# Patient Record
Sex: Female | Born: 1940 | Hispanic: Yes | Smoking: Never smoker
Health system: Southern US, Community
[De-identification: ages and names within clinical notes are randomized; demographics above are authoritative.]

## PROBLEM LIST (undated history)

## (undated) DIAGNOSIS — D649 Anemia, unspecified: Secondary | ICD-10-CM

## (undated) DIAGNOSIS — N189 Chronic kidney disease, unspecified: Secondary | ICD-10-CM

## (undated) DIAGNOSIS — C55 Malignant neoplasm of uterus, part unspecified: Secondary | ICD-10-CM

## (undated) DIAGNOSIS — E119 Type 2 diabetes mellitus without complications: Secondary | ICD-10-CM

## (undated) HISTORY — PX: HIP SURGERY: SHX245

## (undated) HISTORY — DX: Malignant neoplasm of uterus, part unspecified: C55

---

## 2015-07-20 ENCOUNTER — Inpatient Hospital Stay (HOSPITAL_COMMUNITY): Payer: Medicaid Other

## 2015-07-20 ENCOUNTER — Inpatient Hospital Stay (HOSPITAL_COMMUNITY)
Admission: EM | Admit: 2015-07-20 | Discharge: 2015-07-25 | DRG: 690 | Disposition: A | Discharge status: Home / Self Care | Payer: Medicaid Other | Attending: Internal Medicine | Admitting: Internal Medicine

## 2015-07-20 ENCOUNTER — Encounter (HOSPITAL_COMMUNITY): Payer: Self-pay

## 2015-07-20 DIAGNOSIS — N390 Urinary tract infection, site not specified: Principal | ICD-10-CM

## 2015-07-20 DIAGNOSIS — N1330 Unspecified hydronephrosis: Secondary | ICD-10-CM | POA: Diagnosis not present

## 2015-07-20 DIAGNOSIS — R066 Hiccough: Secondary | ICD-10-CM | POA: Diagnosis not present

## 2015-07-20 DIAGNOSIS — A419 Sepsis, unspecified organism: Secondary | ICD-10-CM | POA: Diagnosis present

## 2015-07-20 DIAGNOSIS — R1030 Lower abdominal pain, unspecified: Secondary | ICD-10-CM | POA: Diagnosis present

## 2015-07-20 DIAGNOSIS — Z8542 Personal history of malignant neoplasm of other parts of uterus: Secondary | ICD-10-CM

## 2015-07-20 DIAGNOSIS — B961 Klebsiella pneumoniae [K. pneumoniae] as the cause of diseases classified elsewhere: Secondary | ICD-10-CM | POA: Diagnosis not present

## 2015-07-20 DIAGNOSIS — Z7984 Long term (current) use of oral hypoglycemic drugs: Secondary | ICD-10-CM

## 2015-07-20 DIAGNOSIS — N139 Obstructive and reflux uropathy, unspecified: Secondary | ICD-10-CM

## 2015-07-20 DIAGNOSIS — R319 Hematuria, unspecified: Secondary | ICD-10-CM | POA: Diagnosis present

## 2015-07-20 DIAGNOSIS — N135 Crossing vessel and stricture of ureter without hydronephrosis: Secondary | ICD-10-CM

## 2015-07-20 DIAGNOSIS — D500 Iron deficiency anemia secondary to blood loss (chronic): Secondary | ICD-10-CM | POA: Diagnosis present

## 2015-07-20 DIAGNOSIS — Z880 Allergy status to penicillin: Secondary | ICD-10-CM

## 2015-07-20 DIAGNOSIS — R1900 Intra-abdominal and pelvic swelling, mass and lump, unspecified site: Secondary | ICD-10-CM | POA: Diagnosis present

## 2015-07-20 DIAGNOSIS — B9689 Other specified bacterial agents as the cause of diseases classified elsewhere: Secondary | ICD-10-CM

## 2015-07-20 DIAGNOSIS — E119 Type 2 diabetes mellitus without complications: Secondary | ICD-10-CM | POA: Diagnosis present

## 2015-07-20 DIAGNOSIS — N179 Acute kidney failure, unspecified: Secondary | ICD-10-CM | POA: Diagnosis not present

## 2015-07-20 DIAGNOSIS — Z9221 Personal history of antineoplastic chemotherapy: Secondary | ICD-10-CM | POA: Diagnosis not present

## 2015-07-20 DIAGNOSIS — R1907 Generalized intra-abdominal and pelvic swelling, mass and lump: Secondary | ICD-10-CM | POA: Diagnosis not present

## 2015-07-20 DIAGNOSIS — D649 Anemia, unspecified: Secondary | ICD-10-CM | POA: Diagnosis not present

## 2015-07-20 DIAGNOSIS — N289 Disorder of kidney and ureter, unspecified: Secondary | ICD-10-CM | POA: Diagnosis present

## 2015-07-20 DIAGNOSIS — B957 Other staphylococcus as the cause of diseases classified elsewhere: Secondary | ICD-10-CM | POA: Diagnosis not present

## 2015-07-20 DIAGNOSIS — R7989 Other specified abnormal findings of blood chemistry: Secondary | ICD-10-CM

## 2015-07-20 DIAGNOSIS — R0602 Shortness of breath: Secondary | ICD-10-CM

## 2015-07-20 DIAGNOSIS — R6520 Severe sepsis without septic shock: Secondary | ICD-10-CM

## 2015-07-20 DIAGNOSIS — C801 Malignant (primary) neoplasm, unspecified: Secondary | ICD-10-CM

## 2015-07-20 HISTORY — DX: Type 2 diabetes mellitus without complications: E11.9

## 2015-07-20 LAB — URINE MICROSCOPIC-ADD ON

## 2015-07-20 LAB — FERRITIN: Ferritin: 279 ng/mL (ref 11–307)

## 2015-07-20 LAB — IRON AND TIBC
Iron: 17 ug/dL — ABNORMAL LOW (ref 28–170)
Saturation Ratios: 8 % — ABNORMAL LOW (ref 10.4–31.8)
TIBC: 225 ug/dL — AB (ref 250–450)
UIBC: 208 ug/dL

## 2015-07-20 LAB — CBC WITH DIFFERENTIAL/PLATELET
BASOS PCT: 0 %
Basophils Absolute: 0 10*3/uL (ref 0.0–0.1)
EOS ABS: 0.2 10*3/uL (ref 0.0–0.7)
EOS PCT: 2 %
HCT: 17.2 % — ABNORMAL LOW (ref 36.0–46.0)
HEMOGLOBIN: 5.5 g/dL — AB (ref 12.0–15.0)
LYMPHS ABS: 1.6 10*3/uL (ref 0.7–4.0)
Lymphocytes Relative: 15 %
MCH: 29.7 pg (ref 26.0–34.0)
MCHC: 32 g/dL (ref 30.0–36.0)
MCV: 93 fL (ref 78.0–100.0)
Monocytes Absolute: 0.7 10*3/uL (ref 0.1–1.0)
Monocytes Relative: 7 %
NEUTROS PCT: 76 %
Neutro Abs: 8.1 10*3/uL — ABNORMAL HIGH (ref 1.7–7.7)
PLATELETS: 263 10*3/uL (ref 150–400)
RBC: 1.85 MIL/uL — AB (ref 3.87–5.11)
RDW: 15.6 % — ABNORMAL HIGH (ref 11.5–15.5)
WBC: 10.6 10*3/uL — AB (ref 4.0–10.5)

## 2015-07-20 LAB — URINALYSIS, ROUTINE W REFLEX MICROSCOPIC
BILIRUBIN URINE: NEGATIVE
GLUCOSE, UA: NEGATIVE mg/dL
KETONES UR: NEGATIVE mg/dL
NITRITE: NEGATIVE
PH: 6 (ref 5.0–8.0)
Protein, ur: 100 mg/dL — AB
SPECIFIC GRAVITY, URINE: 1.009 (ref 1.005–1.030)

## 2015-07-20 LAB — DIRECT ANTIGLOBULIN TEST (NOT AT ARMC)
DAT, COMPLEMENT: NEGATIVE
DAT, IgG: NEGATIVE

## 2015-07-20 LAB — COMPREHENSIVE METABOLIC PANEL
ALBUMIN: 2.3 g/dL — AB (ref 3.5–5.0)
ALT: 8 U/L — ABNORMAL LOW (ref 14–54)
AST: 13 U/L — AB (ref 15–41)
Alkaline Phosphatase: 84 U/L (ref 38–126)
Anion gap: 10 (ref 5–15)
BUN: 46 mg/dL — AB (ref 6–20)
CHLORIDE: 105 mmol/L (ref 101–111)
CO2: 17 mmol/L — AB (ref 22–32)
Calcium: 8.3 mg/dL — ABNORMAL LOW (ref 8.9–10.3)
Creatinine, Ser: 3.37 mg/dL — ABNORMAL HIGH (ref 0.44–1.00)
GFR calc Af Amer: 14 mL/min — ABNORMAL LOW (ref >60)
GFR, EST NON AFRICAN AMERICAN: 12 mL/min — AB (ref >60)
GLUCOSE: 228 mg/dL — AB (ref 65–99)
POTASSIUM: 4.7 mmol/L (ref 3.5–5.1)
SODIUM: 132 mmol/L — AB (ref 135–145)
TOTAL PROTEIN: 7.6 g/dL (ref 6.5–8.1)
Total Bilirubin: 0.3 mg/dL (ref 0.3–1.2)

## 2015-07-20 LAB — CBC
HEMATOCRIT: 18.9 % — AB (ref 36.0–46.0)
HEMOGLOBIN: 6.1 g/dL — AB (ref 12.0–15.0)
MCH: 29.9 pg (ref 26.0–34.0)
MCHC: 32.3 g/dL (ref 30.0–36.0)
MCV: 92.6 fL (ref 78.0–100.0)
Platelets: 257 10*3/uL (ref 150–400)
RBC: 2.04 MIL/uL — ABNORMAL LOW (ref 3.87–5.11)
RDW: 15.4 % (ref 11.5–15.5)
WBC: 9 10*3/uL (ref 4.0–10.5)

## 2015-07-20 LAB — PREPARE RBC (CROSSMATCH)

## 2015-07-20 LAB — PROTIME-INR
INR: 1.34 (ref 0.00–1.49)
PROTHROMBIN TIME: 16.7 s — AB (ref 11.6–15.2)

## 2015-07-20 LAB — VITAMIN B12: Vitamin B-12: 421 pg/mL (ref 180–914)

## 2015-07-20 LAB — GLUCOSE, CAPILLARY
GLUCOSE-CAPILLARY: 105 mg/dL — AB (ref 65–99)
Glucose-Capillary: 124 mg/dL — ABNORMAL HIGH (ref 65–99)

## 2015-07-20 LAB — ABO/RH: ABO/RH(D): O POS

## 2015-07-20 LAB — LACTATE DEHYDROGENASE: LDH: 138 U/L (ref 98–192)

## 2015-07-20 LAB — I-STAT CG4 LACTIC ACID, ED: LACTIC ACID, VENOUS: 1.69 mmol/L (ref 0.5–2.0)

## 2015-07-20 LAB — RETICULOCYTES
RBC.: 1.85 MIL/uL — AB (ref 3.87–5.11)
RETIC CT PCT: 2.7 % (ref 0.4–3.1)
Retic Count, Absolute: 50 10*3/uL (ref 19.0–186.0)

## 2015-07-20 LAB — SAVE SMEAR

## 2015-07-20 LAB — LIPASE, BLOOD: LIPASE: 27 U/L (ref 11–51)

## 2015-07-20 LAB — FOLATE: Folate: 23 ng/mL (ref >5.9)

## 2015-07-20 MED ORDER — DEXTROSE 5 % IV SOLN: 2.0000 g | Freq: Once | INTRAVENOUS | Status: DC

## 2015-07-20 MED ORDER — INSULIN ASPART 100 UNIT/ML ~~LOC~~ SOLN
0.0000 [IU] | Freq: Three times a day (TID) | SUBCUTANEOUS | Status: DC
  Administered 2015-07-21: 2 [IU] via SUBCUTANEOUS
  Administered 2015-07-21: 3 [IU] via SUBCUTANEOUS
  Administered 2015-07-22: 1 [IU] via SUBCUTANEOUS
  Administered 2015-07-22 – 2015-07-23 (×2): 2 [IU] via SUBCUTANEOUS
  Administered 2015-07-23: 1 [IU] via SUBCUTANEOUS
  Administered 2015-07-24: 3 [IU] via SUBCUTANEOUS
  Administered 2015-07-24 (×2): 2 [IU] via SUBCUTANEOUS
  Administered 2015-07-25: 1 [IU] via SUBCUTANEOUS
  Administered 2015-07-25: 5 [IU] via SUBCUTANEOUS

## 2015-07-20 MED ORDER — SODIUM CHLORIDE 0.9 % IV BOLUS (SEPSIS)
1000.0000 mL | Freq: Once | INTRAVENOUS | Status: AC
  Administered 2015-07-20: 1000 mL via INTRAVENOUS

## 2015-07-20 MED ORDER — INSULIN ASPART 100 UNIT/ML ~~LOC~~ SOLN: 0.0000 [IU] | Freq: Three times a day (TID) | SUBCUTANEOUS | Status: DC

## 2015-07-20 MED ORDER — POLYETHYLENE GLYCOL 3350 17 G PO PACK
17.0000 g | PACK | Freq: Every day | ORAL | Status: DC
  Administered 2015-07-21 – 2015-07-25 (×5): 17 g via ORAL
  Filled 2015-07-20 (×5): qty 1

## 2015-07-20 MED ORDER — SODIUM CHLORIDE 0.9 % IV SOLN
10.0000 mL/h | Freq: Once | INTRAVENOUS | Status: DC
Start: 2015-07-20 — End: 2015-07-25

## 2015-07-20 MED ORDER — CEFTRIAXONE SODIUM 1 G IJ SOLR
1.0000 g | INTRAMUSCULAR | Status: DC
  Administered 2015-07-20 – 2015-07-22 (×3): 1 g via INTRAVENOUS
  Filled 2015-07-20 (×4): qty 10

## 2015-07-20 MED ORDER — SODIUM CHLORIDE 0.9 % IV BOLUS (SEPSIS): 500.0000 mL | INTRAVENOUS | Status: AC

## 2015-07-20 NOTE — ED Notes (Signed)
HGb 6.1 notified at triage by lab

## 2015-07-20 NOTE — Progress Notes (Signed)
Called ED for report. Spoke with [employer].

## 2015-07-20 NOTE — ED Notes (Signed)
Code sepsis paged out @ 1642. Spoke w/[facility].

## 2015-07-20 NOTE — ED Notes (Signed)
Patient here with lower abdominal pain and dysuria x 1 week. No vomiting, last BM yesaterday

## 2015-07-20 NOTE — Progress Notes (Signed)
Patient arrived on unit via stretcher from ED.  Granddaughter at bedside.  Telemetry placed per MD order and CMT notified.

## 2015-07-20 NOTE — ED Provider Notes (Signed)
CSN: [id number]     Arrival date & time 07/20/15  1500 History   First MD Initiated Contact with Patient 07/20/15 1614     Chief Complaint  Patient presents with  . Abdominal Pain     (Consider location/radiation/quality/duration/timing/severity/associated sxs/prior Treatment) Patient is a 75 y.o. female presenting with abdominal pain. The history is provided by the patient.  Abdominal Pain Pain location:  Suprapubic Pain quality: aching   Pain radiates to:  Does not radiate Pain severity:  Moderate Onset quality:  Gradual Duration:  1 week Timing:  Constant Progression:  Worsening Chronicity:  New Context comment:  Urinary frequency for 3 weeks Relieved by:  Nothing Worsened by:  Nothing tried Ineffective treatments:  None tried Associated symptoms: chills, fatigue and nausea   Risk factors: being elderly     Past Medical History  Diagnosis Date  . Diabetes mellitus without complication ([place])    History reviewed. No pertinent past surgical history. No family history on file. Social History  Substance Use Topics  . Smoking status: Never Smoker   . Smokeless tobacco: None  . Alcohol Use: None   OB History    No data available     Review of Systems  Constitutional: Positive for chills and fatigue.  Gastrointestinal: Positive for nausea and abdominal pain.  All other systems reviewed and are negative.     Allergies  Penicillins  Home Medications   Prior to Admission medications   Medication Sig Start Date End Date Taking? Authorizing Provider  glyBURIDE-metformin (GLUCOVANCE) 5-500 MG tablet Take 1 tablet by mouth 2 (two) times daily as needed (for high blood sugar). Blood sugar > 150 then pt takes, if < 95 pt does not take   Yes Historical Provider, MD   BP 151/59 mmHg  Pulse 95  Temp(Src) 100.8 F (38.2 C) (Rectal)  Resp 17  Ht 5' (1.524 m)  Wt 100 lb (45.36 kg)  BMI 19.53 kg/m2  SpO2 100% Physical Exam  Constitutional: She is oriented to person,  place, and time. She appears well-developed and well-nourished. No distress.  HENT:  Head: Normocephalic.  Eyes: Conjunctivae are normal.  Neck: Neck supple. No tracheal deviation present.  Cardiovascular: Normal rate, regular rhythm and normal heart sounds.   Pulmonary/Chest: Effort normal and breath sounds normal. No respiratory distress.  Abdominal: Soft. She exhibits no distension. There is tenderness. There is no rebound and no guarding.  Neurological: She is alert and oriented to person, place, and time.  Skin: Skin is warm and dry. There is pallor.  Psychiatric: She has a normal mood and affect.  Vitals reviewed.   ED Course  Procedures (including critical care time)  CRITICAL CARE Performed by: Leo Grosser Total critical care time: 30 minutes Critical care time was exclusive of separately billable procedures and treating other patients. Critical care was necessary to treat or prevent imminent or life-threatening deterioration. Critical care was time spent personally by me on the following activities: development of treatment plan with patient and/or surrogate as well as nursing, discussions with consultants, evaluation of patient's response to treatment, examination of patient, obtaining history from patient or surrogate, ordering and performing treatments and interventions, ordering and review of laboratory studies, ordering and review of radiographic studies, pulse oximetry and re-evaluation of patient's condition.   Labs Review Labs Reviewed  COMPREHENSIVE METABOLIC PANEL - Abnormal; Notable for the following:    Sodium 132 (*)    CO2 17 (*)    Glucose, Bld 228 (*)  BUN 46 (*)    Creatinine, Ser 3.37 (*)    Calcium 8.3 (*)    Albumin 2.3 (*)    AST 13 (*)    ALT 8 (*)    GFR calc non Af Amer 12 (*)    GFR calc Af Amer 14 (*)    All other components within normal limits  CBC - Abnormal; Notable for the following:    RBC 2.04 (*)    Hemoglobin 6.1 (*)    HCT  18.9 (*)    All other components within normal limits  URINALYSIS, ROUTINE W REFLEX MICROSCOPIC (NOT AT [facility]) - Abnormal; Notable for the following:    APPearance TURBID (*)    Hgb urine dipstick MODERATE (*)    Protein, ur 100 (*)    Leukocytes, UA LARGE (*)    All other components within normal limits  URINE MICROSCOPIC-ADD ON - Abnormal; Notable for the following:    Squamous Epithelial / LPF 0-5 (*)    Bacteria, UA MANY (*)    All other components within normal limits  CULTURE, BLOOD (ROUTINE X 2)  CULTURE, BLOOD (ROUTINE X 2)  URINE CULTURE  LIPASE, BLOOD  CBC WITH DIFFERENTIAL/PLATELET  VITAMIN B12  FOLATE  IRON AND TIBC  FERRITIN  RETICULOCYTES  I-STAT CG4 LACTIC ACID, ED  I-STAT CG4 LACTIC ACID, ED  TYPE AND SCREEN  PREPARE RBC (CROSSMATCH)    Imaging Review US Renal  07/21/2015  CLINICAL DATA:  Elevated serum creatinine EXAM: RENAL / URINARY TRACT ULTRASOUND COMPLETE COMPARISON:  None. FINDINGS: Right Kidney: Length: 11.5 cm.  Hydronephrosis.  Mild cortical thinning. Left Kidney: Length: 12 cm.  Hydronephrosis.  Mild cortical thinning. Bladder: Distended with internal debris. No detectable wall thickening. Postvoid bladder volume is essentially unchanged. These results were called by telephone at the time of interpretation on 07/21/2015 at 12:37 am to Dr. Merrilyn Puma , who verbally acknowledged these results. IMPRESSION: 1. Bilateral hydronephrosis and distended bladder compatible with obstructive uropathy. 2. Debris in the bladder, usually infectious. Electronically Signed   By: Monte Fantasia M.D.   On: 07/21/2015 00:43   I have personally reviewed and evaluated these images and lab results as part of my medical decision-making.   EKG Interpretation None      MDM   Final diagnoses:  Severe sepsis (HCC)  Urinary tract infection with hematuria, site unspecified  Symptomatic anemia    75 y.o. female presents with Abdominal pain that started around a week ago with  preceding urinary frequency and odor. She is febrile, tachycardic on arrival and is moderately ill-appearing. Patient meets clinical criteria for severe sepsis with at least 2 SIRS, evidence of end organ damage and/or lactic acid elevation and suspected source of urinary tract infection. Responsive to fluid resuscitation. She is also severely anemic which is likely exacerbating her end organ damage in the setting of acute infection. She was fluid resuscitated and provided IV antibiotics as well as blood products in the emergency department for resuscitation. Internal medicine was consulted for admission and will see the patient in the emergency department.     Leo Grosser, MD 07/21/15 [phone]

## 2015-07-20 NOTE — H&P (Signed)
Date: 07/20/2015               Patient Name:  Heidi [name] MRN: [id number]  DOB: [birth year] Age / Sex: 75 y.o., female   PCP: No Pcp Per Patient         Medical Service: Internal Medicine Teaching Service         Attending Physician: Dr. Aldine Contes, MD    First Contact: Dr. Loleta Chance Pager: [phone]  Second Contact: Dr. Charlott Rakes Pager: [phone]       After Hours (After 5p/  First Contact Pager: [phone]  weekends / holidays): Second Contact Pager: [phone]   Chief Complaint: Suprapubic pain, dysuria, fever  History of Present Illness:  Heidi [name] is a 75 year old [place] lady who recently moved to the [place] in November 2016 with type 2 diabetes but no other known medical problems presenting with suprapubic abdominal pain, dysuria, and fever.  Last week, she began feeling suprapubic abdominal pain and dysuria; this was followed by fever and chills. She denies flank pain. She has had numerous urinary tract infections over the last 20 years in [place]. Additionally, she was transfused blood some time last Summer before moving to the [place] but she does not know why. She has been losing weight for the last few years but is not sure why. She denies any epigastric abdominal pain, melena, hematochezia, early satiety, night sweats, lymphadenopathy, bone pain, or cough; review of systems was otherwise non-revealing.  Meds: Current Facility-Administered Medications  Medication Dose Route Frequency Provider Last Rate Last Dose  . 0.9 %  sodium chloride infusion  10 mL/hr Intravenous Once Leo Grosser, MD      . cefTRIAXone (ROCEPHIN) 1 g in dextrose 5 % 50 mL IVPB  1 g Intravenous Q24H Rebecka Apley, RPH 100 mL/hr at 07/20/15 1723 1 g at 07/20/15 1723  . sodium chloride 0.9 % bolus 1,000 mL  1,000 mL Intravenous Once Leo Grosser, MD 1,000 mL/hr at 07/20/15 1733 1,000 mL at 07/20/15 1733   Followed by  . sodium chloride 0.9 % bolus 500 mL  500 mL  Intravenous Q1H Leo Grosser, MD       Current Outpatient Prescriptions  Medication Sig Dispense Refill  . glyBURIDE-metformin (GLUCOVANCE) 5-500 MG tablet Take 1 tablet by mouth 2 (two) times daily as needed (for high blood sugar). Blood sugar > 150 then pt takes, if < 95 pt does not take      Allergies: Allergies as of 07/20/2015 - Review Complete 07/20/2015  Allergen Reaction Noted  . Penicillins Itching, Nausea And Vomiting, and Rash 07/20/2015   Past Medical History  Diagnosis Date  . Diabetes mellitus without complication ([place])    History reviewed. No pertinent past surgical history. No family history on file. Social History   Social History  . Marital Status: Widowed    Spouse Name: N/A  . Number of Children: N/A  . Years of Education: N/A   Occupational History  . Not on file.   Social History Main Topics  . Smoking status: Never Smoker   . Smokeless tobacco: Not on file  . Alcohol Use: Not on file  . Drug Use: Not on file  . Sexual Activity: Not on file   Other Topics Concern  . Not on file   Social History Narrative  . No narrative on file  She raised 11 children and did not work in [place]. She recently moved to [place] in November 2016  and lives with her daughter.  Review of Systems: Per HPI  Physical Exam: Blood pressure 151/59, pulse 95, temperature 100.8 F (38.2 C), temperature source Rectal, resp. rate 17, height 5' (1.524 m), weight 100 lb (45.36 kg), SpO2 100 %. General: well-appearing, friendly [place] lady resting in bed comfortably HEENT: pale conjuctiva, extra-ocular muscles intact, oropharynx without lesions Cardiac: regular rate and rhythm, no rubs, murmurs or gallops Pulm: breathing well, clear to auscultation bilaterally Abd: bowel sounds normal, soft, nondistended, suprapubic abdominal tenderness, no CVA tenderness Ext: warm and well perfused, without pedal edema Lymph: no cervical or supraclavicular lymphadenopathy Skin: half  and half nails; no other skin lesions Neuro: alert and oriented X3, cranial nerves II-XII grossly intact, moving all extremities well  Lab results: Basic Metabolic Panel:  Recent Labs  07/20/15 1519  NA 132*  K 4.7  CL 105  CO2 17*  GLUCOSE 228*  BUN 46*  CREATININE 3.37*  CALCIUM 8.3*   Liver Function Tests:  Recent Labs  07/20/15 1519  AST 13*  ALT 8*  ALKPHOS 84  BILITOT 0.3  PROT 7.6  ALBUMIN 2.3*    Recent Labs  07/20/15 1519  LIPASE 27   CBC:  Recent Labs  07/20/15 1519 07/20/15 1735  WBC 9.0 10.6*  NEUTROABS  --  8.1*  HGB 6.1* 5.5*  HCT 18.9* 17.2*  MCV 92.6 93.0  PLT 257 263   Anemia Panel:  Recent Labs  07/20/15 1735  RETICCTPCT 2.7   Urinalysis:  Recent Labs  07/20/15 1518  COLORURINE YELLOW  LABSPEC 1.009  PHURINE 6.0  GLUCOSEU NEGATIVE  HGBUR MODERATE*  BILIRUBINUR NEGATIVE  KETONESUR NEGATIVE  PROTEINUR 100*  NITRITE NEGATIVE  LEUKOCYTESUR LARGE*   Assessment & Plan by Problem:  Heidi [name] is a friendly 75 year old [place] lady with type 2 diabetes presenting with a complicated urinary tract infection and normocytic anemia.  Complicated urinary tract infection: She does not appear to have pyelonephritis nor is she septic. We'll treat with IV ceftriaxone and follow cultures. -Continue IV ceftriaxone -Follow urine cultures  Normocytic anemia: Her hemoglobin on admission was 6.1 with a normal MCV. We'll check iron and hemolysis labs. This could be primary renal disease given her half and half nails. Another consideration is multiple myeloma given her concomitant renal disease, but she's not hypercalcemic. -Ordered LDH, haptoglobin, Coomb's -Ordered SPEP, UPEP, IFE -Ordered FOBT -CBC tomorrow  Renal insufficiency: I suspect this is likely acute on chronic renal insufficiency given her elevated BUN suggestive of pre-renal insufficiency, but I'm unsure of her baseline creatinine. She was re-hydrated in the  emergency department. We'll follow her BMP tomorrow. -BMP tomorrow -Ordered renal ultrasound  Dispo: Disposition is deferred at this time, awaiting improvement of current medical problems. Anticipated discharge in approximately 2-5 day(s).   The patient does have a current PCP (No Pcp Per Patient) and does need an [facility] hospital follow-up appointment after discharge.  The patient does have transportation limitations that hinder transportation to clinic appointments.  Signed: Loleta Chance, MD 07/20/2015, 6:22 PM

## 2015-07-21 ENCOUNTER — Inpatient Hospital Stay (HOSPITAL_COMMUNITY): Payer: Medicaid Other

## 2015-07-21 DIAGNOSIS — N390 Urinary tract infection, site not specified: Secondary | ICD-10-CM | POA: Diagnosis present

## 2015-07-21 DIAGNOSIS — D649 Anemia, unspecified: Secondary | ICD-10-CM | POA: Insufficient documentation

## 2015-07-21 DIAGNOSIS — N179 Acute kidney failure, unspecified: Secondary | ICD-10-CM

## 2015-07-21 DIAGNOSIS — N1330 Unspecified hydronephrosis: Secondary | ICD-10-CM

## 2015-07-21 DIAGNOSIS — R319 Hematuria, unspecified: Secondary | ICD-10-CM

## 2015-07-21 LAB — CBC
HCT: 29.2 % — ABNORMAL LOW (ref 36.0–46.0)
Hemoglobin: 9.7 g/dL — ABNORMAL LOW (ref 12.0–15.0)
MCH: 29.5 pg (ref 26.0–34.0)
MCHC: 33.2 g/dL (ref 30.0–36.0)
MCV: 88.8 fL (ref 78.0–100.0)
PLATELETS: 219 10*3/uL (ref 150–400)
RBC: 3.29 MIL/uL — AB (ref 3.87–5.11)
RDW: 14.6 % (ref 11.5–15.5)
WBC: 13 10*3/uL — ABNORMAL HIGH (ref 4.0–10.5)

## 2015-07-21 LAB — HAPTOGLOBIN: HAPTOGLOBIN: 394 mg/dL — AB (ref 34–200)

## 2015-07-21 LAB — BASIC METABOLIC PANEL
Anion gap: 11 (ref 5–15)
BUN: 40 mg/dL — AB (ref 6–20)
CALCIUM: 8.4 mg/dL — AB (ref 8.9–10.3)
CHLORIDE: 111 mmol/L (ref 101–111)
CO2: 16 mmol/L — AB (ref 22–32)
CREATININE: 3.22 mg/dL — AB (ref 0.44–1.00)
GFR calc non Af Amer: 13 mL/min — ABNORMAL LOW (ref >60)
GFR, EST AFRICAN AMERICAN: 15 mL/min — AB (ref >60)
Glucose, Bld: 136 mg/dL — ABNORMAL HIGH (ref 65–99)
Potassium: 4.5 mmol/L (ref 3.5–5.1)
Sodium: 138 mmol/L (ref 135–145)

## 2015-07-21 LAB — GLUCOSE, CAPILLARY
GLUCOSE-CAPILLARY: 115 mg/dL — AB (ref 65–99)
Glucose-Capillary: 197 mg/dL — ABNORMAL HIGH (ref 65–99)
Glucose-Capillary: 217 mg/dL — ABNORMAL HIGH (ref 65–99)
Glucose-Capillary: 107 mg/dL — ABNORMAL HIGH (ref 65–99)

## 2015-07-21 MED ORDER — VANCOMYCIN HCL IN DEXTROSE 750-5 MG/150ML-% IV SOLN
750.0000 mg | Freq: Once | INTRAVENOUS | Status: AC
Start: 2015-07-21 — End: 2015-07-22
  Administered 2015-07-21: 750 mg via INTRAVENOUS
  Filled 2015-07-21: qty 150

## 2015-07-21 MED ORDER — PNEUMOCOCCAL VAC POLYVALENT 25 MCG/0.5ML IJ INJ
0.5000 mL | INJECTION | INTRAMUSCULAR | Status: AC
  Administered 2015-07-23: 0.5 mL via INTRAMUSCULAR
  Filled 2015-07-21: qty 0.5

## 2015-07-21 MED ORDER — SODIUM CHLORIDE 0.9 % IV SOLN
510.0000 mg | Freq: Once | INTRAVENOUS | Status: AC
  Administered 2015-07-21: 510 mg via INTRAVENOUS
  Filled 2015-07-21: qty 17

## 2015-07-21 MED ORDER — VANCOMYCIN HCL 500 MG IV SOLR
500.0000 mg | INTRAVENOUS | Status: DC
  Filled 2015-07-21: qty 500

## 2015-07-21 NOTE — Progress Notes (Signed)
PT Cancellation Note  Patient Details Name: Heidi [name] MRN: [id number] DOB: 1941-03-12   Cancelled Treatment:    Reason Eval/Treat Not Completed: Fatigue/lethargy limiting ability to participate.  Patient sleeping.  Family requests we return at later time for PT evaluation.   [name] 07/21/2015, 6:37 PM Carita Pian. Sanjuana Kava, [place] Pager [phone]

## 2015-07-21 NOTE — Progress Notes (Signed)
Patient ID: Heidi [name], female   DOB: 06/12/1940, 75 y.o.   MRN: [id number]   Subjective: Heidi [name] dysuria and suprapubic abdominal pain have improved today. She reportedly had a D&C in [place] a few years ago but denies any further vaginal bleeding. She has no complaints.  Objective: Vital signs in last 24 hours: Filed Vitals:   07/21/15 0434 07/21/15 0500 07/21/15 0515 07/21/15 0806  BP: 146/62 144/70 147/56 157/69  Pulse: 89 90 88 88  Temp: 100 F (37.8 C) 100 F (37.8 C) 99.7 F (37.6 C) 99 F (37.2 C)  TempSrc: Oral Oral Oral Oral  Resp: 16 18 16 16   Height:      Weight:      SpO2: 98% 98% 98% 98%   General: well-appearing, friendly [place] lady resting in bed comfortably HEENT: pale conjuctiva, extra-ocular muscles intact, oropharynx without lesions Cardiac: regular rate and rhythm, no rubs, murmurs or gallops Pulm: breathing well, clear to auscultation bilaterally Abd: bowel sounds normal, soft, nondistended, suprapubic abdominal tenderness, no CVA tenderness Ext: warm and well perfused, without pedal edema Lymph: no cervical or supraclavicular lymphadenopathy Skin: half and half nails; no other skin lesions Neuro: alert and oriented X3, cranial nerves II-XII grossly intact, moving all extremities well  Lab Results: Basic Metabolic Panel:  Recent Labs Lab 07/20/15 1519 07/21/15 0841  NA 132* 138  K 4.7 4.5  CL 105 111  CO2 17* 16*  GLUCOSE 228* 136*  BUN 46* 40*  CREATININE 3.37* 3.22*  CALCIUM 8.3* 8.4*   CBC:  Recent Labs Lab 07/20/15 1735 07/21/15 0841  WBC 10.6* 13.0*  NEUTROABS 8.1*  --   HGB 5.5* 9.7*  HCT 17.2* 29.2*  MCV 93.0 88.8  PLT 263 219   Anemia Panel:  Recent Labs Lab 07/20/15 1735 07/20/15 1736  VITAMINB12 421  --   FOLATE  --  23.0  FERRITIN 279  --   TIBC 225*  --   IRON 17*  --   RETICCTPCT 2.7  --    Studies/Results: US Renal  07/21/2015  CLINICAL DATA:  Elevated serum creatinine EXAM: RENAL /  URINARY TRACT ULTRASOUND COMPLETE COMPARISON:  None. FINDINGS: Right Kidney: Length: 11.5 cm.  Hydronephrosis.  Mild cortical thinning. Left Kidney: Length: 12 cm.  Hydronephrosis.  Mild cortical thinning. Bladder: Distended with internal debris. No detectable wall thickening. Postvoid bladder volume is essentially unchanged. These results were called by telephone at the time of interpretation on 07/21/2015 at 12:37 am to Dr. Merrilyn Puma , who verbally acknowledged these results. IMPRESSION: 1. Bilateral hydronephrosis and distended bladder compatible with obstructive uropathy. 2. Debris in the bladder, usually infectious. Electronically Signed   By: Monte Fantasia M.D.   On: 07/21/2015 00:43   Medications: I have reviewed the patient's current medications. Scheduled Meds: . sodium chloride  10 mL/hr Intravenous Once  . cefTRIAXone (ROCEPHIN)  IV  1 g Intravenous Q24H  . insulin aspart  0-9 Units Subcutaneous TID WC  . [START ON 07/22/2015] pneumococcal 23 valent vaccine  0.5 mL Intramuscular Tomorrow-1000  . polyethylene glycol  17 g Oral Daily   Continuous Infusions:  PRN Meds:.  Assessment/Plan:  Heidi [name] is a friendly 75 year old [place] lady with type 2 diabetes presenting with a complicated urinary tract infection, normocytic anemia, and obstructive uropathy.  Complicated urinary tract infection: She is improving with IV ceftriaxone. -Continue IV ceftriaxone -Follow urine cultures  Normocytic anemia: Her hemoglobin corrected appropriately from 6 to 9 after she received 3U PRBCs  so I doubt hemolysis. She appears to be iron-deficient suggestive of chronic bleeding so we'll check an FOBT. She also seems to have had endometrial cancer in the past but denies any vaginal bleeding. Given her history of cancer and obstructive uropathy, we'll get a CT abd/pelvis without contrast today to look for malignancy. Another consideration is multiple myeloma given her concomitant renal disease, but  she's not hypercalcemic; we'll send MM work-up labs. Her anemia could be from primary renal disease given her half and half nails but the degree of anemia is odd and demands further work-up per above. -Follow SPEP, UPEP, IFE -Follow FOBT -Follow CT abdomen and pelvis -CBC tomorrow  Renal insufficiency with bilateral hydronephrosis: This could be from infectious debris from her urinary tract infection or malignancy. We'll get an abdominal/pelvis CT without contrast to get more information and we've placed a Foley catheter to decompress. -Foley catheter placed -BMP tomorrow  Dispo: Disposition is deferred at this time, awaiting improvement of current medical problems.  Anticipated discharge in approximately 2-3 day(s).   The patient does not have a current PCP (No Pcp Per Patient) and does need an [facility] hospital follow-up appointment after discharge.  The patient does have transportation limitations that hinder transportation to clinic appointments.  .Services Needed at time of discharge: Y = Yes, Blank = No PT:   OT:   RN:   Equipment:   Other:     LOS: 1 day   Heidi Chance, MD 07/21/2015, 11:56 AM

## 2015-07-21 NOTE — Progress Notes (Signed)
Foley catheter inserted per MD order.  Urine return.  Patient tolerated well.

## 2015-07-21 NOTE — Progress Notes (Signed)
Pharmacy Antibiotic Note  Heidi [name] is a 75 y.o. female admitted on 07/20/2015 with UTI.  Pharmacy has been consulted for Vancomycin dosing for bacteremia. 1/2 + blood cultures with gram + cocci in clusters, WBC rising some, either in acute or acute on chronic renal failure, baseline Scr unknown.   Plan: -Vancomycin 750 mg IV x 1, then 500 mg IV q48h -Already on Ceftriaxone for UTI -Trend WBC, temp, renal function -Drug levels as indicated  -F/U urine/blood cultures for directed therapy, likely to be same organism   Height: 5' (152.4 cm) Weight: 100 lb 6.4 oz (45.541 kg) IBW/kg (Calculated) : 45.5  Temp (24hrs), Avg:99.3 F (37.4 C), Min:98.3 F (36.8 C), Max:100 F (37.8 C)   Recent Labs Lab 07/20/15 1519 07/20/15 1658 07/20/15 1735 07/21/15 0841  WBC 9.0  --  10.6* 13.0*  CREATININE 3.37*  --   --  3.22*  LATICACIDVEN  --  1.69  --   --     Estimated Creatinine Clearance: 10.8 mL/min (by C-G formula based on Cr of 3.22).    Allergies  Allergen Reactions  . Penicillins Itching, Nausea And Vomiting and Rash    Has patient had a PCN reaction causing immediate rash, facial/tongue/throat swelling, SOB or lightheadedness with hypotension: Yes Has patient had a PCN reaction causing severe rash involving mucus membranes or skin necrosis: No Has patient had a PCN reaction that required hospitalization No Has patient had a PCN reaction occurring within the last 10 years: No If all of the above answers are "NO", then may proceed with Cephalosporin use.     Narda Bonds 07/21/2015 11:07 PM

## 2015-07-22 DIAGNOSIS — R1907 Generalized intra-abdominal and pelvic swelling, mass and lump: Secondary | ICD-10-CM

## 2015-07-22 DIAGNOSIS — N139 Obstructive and reflux uropathy, unspecified: Secondary | ICD-10-CM | POA: Insufficient documentation

## 2015-07-22 LAB — PROTEIN ELECTROPHORESIS, SERUM
A/G RATIO SPE: 0.5 — AB (ref 0.7–1.7)
ALBUMIN ELP: 2.7 g/dL — AB (ref 2.9–4.4)
ALPHA-1-GLOBULIN: 0.4 g/dL (ref 0.0–0.4)
ALPHA-2-GLOBULIN: 1 g/dL (ref 0.4–1.0)
Beta Globulin: 1.1 g/dL (ref 0.7–1.3)
GLOBULIN, TOTAL: 5.1 g/dL — AB (ref 2.2–3.9)
Gamma Globulin: 2.5 g/dL — ABNORMAL HIGH (ref 0.4–1.8)
Total Protein ELP: 7.8 g/dL (ref 6.0–8.5)

## 2015-07-22 LAB — TYPE AND SCREEN
ABO/RH(D): O POS
Antibody Screen: NEGATIVE
UNIT DIVISION: 0
UNIT DIVISION: 0

## 2015-07-22 LAB — BASIC METABOLIC PANEL
ANION GAP: 9 (ref 5–15)
BUN: 35 mg/dL — ABNORMAL HIGH (ref 6–20)
CALCIUM: 8.7 mg/dL — AB (ref 8.9–10.3)
CO2: 18 mmol/L — AB (ref 22–32)
Chloride: 110 mmol/L (ref 101–111)
Creatinine, Ser: 2.97 mg/dL — ABNORMAL HIGH (ref 0.44–1.00)
GFR calc Af Amer: 17 mL/min — ABNORMAL LOW (ref >60)
GFR calc non Af Amer: 14 mL/min — ABNORMAL LOW (ref >60)
GLUCOSE: 86 mg/dL (ref 65–99)
Potassium: 4.3 mmol/L (ref 3.5–5.1)
Sodium: 137 mmol/L (ref 135–145)

## 2015-07-22 LAB — CBC
HEMATOCRIT: 28.2 % — AB (ref 36.0–46.0)
Hemoglobin: 9.5 g/dL — ABNORMAL LOW (ref 12.0–15.0)
MCH: 29.5 pg (ref 26.0–34.0)
MCHC: 33.7 g/dL (ref 30.0–36.0)
MCV: 87.6 fL (ref 78.0–100.0)
Platelets: 225 10*3/uL (ref 150–400)
RBC: 3.22 MIL/uL — AB (ref 3.87–5.11)
RDW: 15.1 % (ref 11.5–15.5)
WBC: 9.3 10*3/uL (ref 4.0–10.5)

## 2015-07-22 LAB — GLUCOSE, CAPILLARY
GLUCOSE-CAPILLARY: 154 mg/dL — AB (ref 65–99)
Glucose-Capillary: 96 mg/dL (ref 65–99)
Glucose-Capillary: 123 mg/dL — ABNORMAL HIGH (ref 65–99)

## 2015-07-22 NOTE — Progress Notes (Signed)
Patient ID: Heidi [name], female   DOB: 05/20/1940, 74 y.o.   MRN: [id number]   Subjective: Heidi [name]'s dysuria and suprapubic abdominal pain have improved today. Able to sit up in chair today for the first time without discomfort. Denies any vaginal bleeding or hematuria. Heidi [name] has no complaints.  Objective: Vital signs in last 24 hours: Filed Vitals:   07/21/15 1817 07/21/15 2159 07/22/15 0657 07/22/15 0937  BP: 147/55 143/60 135/60 138/56  Pulse: 88 86 78 77  Temp: 98.6 F (37 C) 100 F (37.8 C) 97.8 F (36.6 C) 98.4 F (36.9 C)  TempSrc: Oral Oral Oral Oral  Resp: 16 16 16 18  Height:      Weight:      SpO2: 97% 97% 97% 98%   General: elderly, friendly [place] lady resting in bed comfortably HEENT: pale conjuctiva, extra-ocular muscles intact, oropharynx without lesions Cardiac: regular rate and rhythm, no rubs, murmurs or gallops Pulm: breathing well, clear to auscultation bilaterally Abd: bowel sounds normal, soft, nondistended, suprapubic abdominal tenderness, no CVA tenderness Ext: warm and well perfused, without pedal edema Lymph: no cervical or supraclavicular lymphadenopathy Skin: half and half nails; no other skin lesions Neuro: alert and oriented X3, cranial nerves II-XII grossly intact, moving all extremities well  Lab Results: Basic Metabolic Panel:  Recent Labs Lab 07/21/15 0841 07/22/15 0539  NA 138 137  K 4.5 4.3  CL 111 110  CO2 16* 18*  GLUCOSE 136* 86  BUN 40* 35*  CREATININE 3.22* 2.97*  CALCIUM 8.4* 8.7*   CBC:  Recent Labs Lab 07/20/15 1735 07/21/15 0841 07/22/15 0539  WBC 10.6* 13.0* 9.3  NEUTROABS 8.1*  --   --   HGB 5.5* 9.7* 9.5*  HCT 17.2* 29.2* 28.2*  MCV 93.0 88.8 87.6  PLT 263 219 225   Anemia Panel:  Recent Labs Lab 07/20/15 1735 07/20/15 1736  VITAMINB12 421  --   FOLATE  --  23.0  FERRITIN 279  --   TIBC 225*  --   IRON 17*  --   RETICCTPCT 2.7  --    Studies/Results: Ct Abdomen Pelvis Wo  Contrast  07/21/2015  CLINICAL DATA:  Obstructive uropathy, hematuria. History of endometrial cancer. EXAM: CT ABDOMEN AND PELVIS WITHOUT CONTRAST TECHNIQUE: Multidetector CT imaging of the abdomen and pelvis was performed following the standard protocol without IV contrast. COMPARISON:  None. FINDINGS: Study is limited by the lack of intravascular contrast. Lower chest:  There is mild bibasilar atelectasis. Hepatobiliary: No mass visualized within the liver on this un-enhanced exam. Stones are seen within the contracted gallbladder but there is no pericholecystic fluid or other secondary signs of an acute cholecystitis. No bile duct dilatation appreciated. Pancreas: Diminutive but otherwise unremarkable. No peripancreatic fluid. Spleen: Within normal limits in size. Adrenals/Urinary Tract: There is bilateral hydronephrosis, moderate to severe in degree, and bilateral hydroureter. There is irregular soft tissue thickening/mass within the pelvis (posterior and above the level of the bladder) which is the likely source of the bilateral ureteral obstruction. Bladder is decompressed by Foley catheter. Stomach/Bowel: No dilated large or small bowel loops. Appendix is not seen. Stomach appears normal. Vascular/Lymphatic: Heavy atherosclerotic changes are seen along the walls of the normal- caliber abdominal aorta and throughout the pelvic vasculature. Reproductive: Soft tissue mass/thickening within the pelvis may be related to the uterus given the provided history of endometrial cancer. This soft tissue mass/thickening extends into the bilateral adnexal regions. Normal structures are not clearly delineated. Multiple surgical clips are   seen within the pelvis. Other: No free fluid or abscess collection identified. No free intraperitoneal air seen. Musculoskeletal: Degenerative changes of the thoracolumbar spine and degenerative changes within the pelvis. No acute or suspicious osseous lesion. IMPRESSION: 1. Bilateral  hydronephrosis, moderate-to-severe in degree, favored to be chronic given the lack of significant perinephric inflammation. Poorly defined soft tissue mass/thickening within the pelvis, surrounding the bladder and within the bilateral adnexal regions, difficult to definitively characterize without intravascular contrast. This poorly defined soft tissue mass/thickening is the likely source of the bilateral obstructive uropathy. This may represent local spread of neoplastic mass related to the given history of endometrial cancer. Alternatively, this could represent postsurgical and/or post-treatment changes (scarring/fibrosis). There are no prior studies available for assessment of chronicity. 2. Bladder partially decompressed by Foley catheter. As above, there is soft tissue mass/thickening circumferentially about the bladder. This may represent bladder wall thickening or an adjacent soft tissue encasement. 3. Surgical clips are noted along the bilateral pelvic sidewalls. Previous tumor resection? 4. Cholelithiasis without evidence of acute cholecystitis. These results were called by telephone at the time of interpretation on 07/21/2015 at 3:45 pm to Dr. Nicole Kindred, who verbally acknowledged these results. Electronically Signed   By: Franki Cabot M.D.   On: 07/21/2015 15:47   US Renal  07/21/2015  CLINICAL DATA:  Elevated serum creatinine EXAM: RENAL / URINARY TRACT ULTRASOUND COMPLETE COMPARISON:  None. FINDINGS: Right Kidney: Length: 11.5 cm.  Hydronephrosis.  Mild cortical thinning. Left Kidney: Length: 12 cm.  Hydronephrosis.  Mild cortical thinning. Bladder: Distended with internal debris. No detectable wall thickening. Postvoid bladder volume is essentially unchanged. These results were called by telephone at the time of interpretation on 07/21/2015 at 12:37 am to Dr. Merrilyn Puma , who verbally acknowledged these results. IMPRESSION: 1. Bilateral hydronephrosis and distended bladder compatible with obstructive  uropathy. 2. Debris in the bladder, usually infectious. Electronically Signed   By: Monte Fantasia M.D.   On: 07/21/2015 00:43   Medications: I have reviewed the patient's current medications. Scheduled Meds: . sodium chloride  10 mL/hr Intravenous Once  . cefTRIAXone (ROCEPHIN)  IV  1 g Intravenous Q24H  . insulin aspart  0-9 Units Subcutaneous TID WC  . pneumococcal 23 valent vaccine  0.5 mL Intramuscular Tomorrow-1000  . polyethylene glycol  17 g Oral Daily  . [START ON 07/23/2015] vancomycin  500 mg Intravenous Q48H   Continuous Infusions:  PRN Meds:.  Assessment/Plan:  Heidi [name] is a friendly 75 year old [place] lady with type 2 diabetes presenting with a complicated urinary tract infection, normocytic anemia, and obstructive uropathy.  Pelvic Mass suspicious for malignancy: CT abdomen/pelivs done yesterday shows bilateral hydronephrosis, likely chronic, with poorly defined soft tissue mass/thckening within the pelvis. Surrounding bladder and tihin the bilateral adnexal regions. Spoke with Gyn-Onc, suspicious for cervical cancer.  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 Foley catheter to decompress. Cr improving but unclear baseline. OB-GYN concerned for possible need for percutaneous nephrostomy tubes. Will monitor Cr and monitor need. -BMP tomorrow  Complicated urinary tract infection: Heidi [name] is improving with IV ceftriaxone. BCx 1/2 tubes growing G+ cocci. Possible contaminant. Recultured today. -Continue IV ceftriaxone while inpatient 4/29 >> -Follow urine cultures - re incubated  -Follow BCx  Normocytic anemia: Her hemoglobin corrected appropriately from 6 to 9 after Heidi [name] received 3U PRBCs so I doubt hemolysis. Heidi [name] appears to be iron-deficient suggestive of chronic bleeding. Heidi [name] also seems to have had endometrial cancer in the  past but denies any vaginal bleeding. Given her history of cancer and  obstructive uropathy, we'll get a CT abd/pelvis without contrast today to look for malignancy. Another consideration is multiple myeloma given her concomitant renal disease, but Heidi [name]'s not hypercalcemic; we'll send MM work-up labs. Likely 2/2 underlying malignancy.  -Follow SPEP, UPEP, IFE -CBC tomorrow   Dispo: Disposition is deferred at this time, awaiting improvement of current medical problems.  Anticipated discharge in approximately 2-3 day(s).   The patient does not have a current PCP (No Pcp Per Patient) and does need an OPC hospital follow-up appointment after discharge.  The patient does have transportation limitations that hinder transportation to clinic appointments.  .Services Needed at time of discharge: Y = Yes, Blank = No PT:   OT:   RN:   Equipment:   Other:     LOS: 2 days   Nathan Boswell, MD 07/22/2015, 1:38 PM 

## 2015-07-22 NOTE — Evaluation (Signed)
Physical Therapy Evaluation Patient Details Name: Heidi [name] MRN: [id number] DOB: [birth year] Today's Date: 07/22/2015   History of Present Illness  Pt is a 75 year old [place] lady who recently moved to the [place] in November 2016 with type 2 diabetes but no other known medical problems presenting with suprapubic abdominal pain, dysuria, and fever. She was admitted with dx of severe sepsis.  Clinical Impression  Pt is mod I with all functional mobility. Supervision provided for hallway ambulation and stairs for safety only during eval. No further PT intervention indicated. Recommend pt ambulate in hallway with nursing or family. PT signing off.    Follow Up Recommendations No PT follow up    Equipment Recommendations  None recommended by PT    Recommendations for Other Services       Precautions / Restrictions Precautions Precautions: None      Mobility  Bed Mobility Overal bed mobility: Modified Independent             General bed mobility comments: increased time required  Transfers Overall transfer level: Modified independent Equipment used: None                Ambulation/Gait Ambulation/Gait assistance: Supervision Ambulation Distance (Feet): 500 Feet Assistive device: None Gait Pattern/deviations: WFL(Within Functional Limits) Gait velocity: mildly decreased   General Gait Details: steady gait   Stairs Stairs: Yes Stairs assistance: Supervision Stair Management: One rail Right;Alternating pattern;Step to pattern;Forwards Number of Stairs: 11 General stair comments: supervision for safety only. Pt demo alternating pattern with ascent and step to pattern with descent.  Wheelchair Mobility    Modified Rankin (Stroke Patients Only)       Balance                                             Pertinent Vitals/Pain Pain Assessment: No/denies pain    Home Living Family/patient expects to be discharged  to:: Private residence Living Arrangements: Children Available Help at Discharge: Family;Available PRN/intermittently Type of Home: House Home Access: Stairs to enter Entrance Stairs-Rails: Right Entrance Stairs-Number of Steps: 3 Home Layout: One level Home Equipment: None      Prior Function Level of Independence: Independent               Hand Dominance        Extremity/Trunk Assessment   Upper Extremity Assessment: Overall WFL for tasks assessed           Lower Extremity Assessment: Overall WFL for tasks assessed      Cervical / Trunk Assessment: Normal  Communication   Communication: Prefers language other than English (Granddaughter used to interpret.)  Cognition Arousal/Alertness: Awake/alert Behavior During Therapy: WFL for tasks assessed/performed Overall Cognitive Status: Difficult to assess (appears [name])                      General Comments      Exercises        Assessment/Plan    PT Assessment Patent does not need any further PT services  PT Diagnosis Difficulty walking   PT Problem List    PT Treatment Interventions     PT Goals (Current goals can be found in the Care Plan section) Acute Rehab PT Goals Patient Stated Goal: not stated PT Goal Formulation: All assessment and education complete, DC therapy    Frequency  Barriers to discharge        Co-evaluation               End of Session Equipment Utilized During Treatment: Gait belt Activity Tolerance: Patient tolerated treatment well Patient left: in chair;with family/visitor present;with call bell/phone within reach Nurse Communication: Mobility status         Time: [id number] PT Time Calculation (min) (ACUTE ONLY): 20 min   Charges:   PT Evaluation $PT Eval Moderate Complexity: 1 Procedure     PT G Codes:        Heidi [name] 07/22/2015, 9:30 AM

## 2015-07-23 ENCOUNTER — Inpatient Hospital Stay (HOSPITAL_COMMUNITY): Payer: Medicaid Other

## 2015-07-23 DIAGNOSIS — C801 Malignant (primary) neoplasm, unspecified: Secondary | ICD-10-CM | POA: Insufficient documentation

## 2015-07-23 DIAGNOSIS — B961 Klebsiella pneumoniae [K. pneumoniae] as the cause of diseases classified elsewhere: Secondary | ICD-10-CM

## 2015-07-23 DIAGNOSIS — B957 Other staphylococcus as the cause of diseases classified elsewhere: Secondary | ICD-10-CM

## 2015-07-23 LAB — CBC
HEMATOCRIT: 30.8 % — AB (ref 36.0–46.0)
Hemoglobin: 10.3 g/dL — ABNORMAL LOW (ref 12.0–15.0)
MCH: 28.9 pg (ref 26.0–34.0)
MCHC: 33.4 g/dL (ref 30.0–36.0)
MCV: 86.5 fL (ref 78.0–100.0)
PLATELETS: 266 10*3/uL (ref 150–400)
RBC: 3.56 MIL/uL — AB (ref 3.87–5.11)
RDW: 14.4 % (ref 11.5–15.5)
WBC: 7.8 10*3/uL (ref 4.0–10.5)

## 2015-07-23 LAB — BASIC METABOLIC PANEL
ANION GAP: 12 (ref 5–15)
BUN: 37 mg/dL — ABNORMAL HIGH (ref 6–20)
CHLORIDE: 109 mmol/L (ref 101–111)
CO2: 17 mmol/L — ABNORMAL LOW (ref 22–32)
Calcium: 9 mg/dL (ref 8.9–10.3)
Creatinine, Ser: 2.7 mg/dL — ABNORMAL HIGH (ref 0.44–1.00)
GFR, EST AFRICAN AMERICAN: 19 mL/min — AB (ref >60)
GFR, EST NON AFRICAN AMERICAN: 16 mL/min — AB (ref >60)
Glucose, Bld: 97 mg/dL (ref 65–99)
Potassium: 4.4 mmol/L (ref 3.5–5.1)
SODIUM: 138 mmol/L (ref 135–145)

## 2015-07-23 LAB — GLUCOSE, CAPILLARY
GLUCOSE-CAPILLARY: 126 mg/dL — AB (ref 65–99)
GLUCOSE-CAPILLARY: 187 mg/dL — AB (ref 65–99)
Glucose-Capillary: 187 mg/dL — ABNORMAL HIGH (ref 65–99)
Glucose-Capillary: 113 mg/dL — ABNORMAL HIGH (ref 65–99)
Glucose-Capillary: 150 mg/dL — ABNORMAL HIGH (ref 65–99)

## 2015-07-23 MED ORDER — CEFAZOLIN SODIUM 1-5 GM-% IV SOLN
1.0000 g | Freq: Two times a day (BID) | INTRAVENOUS | Status: DC
  Administered 2015-07-23 – 2015-07-24 (×2): 1 g via INTRAVENOUS
  Filled 2015-07-23 (×2): qty 50

## 2015-07-23 MED ORDER — OXYCODONE-ACETAMINOPHEN 5-325 MG PO TABS
1.0000 | ORAL_TABLET | Freq: Four times a day (QID) | ORAL | Status: DC | PRN
  Administered 2015-07-23 – 2015-07-24 (×2): 1 via ORAL
  Filled 2015-07-23 (×2): qty 1

## 2015-07-23 MED ORDER — CIPROFLOXACIN IN D5W 400 MG/200ML IV SOLN
400.0000 mg | Freq: Once | INTRAVENOUS | Status: AC
  Administered 2015-07-23: 400 mg via INTRAVENOUS
  Filled 2015-07-23: qty 200

## 2015-07-23 MED ORDER — PROMETHAZINE HCL 25 MG PO TABS
12.5000 mg | ORAL_TABLET | ORAL | Status: DC | PRN
  Administered 2015-07-24: 12.5 mg via ORAL
  Filled 2015-07-23 (×2): qty 1

## 2015-07-23 MED ORDER — LIDOCAINE HCL 1 % IJ SOLN
INTRAMUSCULAR | Status: AC
  Administered 2015-07-23: 15 mL
  Filled 2015-07-23: qty 20

## 2015-07-23 MED ORDER — MIDAZOLAM HCL 2 MG/2ML IJ SOLN
INTRAMUSCULAR | Status: AC | PRN
  Administered 2015-07-23: 0.5 mg via INTRAVENOUS
  Administered 2015-07-23: 1 mg via INTRAVENOUS

## 2015-07-23 MED ORDER — IOPAMIDOL (ISOVUE-300) INJECTION 61%
INTRAVENOUS | Status: AC
  Administered 2015-07-23: 15 mL
  Filled 2015-07-23: qty 50

## 2015-07-23 MED ORDER — FENTANYL CITRATE (PF) 100 MCG/2ML IJ SOLN
INTRAMUSCULAR | Status: AC | PRN
  Administered 2015-07-23: 50 ug via INTRAVENOUS
  Administered 2015-07-23: 25 ug via INTRAVENOUS

## 2015-07-23 MED ORDER — MIDAZOLAM HCL 2 MG/2ML IJ SOLN
INTRAMUSCULAR | Status: AC
  Filled 2015-07-23: qty 2

## 2015-07-23 MED ORDER — FENTANYL CITRATE (PF) 100 MCG/2ML IJ SOLN
INTRAMUSCULAR | Status: AC
  Filled 2015-07-23: qty 2

## 2015-07-23 NOTE — Sedation Documentation (Signed)
Patient is resting comfortably. 

## 2015-07-23 NOTE — H&P (Signed)
Chief Complaint: Bilateral hydronephrosis  Referring Physician(s): Maryellen Pile  Supervising Physician: Markus Daft  Patient Status: In-pt   History of Present Illness: Heidi [name] is a 75 y.o. female who recently moved to the [place] in November 2016 with type 2 diabetes but no other known medical problems presenting with suprapubic abdominal pain, dysuria, and fever.  Last week, she began feeling suprapubic abdominal pain and dysuria; this was followed by fever and chills. She denies flank pain.   She has had numerous urinary tract infections over the last 20 years in [place]  She has been losing weight for the last few years but is not sure why.   She has history of "gynecological cancer" s/p surgery and chemo in 2003.  CT scan done 07/21/2015 reavealed = IMPRESSION: 1. Bilateral hydronephrosis, moderate-to-severe in degree, favored to be chronic given the lack of significant perinephric inflammation. Poorly defined soft tissue mass/thickening within the pelvis, surrounding the bladder and within the bilateral adnexal regions, difficult to definitively characterize without intravascular contrast. This poorly defined soft tissue mass/thickening is the likely source of the bilateral obstructive uropathy. This may represent local spread of neoplastic mass related to the given history of endometrial cancer. Alternatively, this could represent postsurgical and/or post-treatment changes (scarring/fibrosis). There are no prior studies available for assessment of chronicity. 2. Bladder partially decompressed by Foley catheter. As above, there is soft tissue mass/thickening circumferentially about the bladder. This may represent bladder wall thickening or an adjacent soft tissue encasement. 3. Surgical clips are noted along the bilateral pelvic sidewalls. Previous tumor resection?  She at breakfast at 8:30. She is not taking blood thinners.  Patients  speaks Spanish = Interpreter used  Past Medical History  Diagnosis Date  . Diabetes mellitus without complication ([place])     History reviewed. No pertinent past surgical history.  Allergies: Penicillins  Medications: Prior to Admission medications   Medication Sig Start Date End Date Taking? Authorizing Provider  glyBURIDE-metformin (GLUCOVANCE) 5-500 MG tablet Take 1 tablet by mouth 2 (two) times daily as needed (for high blood sugar). Blood sugar > 150 then pt takes, if < 95 pt does not take   Yes Historical Provider, MD     No family history on file.  Social History   Social History  . Marital Status: Widowed    Spouse Name: N/A  . Number of Children: N/A  . Years of Education: N/A   Social History Main Topics  . Smoking status: Never Smoker   . Smokeless tobacco: None  . Alcohol Use: None  . Drug Use: None  . Sexual Activity: Not Asked   Other Topics Concern  . None   Social History Narrative  . None     Review of Systems: A 12 point ROS discussed Review of Systems  Constitutional: Positive for activity change, appetite change and fatigue. Negative for fever and chills.  HENT: Negative.   Respiratory: Negative for cough, shortness of breath and wheezing.   Cardiovascular: Negative for chest pain.  Gastrointestinal: Positive for abdominal pain.  Genitourinary: Positive for flank pain.  Musculoskeletal: Negative.   Skin: Negative.   Neurological: Negative.   Hematological: Negative.   Psychiatric/Behavioral: Negative.     Vital Signs: BP 141/59 mmHg  Pulse 80  Temp(Src) 98.6 F (37 C) (Oral)  Resp 17  Ht 5' (1.524 m)  Wt 98 lb 15.8 oz (44.9 kg)  BMI 19.33 kg/m2  SpO2 96%  Physical Exam  Constitutional: She is oriented to  person, place, and time. She appears well-developed and well-nourished.  HENT:  Head: Normocephalic and atraumatic.  Eyes: EOM are normal.  Neck: Neck supple.  Cardiovascular: Normal rate, regular rhythm and normal heart  sounds.   Pulmonary/Chest: Effort normal and breath sounds normal.  Abdominal: Soft. Bowel sounds are normal.  Musculoskeletal: Normal range of motion.  Neurological: She is alert and oriented to person, place, and time.  Skin: Skin is warm and dry.  Psychiatric: She has a normal mood and affect. Her behavior is normal. Judgment and thought content normal.  Vitals reviewed.   Mallampati Score:  MD Evaluation Airway: WNL Heart: WNL Abdomen: WNL Chest/ Lungs: WNL ASA  Classification: 3 Mallampati/Airway Score: Two  Imaging: Ct Abdomen Pelvis Wo Contrast  07/21/2015  CLINICAL DATA:  Obstructive uropathy, hematuria. History of endometrial cancer. EXAM: CT ABDOMEN AND PELVIS WITHOUT CONTRAST TECHNIQUE: Multidetector CT imaging of the abdomen and pelvis was performed following the standard protocol without IV contrast. COMPARISON:  None. FINDINGS: Study is limited by the lack of intravascular contrast. Lower chest:  There is mild bibasilar atelectasis. Hepatobiliary: No mass visualized within the liver on this un-enhanced exam. Stones are seen within the contracted gallbladder but there is no pericholecystic fluid or other secondary signs of an acute cholecystitis. No bile duct dilatation appreciated. Pancreas: Diminutive but otherwise unremarkable. No peripancreatic fluid. Spleen: Within normal limits in size. Adrenals/Urinary Tract: There is bilateral hydronephrosis, moderate to severe in degree, and bilateral hydroureter. There is irregular soft tissue thickening/mass within the pelvis (posterior and above the level of the bladder) which is the likely source of the bilateral ureteral obstruction. Bladder is decompressed by Foley catheter. Stomach/Bowel: No dilated large or small bowel loops. Appendix is not seen. Stomach appears normal. Vascular/Lymphatic: Heavy atherosclerotic changes are seen along the walls of the normal- caliber abdominal aorta and throughout the pelvic vasculature.  Reproductive: Soft tissue mass/thickening within the pelvis may be related to the uterus given the provided history of endometrial cancer. This soft tissue mass/thickening extends into the bilateral adnexal regions. Normal structures are not clearly delineated. Multiple surgical clips are seen within the pelvis. Other: No free fluid or abscess collection identified. No free intraperitoneal air seen. Musculoskeletal: Degenerative changes of the thoracolumbar spine and degenerative changes within the pelvis. No acute or suspicious osseous lesion. IMPRESSION: 1. Bilateral hydronephrosis, moderate-to-severe in degree, favored to be chronic given the lack of significant perinephric inflammation. Poorly defined soft tissue mass/thickening within the pelvis, surrounding the bladder and within the bilateral adnexal regions, difficult to definitively characterize without intravascular contrast. This poorly defined soft tissue mass/thickening is the likely source of the bilateral obstructive uropathy. This may represent local spread of neoplastic mass related to the given history of endometrial cancer. Alternatively, this could represent postsurgical and/or post-treatment changes (scarring/fibrosis). There are no prior studies available for assessment of chronicity. 2. Bladder partially decompressed by Foley catheter. As above, there is soft tissue mass/thickening circumferentially about the bladder. This may represent bladder wall thickening or an adjacent soft tissue encasement. 3. Surgical clips are noted along the bilateral pelvic sidewalls. Previous tumor resection? 4. Cholelithiasis without evidence of acute cholecystitis. These results were called by telephone at the time of interpretation on 07/21/2015 at 3:45 pm to Dr. Nicole Kindred, who verbally acknowledged these results. Electronically Signed   By: Franki Cabot M.D.   On: 07/21/2015 15:47   US Renal  07/21/2015  CLINICAL DATA:  Elevated serum creatinine EXAM: RENAL /  URINARY TRACT ULTRASOUND COMPLETE COMPARISON:  None. FINDINGS: Right Kidney: Length: 11.5 cm.  Hydronephrosis.  Mild cortical thinning. Left Kidney: Length: 12 cm.  Hydronephrosis.  Mild cortical thinning. Bladder: Distended with internal debris. No detectable wall thickening. Postvoid bladder volume is essentially unchanged. These results were called by telephone at the time of interpretation on 07/21/2015 at 12:37 am to Dr. Merrilyn Puma , who verbally acknowledged these results. IMPRESSION: 1. Bilateral hydronephrosis and distended bladder compatible with obstructive uropathy. 2. Debris in the bladder, usually infectious. Electronically Signed   By: Monte Fantasia M.D.   On: 07/21/2015 00:43    Labs:  CBC:  Recent Labs  07/20/15 1735 07/21/15 0841 07/22/15 0539 07/23/15 0559  WBC 10.6* 13.0* 9.3 7.8  HGB 5.5* 9.7* 9.5* 10.3*  HCT 17.2* 29.2* 28.2* 30.8*  PLT 263 219 225 266    COAGS:  Recent Labs  07/20/15 1841  INR 1.34    BMP:  Recent Labs  07/20/15 1519 07/21/15 0841 07/22/15 0539 07/23/15 0559  NA 132* 138 137 138  K 4.7 4.5 4.3 4.4  CL 105 111 110 109  CO2 17* 16* 18* 17*  GLUCOSE 228* 136* 86 97  BUN 46* 40* 35* 37*  CALCIUM 8.3* 8.4* 8.7* 9.0  CREATININE 3.37* 3.22* 2.97* 2.70*  GFRNONAA 12* 13* 14* 16*  GFRAA 14* 15* 17* 19*    LIVER FUNCTION TESTS:  Recent Labs  07/20/15 1519  BILITOT 0.3  AST 13*  ALT 8*  ALKPHOS 84  PROT 7.6  ALBUMIN 2.3*    TUMOR MARKERS: No results for input(s): AFPTM, CEA, CA199, CHROMGRNA in the last 8760 hours.  Assessment and Plan:  History of Gynecologic cancer 2003  Pelvic mass  Bilateral hydronephrosis.  Will proceed with image guided placement of nephrostomy tubes today by Dr. Anselm Pancoast.  Risks and Benefits discussed with the patient including, but not limited to infection, bleeding, significant bleeding causing loss or decrease in renal function or damage to adjacent structures.   All of the patient's questions  were answered, patient is agreeable to proceed. Consent signed and in chart.  Thank you for this interesting consult.  I greatly enjoyed meeting Jayelyn Diasia Sivertsen and look forward to participating in their care.  A copy of this report was sent to the requesting provider on this date.  Electronically Signed: Murrell Redden PA-C 07/23/2015, 1:50 PM   I spent a total of 40 Minutes in face to face in clinical consultation, greater than 50% of which was counseling/coordinating care for nephrostomy tubes.

## 2015-07-23 NOTE — Progress Notes (Signed)
Patient ID: Heidi [name], female   DOB: [birth year], 75 y.o.   MRN: [id number]   Subjective: [name] dysuria and suprapubic abdominal pain have improved today.  Denies any vaginal bleeding or hematuria. She has no complaints.  Objective: Vital signs in last 24 hours: Filed Vitals:   07/22/15 1740 07/22/15 2115 07/23/15 0325 07/23/15 0830  BP: 135/57 161/65 149/55 141/59  Pulse: 79 80 79 80  Temp: 99 F (37.2 C) 99.1 F (37.3 C) 99 F (37.2 C) 98.6 F (37 C)  TempSrc: Oral Oral Oral Oral  Resp: 18 18 18 17   Height:      Weight:  98 lb 15.8 oz (44.9 kg)    SpO2: 98% 94% 95% 96%   General: elderly, friendly [place] lady resting in bed comfortably HEENT: pale conjuctiva, extra-ocular muscles intact, oropharynx without lesions Cardiac: regular rate and rhythm, no rubs, murmurs or gallops Pulm: breathing well, clear to auscultation bilaterally Abd: bowel sounds normal, soft, nondistended, suprapubic abdominal tenderness, no CVA tenderness Ext: warm and well perfused, without pedal edema Lymph: no cervical or supraclavicular lymphadenopathy Skin: half and half nails; no other skin lesions Neuro: alert and oriented X3, cranial nerves II-XII grossly intact, moving all extremities well  Lab Results: Basic Metabolic Panel:  Recent Labs Lab 07/22/15 0539 07/23/15 0559  NA 137 138  K 4.3 4.4  CL 110 109  CO2 18* 17*  GLUCOSE 86 97  BUN 35* 37*  CREATININE 2.97* 2.70*  CALCIUM 8.7* 9.0   CBC:  Recent Labs Lab 07/20/15 1735  07/22/15 0539 07/23/15 0559  WBC 10.6*  < > 9.3 7.8  NEUTROABS 8.1*  --   --   --   HGB 5.5*  < > 9.5* 10.3*  HCT 17.2*  < > 28.2* 30.8*  MCV 93.0  < > 87.6 86.5  PLT 263  < > 225 266  < > = values in this interval not displayed. Anemia Panel:  Recent Labs Lab 07/20/15 1735 07/20/15 1736  VITAMINB12 421  --   FOLATE  --  23.0  FERRITIN 279  --   TIBC 225*  --   IRON 17*  --   RETICCTPCT 2.7  --    Studies/Results: Ct  Abdomen Pelvis Wo Contrast  07/21/2015  CLINICAL DATA:  Obstructive uropathy, hematuria. History of endometrial cancer. EXAM: CT ABDOMEN AND PELVIS WITHOUT CONTRAST TECHNIQUE: Multidetector CT imaging of the abdomen and pelvis was performed following the standard protocol without IV contrast. COMPARISON:  None. FINDINGS: Study is limited by the lack of intravascular contrast. Lower chest:  There is mild bibasilar atelectasis. Hepatobiliary: No mass visualized within the liver on this un-enhanced exam. Stones are seen within the contracted gallbladder but there is no pericholecystic fluid or other secondary signs of an acute cholecystitis. No bile duct dilatation appreciated. Pancreas: Diminutive but otherwise unremarkable. No peripancreatic fluid. Spleen: Within normal limits in size. Adrenals/Urinary Tract: There is bilateral hydronephrosis, moderate to severe in degree, and bilateral hydroureter. There is irregular soft tissue thickening/mass within the pelvis (posterior and above the level of the bladder) which is the likely source of the bilateral ureteral obstruction. Bladder is decompressed by Foley catheter. Stomach/Bowel: No dilated large or small bowel loops. Appendix is not seen. Stomach appears normal. Vascular/Lymphatic: Heavy atherosclerotic changes are seen along the walls of the normal- caliber abdominal aorta and throughout the pelvic vasculature. Reproductive: Soft tissue mass/thickening within the pelvis may be related to the uterus given the provided history of endometrial cancer.  This soft tissue mass/thickening extends into the bilateral adnexal regions. Normal structures are not clearly delineated. Multiple surgical clips are seen within the pelvis. Other: No free fluid or abscess collection identified. No free intraperitoneal air seen. Musculoskeletal: Degenerative changes of the thoracolumbar spine and degenerative changes within the pelvis. No acute or suspicious osseous lesion. IMPRESSION:  1. Bilateral hydronephrosis, moderate-to-severe in degree, favored to be chronic given the lack of significant perinephric inflammation. Poorly defined soft tissue mass/thickening within the pelvis, surrounding the bladder and within the bilateral adnexal regions, difficult to definitively characterize without intravascular contrast. This poorly defined soft tissue mass/thickening is the likely source of the bilateral obstructive uropathy. This may represent local spread of neoplastic mass related to the given history of endometrial cancer. Alternatively, this could represent postsurgical and/or post-treatment changes (scarring/fibrosis). There are no prior studies available for assessment of chronicity. 2. Bladder partially decompressed by Foley catheter. As above, there is soft tissue mass/thickening circumferentially about the bladder. This may represent bladder wall thickening or an adjacent soft tissue encasement. 3. Surgical clips are noted along the bilateral pelvic sidewalls. Previous tumor resection? 4. Cholelithiasis without evidence of acute cholecystitis. These results were called by telephone at the time of interpretation on 07/21/2015 at 3:45 pm to Dr. Nicole Kindred, who verbally acknowledged these results. Electronically Signed   By: Franki Cabot M.D.   On: 07/21/2015 15:47   Medications: I have reviewed the patient's current medications. Scheduled Meds: . sodium chloride  10 mL/hr Intravenous Once  . cefTRIAXone (ROCEPHIN)  IV  1 g Intravenous Q24H  . insulin aspart  0-9 Units Subcutaneous TID WC  . polyethylene glycol  17 g Oral Daily   Continuous Infusions:  PRN Meds:.  Assessment/Plan:  [name] is a friendly 75 year old [place] lady with type 2 diabetes presenting with a complicated urinary tract infection, normocytic anemia, and obstructive uropathy.  Pelvic Mass suspicious for cervical malignancy: CT abdomen/pelivs done shows bilateral hydronephrosis, likely chronic,  with poorly defined soft tissue mass/thckening within the pelvis. Surrounding bladder and tihin the bilateral adnexal regions. Spoke with Gyn-Onc, suspicious for cervical cancer.  -BMET/CBC in am -Appreciate gyn-onc consult  Renal insufficiency with bilateral hydronephrosis: Likely long standing 2/2 malignancy. Cr improving but unclear baseline. OB-GYN concerned for possible need for percutaneous nephrostomy tubes. Spoke with urology, will go ahead and consult IR for nephrostomy tubes. Not NPO today, possible tomorrow. -Appreciate urology consult -Appreciate IR consult  Complicated urinary tract infection: She is improving with IV ceftriaxone. BCx 1/2 tubes growing coag negative staph. Likely contaminant. Recultured 5/1. UCx growing pansensitive Klebsiella.  -Continue IV ceftriaxone while inpatient 4/29 >> -Follow blood cultures  Normocytic anemia: Her hemoglobin corrected appropriately from 6 to 9 after she received 3U PRBCs so I doubt hemolysis. She appears to be iron-deficient suggestive of chronic bleeding. She also seems to have had endometrial cancer in the past but denies any vaginal bleeding. Given her history of cancer and obstructive uropathy, we'll get a CT abd/pelvis without contrast today to look for malignancy. Another consideration is multiple myeloma given her concomitant renal disease, but she's not hypercalcemic; we'll send MM work-up labs. Likely 2/2 underlying malignancy.  -No M-spike, SPEP elevated likely 2/2 renal disease -IFE pending -CBC tomorrow   Dispo: Disposition is deferred at this time, awaiting improvement of current medical problems.  Anticipated discharge in approximately 2-3 day(s).   The patient does not have a current PCP (No Pcp Per Patient) and does need an [facility] hospital follow-up appointment  after discharge.  The patient does have transportation limitations that hinder transportation to clinic appointments.  .Services Needed at time of discharge: Y = Yes,  Blank = No PT:   OT:   RN:   Equipment:   Other:     LOS: 3 days   Maryellen Pile, MD 07/23/2015, 11:53 AM

## 2015-07-23 NOTE — Procedures (Signed)
Successful placement of bilateral nephrostomy tubes.  10 French drains placed in both kidneys.  Urine sample sent from both kidneys.  Minimal blood loss.  No immediate complication.

## 2015-07-23 NOTE — Consult Note (Signed)
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 female.   HPI:   1 - Bilateral Hydronephrosis / Stage 4 Renal Insufficiency - bilateral mod-severe hydro to deep pelvis / mass area by CT 06/2015 on eval UTI and renal failure. Cr upper 2'-3's without hyperkalemia. Pt's family admits to several weeks of increasing malaise and lack of energy. Bilat neph tubes place 5/2.   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 No cancer dicrected therapy since 2003. Likely radiation tatoo noted on lower abdomen, but family not sure if she had radiation. CT this admission with very concerning pelvic mass with loss of fat planes around bladder by non-contrast CT 06/2015. GYN-Oncology eval pending. Bilateral likely malignany hydro as per above.  3 - Febrile UTI - fevers, malaise, bacteruria on presentation, UCX pan-sensitive klebsiella. Known hydro as per above.   PMH sig for GYN cancer. She lives in [place] and is in [place] visitin family for past few mos and became progressively weak. Her graddaugher Heidi [name] is very involved with her care and lives in [place] and is reacahble at [phone]. [name]  Today "Heidi [name]" is seen in consultation for above.   Past Medical History  Diagnosis Date  . Diabetes mellitus without complication ([place])     History reviewed. No pertinent past surgical history.  No family history on file.  Social History:  reports that she has never smoked. She does not have any smokeless tobacco history on file. Her alcohol and drug histories are not on file.  Allergies:  Ct Abdomen Pelvis Wo Contrast  07/21/2015  CLINICAL  DATA:  Obstructive uropathy, hematuria. History of endometrial cancer. EXAM: CT ABDOMEN AND PELVIS WITHOUT CONTRAST TECHNIQUE: Multidetector CT imaging of the abdomen and pelvis was performed following the standard protocol without IV contrast. COMPARISON:  None. FINDINGS: Study is limited by the lack of intravascular contrast. Lower chest:  There is mild bibasilar atelectasis. Hepatobiliary: No mass visualized within the liver on this un-enhanced exam. Stones are seen within the contracted gallbladder but there is no pericholecystic fluid or other secondary signs of an acute cholecystitis. No bile duct dilatation appreciated. Pancreas: Diminutive but otherwise unremarkable. No peripancreatic fluid. Spleen: Within normal limits in size. Adrenals/Urinary Tract: There is bilateral hydronephrosis, moderate to severe in degree, and bilateral hydroureter. There is irregular soft tissue thickening/mass within the pelvis (posterior and above the level of the bladder) which is the likely source of the bilateral ureteral obstruction. Bladder is decompressed by Foley catheter. Stomach/Bowel: No dilated large or small bowel loops. Appendix is not seen. Stomach appears normal. Vascular/Lymphatic: Heavy atherosclerotic changes are seen along the walls of the normal- caliber abdominal aorta and throughout the pelvic vasculature. Reproductive: Soft tissue mass/thickening within the pelvis may be related to the uterus given the provided history of endometrial cancer. This soft tissue mass/thickening extends into the bilateral adnexal regions. Normal structures are not clearly delineated. Multiple surgical clips are seen within the pelvis. Other: No free fluid or abscess collection identified. No free intraperitoneal air seen. Musculoskeletal: Degenerative changes of the thoracolumbar spine and degenerative changes within the pelvis. No acute or suspicious osseous lesion. IMPRESSION: 1. Bilateral hydronephrosis, moderate-to-severe  in degree, favored to  be chronic given the lack of significant perinephric inflammation. Poorly defined soft tissue mass/thickening within the pelvis, surrounding the bladder and within the bilateral adnexal regions, difficult to definitively characterize without intravascular contrast. This poorly defined soft tissue mass/thickening is the likely source of the bilateral obstructive uropathy. This may represent local spread of neoplastic mass related to the given history of endometrial cancer. Alternatively, this could represent postsurgical and/or post-treatment changes (scarring/fibrosis). There are no prior studies available for assessment of chronicity. 2. Bladder partially decompressed by Foley catheter. As above, there is soft tissue mass/thickening circumferentially about the bladder. This may represent bladder wall thickening or an adjacent soft tissue encasement. 3. Surgical clips are noted along the bilateral pelvic sidewalls. Previous tumor resection? 4. Cholelithiasis without evidence of acute cholecystitis. These results were called by telephone at the time of interpretation on 07/21/2015 at 3:45 pm to Dr. Nicole Kindred, who verbally acknowledged these results. Electronically Signed   By: Franki Cabot M.D.   On: 07/21/2015 15:47    Review of Systems  Constitutional: Positive for weight loss and malaise/fatigue.  HENT: Negative.   Eyes: Negative.   Respiratory: Negative.   Cardiovascular: Negative.   Gastrointestinal: Negative.  Negative for nausea and vomiting.  Genitourinary: Positive for urgency and hematuria.  Musculoskeletal: Negative.   Skin: Negative.   Neurological: Positive for weakness.  Endo/Heme/Allergies: Negative.   Psychiatric/Behavioral: Negative.    Blood pressure 141/59, pulse 80, temperature 98.6 F (37 C), temperature source Oral, resp. rate 17, height 5' (1.524 m), weight 44.9 kg (98 lb 15.8 oz), SpO2 96 %. Physical Exam  Constitutional:  Thin, elderly, grand daughter  and great graddaughter at bedside. Pt quite tired, nearly lethargic.   HENT:  Head: Normocephalic.  Eyes: Pupils are equal, round, and reactive to light.  Neck: Normal range of motion.  Cardiovascular: Normal rate.   Respiratory: Effort normal.  GI: Soft.  Genitourinary:  bilat neph tubes in place with some proteinacious appearing urine. No CVAT.   Musculoskeletal: Normal range of motion.  Neurological: She is alert.  Skin: Skin is warm.  Psychiatric: She has a normal mood and affect. Her behavior is normal. Judgment and thought content normal.    Assessment/Plan:  1 - Bilateral Hydronephrosis / Stage 4 Renal Insufficiency - overall picture very concerning for locally advanced pelvic malignancy and malignant ureteral obstruction. Most dependable means of upper tract drainage in this setting would be bilat neph tubes. Hopefully this will allow for some renal recovery and dreacreased malaise though will likely be only partial. Discussed frankly with daughter that she may me permanantly reliant on neph tubes.   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 This may not be resecable. If GFR improves would benefit from contrast abd imaging such as CT chest / abd / pelvis with / without. Will also need cysto as outpatient to rule out frank bladder involement, which if present would be grave prognosis. I discussed these possibilities with her grand daughter who states she may want to go palliative only if prognosis guarded.   3 - Febrile UTI - agree with current ABX as well as neph tubes as per above for renal decompression, otherwise very unlikely to clear infectious parameters.   4 - I will request Urol follow up as outpatient and for office cystoscopy to help stage likely pelvic malignancy. Please call me directly with questions anytime.   Brenn Gatton 07/23/2015, 1:11 PM

## 2015-07-24 LAB — URINE CULTURE

## 2015-07-24 LAB — GLUCOSE, CAPILLARY
GLUCOSE-CAPILLARY: 176 mg/dL — AB (ref 65–99)
GLUCOSE-CAPILLARY: 234 mg/dL — AB (ref 65–99)
Glucose-Capillary: 154 mg/dL — ABNORMAL HIGH (ref 65–99)
Glucose-Capillary: 220 mg/dL — ABNORMAL HIGH (ref 65–99)

## 2015-07-24 LAB — BASIC METABOLIC PANEL
Anion gap: 9 (ref 5–15)
BUN: 37 mg/dL — AB (ref 6–20)
CHLORIDE: 106 mmol/L (ref 101–111)
CO2: 19 mmol/L — AB (ref 22–32)
Calcium: 8.8 mg/dL — ABNORMAL LOW (ref 8.9–10.3)
Creatinine, Ser: 2.58 mg/dL — ABNORMAL HIGH (ref 0.44–1.00)
GFR calc Af Amer: 20 mL/min — ABNORMAL LOW (ref >60)
GFR, EST NON AFRICAN AMERICAN: 17 mL/min — AB (ref >60)
GLUCOSE: 111 mg/dL — AB (ref 65–99)
POTASSIUM: 4.2 mmol/L (ref 3.5–5.1)
Sodium: 134 mmol/L — ABNORMAL LOW (ref 135–145)

## 2015-07-24 LAB — CULTURE, BLOOD (ROUTINE X 2)

## 2015-07-24 MED ORDER — CEPHALEXIN 500 MG PO CAPS
500.0000 mg | ORAL_CAPSULE | Freq: Two times a day (BID) | ORAL | Status: DC
  Administered 2015-07-24 – 2015-07-25 (×3): 500 mg via ORAL
  Filled 2015-07-24 (×3): qty 1

## 2015-07-24 NOTE — Discharge Summary (Signed)
Name: Heidi [name] MRN: [id number] DOB: 1940/09/27 75 y.o. PCP: No Pcp Per Patient  Date of Admission: 07/20/2015  3:53 PM Date of Discharge: 07/24/2015 Attending Physician: Sid Falcon, MD  Discharge Diagnosis: Active Problems:   Severe sepsis ([place])   Hematuria   UTI (urinary tract infection)   Hydronephrosis determined by ultrasound   Symptomatic anemia   Urinary tract infectious disease   Obstructive uropathy   Malignancy ([place])  Discharge Medications:   Medication List    ASK your doctor about these medications        glyBURIDE-metformin 5-500 MG tablet  Commonly known as:  GLUCOVANCE  Take 1 tablet by mouth 2 (two) times daily as needed (for high blood sugar). Blood sugar > 150 then pt takes, if < 95 pt does not take        Disposition and follow-up:   Heidi [name] was discharged from [facility] in Stable condition.  At the hospital follow up visit please address:  1.  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       B/L hydronephrosis with ARF: 2/2 to above. S/P bilateral nephrostomy tubes 5/2. Follow up with urology and IR.   2.  Labs / imaging needed at time of follow-up: none  3.  Pending labs/ test needing follow-up: none  Follow-up Appointments:     Follow-up Information    Follow up with Donaciano Eva, MD On 07/29/2015.   Specialty:  Obstetrics and Gynecology   Why:  with GYN Oncology at 3:15pm at the [facility] attached to [place] at 2:45pm to register.  You will have a pelvic examination on this day as well.   Contact information:   [address] [place] [phone]       Discharge Instructions:  Consultations: Treatment Team:  Alexis Frock, MD  Procedures Performed:  Ct Abdomen Pelvis Wo Contrast  07/21/2015  CLINICAL DATA:  Obstructive uropathy, hematuria. History of endometrial cancer. EXAM: CT ABDOMEN AND PELVIS WITHOUT  CONTRAST TECHNIQUE: Multidetector CT imaging of the abdomen and pelvis was performed following the standard protocol without IV contrast. COMPARISON:  None. FINDINGS: Study is limited by the lack of intravascular contrast. Lower chest:  There is mild bibasilar atelectasis. Hepatobiliary: No mass visualized within the liver on this un-enhanced exam. Stones are seen within the contracted gallbladder but there is no pericholecystic fluid or other secondary signs of an acute cholecystitis. No bile duct dilatation appreciated. Pancreas: Diminutive but otherwise unremarkable. No peripancreatic fluid. Spleen: Within normal limits in size. Adrenals/Urinary Tract: There is bilateral hydronephrosis, moderate to severe in degree, and bilateral hydroureter. There is irregular soft tissue thickening/mass within the pelvis (posterior and above the level of the bladder) which is the likely source of the bilateral ureteral obstruction. Bladder is decompressed by Foley catheter. Stomach/Bowel: No dilated large or small bowel loops. Appendix is not seen. Stomach appears normal. Vascular/Lymphatic: Heavy atherosclerotic changes are seen along the walls of the normal- caliber abdominal aorta and throughout the pelvic vasculature. Reproductive: Soft tissue mass/thickening within the pelvis may be related to the uterus given the provided history of endometrial cancer. This soft tissue mass/thickening extends into the bilateral adnexal regions. Normal structures are not clearly delineated. Multiple surgical clips are seen within the pelvis. Other: No free fluid or abscess collection identified. No free intraperitoneal air seen. Musculoskeletal: Degenerative changes of the thoracolumbar spine and degenerative changes within the pelvis. No  acute or suspicious osseous lesion. IMPRESSION: 1. Bilateral hydronephrosis, moderate-to-severe in degree, favored to be chronic given the lack of significant perinephric inflammation. Poorly defined soft  tissue mass/thickening within the pelvis, surrounding the bladder and within the bilateral adnexal regions, difficult to definitively characterize without intravascular contrast. This poorly defined soft tissue mass/thickening is the likely source of the bilateral obstructive uropathy. This may represent local spread of neoplastic mass related to the given history of endometrial cancer. Alternatively, this could represent postsurgical and/or post-treatment changes (scarring/fibrosis). There are no prior studies available for assessment of chronicity. 2. Bladder partially decompressed by Foley catheter. As above, there is soft tissue mass/thickening circumferentially about the bladder. This may represent bladder wall thickening or an adjacent soft tissue encasement. 3. Surgical clips are noted along the bilateral pelvic sidewalls. Previous tumor resection? 4. Cholelithiasis without evidence of acute cholecystitis. These results were called by telephone at the time of interpretation on 07/21/2015 at 3:45 pm to Dr. Nicole Kindred, who verbally acknowledged these results. Electronically Signed   By: Franki Cabot M.D.   On: 07/21/2015 15:47   US Renal  07/21/2015  CLINICAL DATA:  Elevated serum creatinine EXAM: RENAL / URINARY TRACT ULTRASOUND COMPLETE COMPARISON:  None. FINDINGS: Right Kidney: Length: 11.5 cm.  Hydronephrosis.  Mild cortical thinning. Left Kidney: Length: 12 cm.  Hydronephrosis.  Mild cortical thinning. Bladder: Distended with internal debris. No detectable wall thickening. Postvoid bladder volume is essentially unchanged. These results were called by telephone at the time of interpretation on 07/21/2015 at 12:37 am to Dr. Merrilyn Puma , who verbally acknowledged these results. IMPRESSION: 1. Bilateral hydronephrosis and distended bladder compatible with obstructive uropathy. 2. Debris in the bladder, usually infectious. Electronically Signed   By: Monte Fantasia M.D.   On: 07/21/2015 00:43   Ir Nephrostomy  Placement Left  07/23/2015  INDICATION: 75 year old with a pelvic mass that is suspicious for malignancy. Patient has bilateral hydronephrosis and renal insufficiency. Request for bilateral nephrostomy tube placement. EXAM: PLACEMENT OF BILATERAL NEPHROSTOMY TUBES WITH ULTRASOUND AND FLUOROSCOPIC GUIDANCE COMPARISON:  None. MEDICATIONS: Ciprofloxacin 400 mg IV; The antibiotic was administered in an appropriate time frame prior to skin puncture. ANESTHESIA/SEDATION: Fentanyl 75 mcg IV; Versed 0.5 mg IV Moderate Sedation Time:  30 minutes The patient was continuously monitored during the procedure by the interventional radiology nurse under my direct supervision. CONTRAST:  20 mL - administered into the collecting system(s) FLUOROSCOPY TIME:  Fluoroscopy Time: 1 minutes 36 seconds COMPLICATIONS: None immediate. PROCEDURE: Informed written consent was obtained from the patient after a thorough discussion of the procedural risks, benefits and alternatives. All questions were addressed. Consent was obtained using a Patent attorney. Maximal Sterile Barrier Technique was utilized including caps, mask, sterile gowns, sterile gloves, sterile drape, hand hygiene and skin antiseptic. A timeout was performed prior to the initiation of the procedure. Patient was placed prone. Both flanks were prepped and draped in a sterile fashion. Ultrasound confirmed hydronephrosis in left kidney. Skin was anesthetized with 1% lidocaine. [place] needle was directed into a dilated mid pole calyx with ultrasound guidance. Contrast injection confirmed placement in the renal collecting system. A 0.018 wire was advanced into the renal pelvis. Accustick dilator set was placed. The tract was dilated to accommodate a 10.2 [place] multipurpose drain. Drain was reconstituted in the renal pelvis. Small amount of contrast was injected to confirm placement in the renal pelvis. Catheter was attached to gravity bag and sutured to the skin.  Attention was directed to the right kidney.  Right flank was anesthetized with 1% lidocaine. Targeting of the right kidney was more difficult than the left due to close proximity of the right hepatic lobe. A lower pole calyx was targeted for a posterior approach. Needle was directed into the calyx with ultrasound guidance. Wire was easily advanced into the renal pelvis. The tract was dilated and Accustick dilator placed. A 10.2 [place] multipurpose drain was reconstituted in the renal pelvis. Contrast injection confirmed placement in the renal pelvis. Right nephrostomy tube was sutured to the skin. A sample of urine was obtained from both nephrostomy tubes and sent for culture. Fluoroscopic and ultrasound images were taken and saved for documentation. FINDINGS: Moderate to severe hydronephrosis in both kidneys. Nephrostomy tubes were placed and reconstituted in the renal pelvis bilaterally. Mildly cloudy urine was identified from both nephrostomy tubes. IMPRESSION: Successful placement of bilateral nephrostomy tubes with ultrasound and fluoroscopic guidance. Electronically Signed   By: Markus Daft M.D.   On: 07/23/2015 17:41   Ir Nephrostomy Placement Right  07/23/2015  INDICATION: 75 year old with a pelvic mass that is suspicious for malignancy. Patient has bilateral hydronephrosis and renal insufficiency. Request for bilateral nephrostomy tube placement. EXAM: PLACEMENT OF BILATERAL NEPHROSTOMY TUBES WITH ULTRASOUND AND FLUOROSCOPIC GUIDANCE COMPARISON:  None. MEDICATIONS: Ciprofloxacin 400 mg IV; The antibiotic was administered in an appropriate time frame prior to skin puncture. ANESTHESIA/SEDATION: Fentanyl 75 mcg IV; Versed 0.5 mg IV Moderate Sedation Time:  30 minutes The patient was continuously monitored during the procedure by the interventional radiology nurse under my direct supervision. CONTRAST:  20 mL - administered into the collecting system(s) FLUOROSCOPY TIME:  Fluoroscopy Time: 1 minutes 36 seconds  COMPLICATIONS: None immediate. PROCEDURE: Informed written consent was obtained from the patient after a thorough discussion of the procedural risks, benefits and alternatives. All questions were addressed. Consent was obtained using a Patent attorney. Maximal Sterile Barrier Technique was utilized including caps, mask, sterile gowns, sterile gloves, sterile drape, hand hygiene and skin antiseptic. A timeout was performed prior to the initiation of the procedure. Patient was placed prone. Both flanks were prepped and draped in a sterile fashion. Ultrasound confirmed hydronephrosis in left kidney. Skin was anesthetized with 1% lidocaine. [place] needle was directed into a dilated mid pole calyx with ultrasound guidance. Contrast injection confirmed placement in the renal collecting system. A 0.018 wire was advanced into the renal pelvis. Accustick dilator set was placed. The tract was dilated to accommodate a 10.2 [place] multipurpose drain. Drain was reconstituted in the renal pelvis. Small amount of contrast was injected to confirm placement in the renal pelvis. Catheter was attached to gravity bag and sutured to the skin. Attention was directed to the right kidney. Right flank was anesthetized with 1% lidocaine. Targeting of the right kidney was more difficult than the left due to close proximity of the right hepatic lobe. A lower pole calyx was targeted for a posterior approach. Needle was directed into the calyx with ultrasound guidance. Wire was easily advanced into the renal pelvis. The tract was dilated and Accustick dilator placed. A 10.2 [place] multipurpose drain was reconstituted in the renal pelvis. Contrast injection confirmed placement in the renal pelvis. Right nephrostomy tube was sutured to the skin. A sample of urine was obtained from both nephrostomy tubes and sent for culture. Fluoroscopic and ultrasound images were taken and saved for documentation. FINDINGS: Moderate to severe  hydronephrosis in both kidneys. Nephrostomy tubes were placed and reconstituted in the renal pelvis bilaterally. Mildly cloudy urine was identified  from both nephrostomy tubes. IMPRESSION: Successful placement of bilateral nephrostomy tubes with ultrasound and fluoroscopic guidance. Electronically Signed   By: Markus Daft M.D.   On: 07/23/2015 17:41   Admission HPI: Heidi [name] is a 75 year old [place] lady who recently moved to the [place] in November 2016 with type 2 diabetes but no other known medical problems presenting with suprapubic abdominal pain, dysuria, and fever.  Last week, she began feeling suprapubic abdominal pain and dysuria; this was followed by fever and chills. She denies flank pain. She has had numerous urinary tract infections over the last 20 years in [place]. Additionally, she was transfused blood some time last Summer before moving to the [place] but she does not know why. She has been losing weight for the last few years but is not sure why. She denies any epigastric abdominal pain, melena, hematochezia, early satiety, night sweats, lymphadenopathy, bone pain, or cough; review of systems was otherwise non-revealing.  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 CT abdomen/pelvis done showed bilateral hydronephrosis, moderate-to-severe in degree, favored to be chronic given the lack of significant perinephric inflammation. Poorly defined soft tissue mass/thickening within the pelvis, surrounding the bladder and within the bilateral adnexal regions, difficult to definitively characterize without intravascular contrast. This poorly defined soft tissue mass/thickening is the likely source of the bilateral obstructive uropathy. This may represent local spread of neoplastic mass related to the given history of endometrial cancer. Evaluated by Gyn-Onc during  admission. Recommended follow up outpatient for pelvic examination for biopsies. Has appointment scheduled for 5//8.   Renal insufficiency with bilateral hydronephrosis: Likely long standing likely 2/2 malignancy. Cr trended down during admission from 3.3 ->2.45 at discharge. Unclear baseline. Seen by urology. Had bilateral nephrostomy tubes placed 5/2. Clear, non-bloody urine present at discharge. Pain well controlled with oxy 5 q6hr prn. Follow up with urology and IR.   Complicated urinary tract infection: BCx 1/2 tubes grew coag negative staph. Likely contaminant. Recultured 5/1 with no growth. UCx grew pansensitive Klebsiella. Urine cultures from b/l nephrostomy placement with no growth. Received ceftriaxone 4/29 >>5/1, ancef 5/2 >> 5/2. Transitioned to keflex PO to finish 10 day abx course, stop date 5/9.  Normocytic anemia: Her hemoglobin corrected appropriately from 6 to 9 after she received 3U PRBCs. She appears to be iron-deficient suggestive of chronic bleeding. She also seems to have had endometrial cancer in the past but denied any vaginal bleeding. Likely anemia 2/2 underlying malignancy. Hgb was stable during admission after initial transfusion.   Discharge Vitals:   BP 118/62 mmHg  Pulse 82  Temp(Src) 97.4 F (36.3 C) (Oral)  Resp 16  Ht 5' (1.524 m)  Wt 98 lb 12.3 oz (44.8 kg)  BMI 19.29 kg/m2  SpO2 98%  Discharge Labs:  Results for orders placed or performed during the hospital encounter of 07/20/15 (from the past 24 hour(s))  Glucose, capillary     Status: Abnormal   Collection Time: 07/23/15 11:56 AM  Result Value Ref Range   Glucose-Capillary 187 (H) 65 - 99 mg/dL  Urine culture     Status: None (Preliminary result)   Collection Time: 07/23/15  5:20 PM  Result Value Ref Range   Specimen Description URINE, RANDOM    Special Requests LEFT NEPHROSTOMY    Culture PENDING    Report Status PENDING   Glucose, capillary     Status: Abnormal   Collection  Time: 07/23/15   5:21 PM  Result Value Ref Range   Glucose-Capillary 113 (H) 65 - 99 mg/dL  Glucose, capillary     Status: Abnormal   Collection Time: 07/23/15 10:49 PM  Result Value Ref Range   Glucose-Capillary 150 (H) 65 - 99 mg/dL  Basic metabolic panel     Status: Abnormal   Collection Time: 07/24/15  4:34 AM  Result Value Ref Range   Sodium 134 (L) 135 - 145 mmol/L   Potassium 4.2 3.5 - 5.1 mmol/L   Chloride 106 101 - 111 mmol/L   CO2 19 (L) 22 - 32 mmol/L   Glucose, Bld 111 (H) 65 - 99 mg/dL   BUN 37 (H) 6 - 20 mg/dL   Creatinine, Ser 2.58 (H) 0.44 - 1.00 mg/dL   Calcium 8.8 (L) 8.9 - 10.3 mg/dL   GFR calc non Af Amer 17 (L) >60 mL/min   GFR calc Af Amer 20 (L) >60 mL/min   Anion gap 9 5 - 15    Signed: Maryellen Pile, MD 07/24/2015, 8:40 AM

## 2015-07-24 NOTE — Progress Notes (Signed)
Patient ID: Heidi [name], female   DOB: 09-Feb-1941, 75 y.o.   MRN: [id number]    Referring Physician(s): [place]  Supervising Physician: Daryll Brod  Patient Status: In-pt  Chief Complaint:  Bilateral obstructive hydronephrosis, pelvic mass, renal insufficiency  Subjective: Pt with some soreness at both flank regions as expected post PCN placement; currently denies fever/chills, N/V; granddaughter at bedside(speaks English)   Allergies: Penicillins  Medications: Prior to Admission medications   Medication Sig Start Date End Date Taking? Authorizing Provider  glyBURIDE-metformin (GLUCOVANCE) 5-500 MG tablet Take 1 tablet by mouth 2 (two) times daily as needed (for high blood sugar). Blood sugar > 150 then pt takes, if < 95 pt does not take   Yes Historical Provider, MD     Vital Signs: BP 137/58 mmHg  Pulse 78  Temp(Src) 98.4 F (36.9 C) (Oral)  Resp 16  Ht 5' (1.524 m)  Wt 98 lb 12.3 oz (44.8 kg)  BMI 19.29 kg/m2  SpO2 98%  Physical Exam bilat PCN's intact, dressings dry, sites mildly tender to palpation; outputs  R/L- 400/350 cc; blood tinged urine on right, light yellow on left  Imaging: Ct Abdomen Pelvis Wo Contrast  07/21/2015  CLINICAL DATA:  Obstructive uropathy, hematuria. History of endometrial cancer. EXAM: CT ABDOMEN AND PELVIS WITHOUT CONTRAST TECHNIQUE: Multidetector CT imaging of the abdomen and pelvis was performed following the standard protocol without IV contrast. COMPARISON:  None. FINDINGS: Study is limited by the lack of intravascular contrast. Lower chest:  There is mild bibasilar atelectasis. Hepatobiliary: No mass visualized within the liver on this un-enhanced exam. Stones are seen within the contracted gallbladder but there is no pericholecystic fluid or other secondary signs of an acute cholecystitis. No bile duct dilatation appreciated. Pancreas: Diminutive but otherwise unremarkable. No peripancreatic fluid. Spleen: Within  normal limits in size. Adrenals/Urinary Tract: There is bilateral hydronephrosis, moderate to severe in degree, and bilateral hydroureter. There is irregular soft tissue thickening/mass within the pelvis (posterior and above the level of the bladder) which is the likely source of the bilateral ureteral obstruction. Bladder is decompressed by Foley catheter. Stomach/Bowel: No dilated large or small bowel loops. Appendix is not seen. Stomach appears normal. Vascular/Lymphatic: Heavy atherosclerotic changes are seen along the walls of the normal- caliber abdominal aorta and throughout the pelvic vasculature. Reproductive: Soft tissue mass/thickening within the pelvis may be related to the uterus given the provided history of endometrial cancer. This soft tissue mass/thickening extends into the bilateral adnexal regions. Normal structures are not clearly delineated. Multiple surgical clips are seen within the pelvis. Other: No free fluid or abscess collection identified. No free intraperitoneal air seen. Musculoskeletal: Degenerative changes of the thoracolumbar spine and degenerative changes within the pelvis. No acute or suspicious osseous lesion. IMPRESSION: 1. Bilateral hydronephrosis, moderate-to-severe in degree, favored to be chronic given the lack of significant perinephric inflammation. Poorly defined soft tissue mass/thickening within the pelvis, surrounding the bladder and within the bilateral adnexal regions, difficult to definitively characterize without intravascular contrast. This poorly defined soft tissue mass/thickening is the likely source of the bilateral obstructive uropathy. This may represent local spread of neoplastic mass related to the given history of endometrial cancer. Alternatively, this could represent postsurgical and/or post-treatment changes (scarring/fibrosis). There are no prior studies available for assessment of chronicity. 2. Bladder partially decompressed by Foley catheter. As  above, there is soft tissue mass/thickening circumferentially about the bladder. This may represent bladder wall thickening or an adjacent soft tissue encasement. 3. Surgical clips are noted  along the bilateral pelvic sidewalls. Previous tumor resection? 4. Cholelithiasis without evidence of acute cholecystitis. These results were called by telephone at the time of interpretation on 07/21/2015 at 3:45 pm to Dr. Nicole Kindred, who verbally acknowledged these results. Electronically Signed   By: Franki Cabot M.D.   On: 07/21/2015 15:47   US Renal  07/21/2015  CLINICAL DATA:  Elevated serum creatinine EXAM: RENAL / URINARY TRACT ULTRASOUND COMPLETE COMPARISON:  None. FINDINGS: Right Kidney: Length: 11.5 cm.  Hydronephrosis.  Mild cortical thinning. Left Kidney: Length: 12 cm.  Hydronephrosis.  Mild cortical thinning. Bladder: Distended with internal debris. No detectable wall thickening. Postvoid bladder volume is essentially unchanged. These results were called by telephone at the time of interpretation on 07/21/2015 at 12:37 am to Dr. Merrilyn Puma , who verbally acknowledged these results. IMPRESSION: 1. Bilateral hydronephrosis and distended bladder compatible with obstructive uropathy. 2. Debris in the bladder, usually infectious. Electronically Signed   By: Monte Fantasia M.D.   On: 07/21/2015 00:43   Ir Nephrostomy Placement Left  07/23/2015  INDICATION: 75 year old with a pelvic mass that is suspicious for malignancy. Patient has bilateral hydronephrosis and renal insufficiency. Request for bilateral nephrostomy tube placement. EXAM: PLACEMENT OF BILATERAL NEPHROSTOMY TUBES WITH ULTRASOUND AND FLUOROSCOPIC GUIDANCE COMPARISON:  None. MEDICATIONS: Ciprofloxacin 400 mg IV; The antibiotic was administered in an appropriate time frame prior to skin puncture. ANESTHESIA/SEDATION: Fentanyl 75 mcg IV; Versed 0.5 mg IV Moderate Sedation Time:  30 minutes The patient was continuously monitored during the procedure by the  interventional radiology nurse under my direct supervision. CONTRAST:  20 mL - administered into the collecting system(s) FLUOROSCOPY TIME:  Fluoroscopy Time: 1 minutes 36 seconds COMPLICATIONS: None immediate. PROCEDURE: Informed written consent was obtained from the patient after a thorough discussion of the procedural risks, benefits and alternatives. All questions were addressed. Consent was obtained using a Patent attorney. Maximal Sterile Barrier Technique was utilized including caps, mask, sterile gowns, sterile gloves, sterile drape, hand hygiene and skin antiseptic. A timeout was performed prior to the initiation of the procedure. Patient was placed prone. Both flanks were prepped and draped in a sterile fashion. Ultrasound confirmed hydronephrosis in left kidney. Skin was anesthetized with 1% lidocaine. [place] needle was directed into a dilated mid pole calyx with ultrasound guidance. Contrast injection confirmed placement in the renal collecting system. A 0.018 wire was advanced into the renal pelvis. Accustick dilator set was placed. The tract was dilated to accommodate a 10.2 [place] multipurpose drain. Drain was reconstituted in the renal pelvis. Small amount of contrast was injected to confirm placement in the renal pelvis. Catheter was attached to gravity bag and sutured to the skin. Attention was directed to the right kidney. Right flank was anesthetized with 1% lidocaine. Targeting of the right kidney was more difficult than the left due to close proximity of the right hepatic lobe. A lower pole calyx was targeted for a posterior approach. Needle was directed into the calyx with ultrasound guidance. Wire was easily advanced into the renal pelvis. The tract was dilated and Accustick dilator placed. A 10.2 [place] multipurpose drain was reconstituted in the renal pelvis. Contrast injection confirmed placement in the renal pelvis. Right nephrostomy tube was sutured to the skin. A sample of  urine was obtained from both nephrostomy tubes and sent for culture. Fluoroscopic and ultrasound images were taken and saved for documentation. FINDINGS: Moderate to severe hydronephrosis in both kidneys. Nephrostomy tubes were placed and reconstituted in the renal pelvis bilaterally. Mildly  cloudy urine was identified from both nephrostomy tubes. IMPRESSION: Successful placement of bilateral nephrostomy tubes with ultrasound and fluoroscopic guidance. Electronically Signed   By: Markus Daft M.D.   On: 07/23/2015 17:41   Ir Nephrostomy Placement Right  07/23/2015  INDICATION: 75 year old with a pelvic mass that is suspicious for malignancy. Patient has bilateral hydronephrosis and renal insufficiency. Request for bilateral nephrostomy tube placement. EXAM: PLACEMENT OF BILATERAL NEPHROSTOMY TUBES WITH ULTRASOUND AND FLUOROSCOPIC GUIDANCE COMPARISON:  None. MEDICATIONS: Ciprofloxacin 400 mg IV; The antibiotic was administered in an appropriate time frame prior to skin puncture. ANESTHESIA/SEDATION: Fentanyl 75 mcg IV; Versed 0.5 mg IV Moderate Sedation Time:  30 minutes The patient was continuously monitored during the procedure by the interventional radiology nurse under my direct supervision. CONTRAST:  20 mL - administered into the collecting system(s) FLUOROSCOPY TIME:  Fluoroscopy Time: 1 minutes 36 seconds COMPLICATIONS: None immediate. PROCEDURE: Informed written consent was obtained from the patient after a thorough discussion of the procedural risks, benefits and alternatives. All questions were addressed. Consent was obtained using a Patent attorney. Maximal Sterile Barrier Technique was utilized including caps, mask, sterile gowns, sterile gloves, sterile drape, hand hygiene and skin antiseptic. A timeout was performed prior to the initiation of the procedure. Patient was placed prone. Both flanks were prepped and draped in a sterile fashion. Ultrasound confirmed hydronephrosis in left kidney. Skin was  anesthetized with 1% lidocaine. Kress needle was directed into a dilated mid pole calyx with ultrasound guidance. Contrast injection confirmed placement in the renal collecting system. A 0.018 wire was advanced into the renal pelvis. Accustick dilator set was placed. The tract was dilated to accommodate a 10.2 [place] multipurpose drain. Drain was reconstituted in the renal pelvis. Small amount of contrast was injected to confirm placement in the renal pelvis. Catheter was attached to gravity bag and sutured to the skin. Attention was directed to the right kidney. Right flank was anesthetized with 1% lidocaine. Targeting of the right kidney was more difficult than the left due to close proximity of the right hepatic lobe. A lower pole calyx was targeted for a posterior approach. Needle was directed into the calyx with ultrasound guidance. Wire was easily advanced into the renal pelvis. The tract was dilated and Accustick dilator placed. A 10.2 [place] multipurpose drain was reconstituted in the renal pelvis. Contrast injection confirmed placement in the renal pelvis. Right nephrostomy tube was sutured to the skin. A sample of urine was obtained from both nephrostomy tubes and sent for culture. Fluoroscopic and ultrasound images were taken and saved for documentation. FINDINGS: Moderate to severe hydronephrosis in both kidneys. Nephrostomy tubes were placed and reconstituted in the renal pelvis bilaterally. Mildly cloudy urine was identified from both nephrostomy tubes. IMPRESSION: Successful placement of bilateral nephrostomy tubes with ultrasound and fluoroscopic guidance. Electronically Signed   By: Markus Daft M.D.   On: 07/23/2015 17:41    Labs:  CBC:  Recent Labs  07/20/15 1735 07/21/15 0841 07/22/15 0539 07/23/15 0559  WBC 10.6* 13.0* 9.3 7.8  HGB 5.5* 9.7* 9.5* 10.3*  HCT 17.2* 29.2* 28.2* 30.8*  PLT 263 219 225 266    COAGS:  Recent Labs  07/20/15 1841  INR 1.34     BMP:  Recent Labs  07/21/15 0841 07/22/15 0539 07/23/15 0559 07/24/15 0434  NA 138 137 138 134*  K 4.5 4.3 4.4 4.2  CL 111 110 109 106  CO2 16* 18* 17* 19*  GLUCOSE 136* 86 97 111*  BUN  40* 35* 37* 37*  CALCIUM 8.4* 8.7* 9.0 8.8*  CREATININE 3.22* 2.97* 2.70* 2.58*  GFRNONAA 13* 14* 16* 17*  GFRAA 15* 17* 19* 20*    LIVER FUNCTION TESTS:  Recent Labs  07/20/15 1519  BILITOT 0.3  AST 13*  ALT 8*  ALKPHOS 84  PROT 7.6  ALBUMIN 2.3*    Assessment and Plan: S/p bilat PCN's 5/2 secondary to obstructive hydro from pelvic mass/renal insuff; AF; urine cx's pending; creat 2.58(2.70); send urine in PCN's for cytology; additional plans as per urology   Electronically Signed: D. Rowe Robert 07/24/2015, 10:23 AM   I spent a total of 15 minutes at the the patient's bedside AND on the patient's hospital floor or unit, greater than 50% of which was counseling/coordinating care for bilateral nephrostomies

## 2015-07-24 NOTE — Progress Notes (Signed)
Patient ID: Heidi [name], female   DOB: 1940/04/17, 75 y.o.   MRN: [id number]   Subjective: [name] dysuria and suprapubic abdominal pain have improved today.  Denies any vaginal bleeding or hematuria. She has no complaints.  Objective: Vital signs in last 24 hours: Filed Vitals:   07/23/15 1700 07/23/15 1738 07/23/15 2250 07/24/15 0525  BP: 150/87 147/66 130/53 118/62  Pulse: 82 86 80 82  Temp:  98.6 F (37 C) 97.8 F (36.6 C) 97.4 F (36.3 C)  TempSrc:  Oral Oral Oral  Resp: 19 18 18 16   Height:      Weight:    98 lb 12.3 oz (44.8 kg)  SpO2: 100% 97% 95% 98%   General: elderly, friendly Heidi [name] lady resting in bed comfortably HEENT: pale conjuctiva, extra-ocular muscles intact, oropharynx without lesions Cardiac: regular rate and rhythm, no rubs, murmurs or gallops Pulm: breathing well, clear to auscultation bilaterally Abd: bowel sounds normal, soft, nondistended, suprapubic abdominal tenderness, no CVA tenderness Ext: warm and well perfused, without pedal edema Lymph: no cervical or supraclavicular lymphadenopathy Skin: half and half nails; no other skin lesions Neuro: alert and oriented X3, cranial nerves II-XII grossly intact, moving all extremities well  Lab Results: Basic Metabolic Panel:  Recent Labs Lab 07/23/15 0559 07/24/15 0434  NA 138 134*  K 4.4 4.2  CL 109 106  CO2 17* 19*  GLUCOSE 97 111*  BUN 37* 37*  CREATININE 2.70* 2.58*  CALCIUM 9.0 8.8*   CBC:  Recent Labs Lab 07/20/15 1735  07/22/15 0539 07/23/15 0559  WBC 10.6*  < > 9.3 7.8  NEUTROABS 8.1*  --   --   --   HGB 5.5*  < > 9.5* 10.3*  HCT 17.2*  < > 28.2* 30.8*  MCV 93.0  < > 87.6 86.5  PLT 263  < > 225 266  < > = values in this interval not displayed. Anemia Panel:  Recent Labs Lab 07/20/15 1735 07/20/15 1736  VITAMINB12 421  --   FOLATE  --  23.0  FERRITIN 279  --   TIBC 225*  --   IRON 17*  --   RETICCTPCT 2.7  --    Studies/Results: Ir Nephrostomy  Placement Left  07/23/2015  INDICATION: 75 year old with a pelvic mass that is suspicious for malignancy. Patient has bilateral hydronephrosis and renal insufficiency. Request for bilateral nephrostomy tube placement. EXAM: PLACEMENT OF BILATERAL NEPHROSTOMY TUBES WITH ULTRASOUND AND FLUOROSCOPIC GUIDANCE COMPARISON:  None. MEDICATIONS: Ciprofloxacin 400 mg IV; The antibiotic was administered in an appropriate time frame prior to skin puncture. ANESTHESIA/SEDATION: Fentanyl 75 mcg IV; Versed 0.5 mg IV Moderate Sedation Time:  30 minutes The patient was continuously monitored during the procedure by the interventional radiology nurse under my direct supervision. CONTRAST:  20 mL - administered into the collecting system(s) FLUOROSCOPY TIME:  Fluoroscopy Time: 1 minutes 36 seconds COMPLICATIONS: None immediate. PROCEDURE: Informed written consent was obtained from the patient after a thorough discussion of the procedural risks, benefits and alternatives. All questions were addressed. Consent was obtained using a Patent attorney. Maximal Sterile Barrier Technique was utilized including caps, mask, sterile gowns, sterile gloves, sterile drape, hand hygiene and skin antiseptic. A timeout was performed prior to the initiation of the procedure. Patient was placed prone. Both flanks were prepped and draped in a sterile fashion. Ultrasound confirmed hydronephrosis in left kidney. Skin was anesthetized with 1% lidocaine. Heidi [name] was directed into a dilated mid pole calyx with ultrasound guidance.  Contrast injection confirmed placement in the renal collecting system. A 0.018 wire was advanced into the renal pelvis. Accustick dilator set was placed. The tract was dilated to accommodate a 10.2 [place] multipurpose drain. Drain was reconstituted in the renal pelvis. Small amount of contrast was injected to confirm placement in the renal pelvis. Catheter was attached to gravity bag and sutured to the skin.  Attention was directed to the right kidney. Right flank was anesthetized with 1% lidocaine. Targeting of the right kidney was more difficult than the left due to close proximity of the right hepatic lobe. A lower pole calyx was targeted for a posterior approach. [name] was directed into the calyx with ultrasound guidance. Wire was easily advanced into the renal pelvis. The tract was dilated and Accustick dilator placed. A 10.2 [place] multipurpose drain was reconstituted in the renal pelvis. Contrast injection confirmed placement in the renal pelvis. Right nephrostomy tube was sutured to the skin. A sample of urine was obtained from both nephrostomy tubes and sent for culture. Fluoroscopic and ultrasound images were taken and saved for documentation. FINDINGS: Moderate to severe hydronephrosis in both kidneys. Nephrostomy tubes were placed and reconstituted in the renal pelvis bilaterally. Mildly cloudy urine was identified from both nephrostomy tubes. IMPRESSION: Successful placement of bilateral nephrostomy tubes with ultrasound and fluoroscopic guidance. Electronically Signed   By: Markus Daft M.D.   On: 07/23/2015 17:41   Ir Nephrostomy Placement Right  07/23/2015  INDICATION: 75 year old with a pelvic mass that is suspicious for malignancy. Patient has bilateral hydronephrosis and renal insufficiency. Request for bilateral nephrostomy tube placement. EXAM: PLACEMENT OF BILATERAL NEPHROSTOMY TUBES WITH ULTRASOUND AND FLUOROSCOPIC GUIDANCE COMPARISON:  None. MEDICATIONS: Ciprofloxacin 400 mg IV; The antibiotic was administered in an appropriate time frame prior to skin puncture. ANESTHESIA/SEDATION: Fentanyl 75 mcg IV; Versed 0.5 mg IV Moderate Sedation Time:  30 minutes The patient was continuously monitored during the procedure by the interventional radiology nurse under my direct supervision. CONTRAST:  20 mL - administered into the collecting system(s) FLUOROSCOPY TIME:  Fluoroscopy Time: 1 minutes 36 seconds  COMPLICATIONS: None immediate. PROCEDURE: Informed written consent was obtained from the patient after a thorough discussion of the procedural risks, benefits and alternatives. All questions were addressed. Consent was obtained using a Patent attorney. Maximal Sterile Barrier Technique was utilized including caps, mask, sterile gowns, sterile gloves, sterile drape, hand hygiene and skin antiseptic. A timeout was performed prior to the initiation of the procedure. Patient was placed prone. Both flanks were prepped and draped in a sterile fashion. Ultrasound confirmed hydronephrosis in left kidney. Skin was anesthetized with 1% lidocaine. Heidi [name] [name] was directed into a dilated mid pole calyx with ultrasound guidance. Contrast injection confirmed placement in the renal collecting system. A 0.018 wire was advanced into the renal pelvis. Accustick dilator set was placed. The tract was dilated to accommodate a 10.2 [place] multipurpose drain. Drain was reconstituted in the renal pelvis. Small amount of contrast was injected to confirm placement in the renal pelvis. Catheter was attached to gravity bag and sutured to the skin. Attention was directed to the right kidney. Right flank was anesthetized with 1% lidocaine. Targeting of the right kidney was more difficult than the left due to close proximity of the right hepatic lobe. A lower pole calyx was targeted for a posterior approach. [name] was directed into the calyx with ultrasound guidance. Wire was easily advanced into the renal pelvis. The tract was dilated and Accustick dilator placed. A 10.2 [place]  multipurpose drain was reconstituted in the renal pelvis. Contrast injection confirmed placement in the renal pelvis. Right nephrostomy tube was sutured to the skin. A sample of urine was obtained from both nephrostomy tubes and sent for culture. Fluoroscopic and ultrasound images were taken and saved for documentation. FINDINGS: Moderate to severe  hydronephrosis in both kidneys. Nephrostomy tubes were placed and reconstituted in the renal pelvis bilaterally. Mildly cloudy urine was identified from both nephrostomy tubes. IMPRESSION: Successful placement of bilateral nephrostomy tubes with ultrasound and fluoroscopic guidance. Electronically Signed   By: Markus Daft M.D.   On: 07/23/2015 17:41   Medications: I have reviewed the patient's current medications. Scheduled Meds: . sodium chloride  10 mL/hr Intravenous Once  . cephALEXin  500 mg Oral Q12H  . insulin aspart  0-9 Units Subcutaneous TID WC  . polyethylene glycol  17 g Oral Daily   Continuous Infusions:  PRN Meds:.  Assessment/Plan:  Heidi [name] is a friendly 75 year old Heidi [name] lady with type 2 diabetes presenting with a complicated urinary tract infection, normocytic anemia, and obstructive uropathy.  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 Surrounding bladder and tihin the bilateral adnexal regions. Spoke with Gyn-Onc, suspicious for cervical cancer.  -Appreciate gyn-onc help, will need follow up at discharge  Renal insufficiency with bilateral hydronephrosis: Likely long standing 2/2 malignancy. Cr improving but unclear baseline. Had bilateral nephrostomy tubes placed 5/2 evening. Patient reports some pain from yesterday but well controlled on oxy prn.  -Appreciate urology consult, will need urology follow up at discharge -Appreciate IR consult -Oxy 5 mg H7SF prn  Complicated urinary tract infection: She is improving with IV ceftriaxone. BCx 1/2 tubes growing coag negative staph. Likely contaminant. Recultured 5/1, NGTD. UCx growing pansensitive Klebsiella.  -ceftriaxone 4/29 >>5/1, ancef 5/2 -start keflex PO to finish 10 day abx course, stop date 5/9 -Follow blood cultures -Follow up nephrostomy  cultures  Normocytic anemia: Her hemoglobin corrected appropriately from 6 to 9 after she received 3U PRBCs so I doubt hemolysis. She appears to be iron-deficient suggestive of chronic bleeding. She also seems to have had endometrial cancer in the past but denies any vaginal bleeding. Given her history of cancer and obstructive uropathy, we'll get a CT abd/pelvis without contrast today to look for malignancy. Another consideration is multiple myeloma given her concomitant renal disease, but she's not hypercalcemic; we'll send MM work-up labs. Likely 2/2 underlying malignancy.  -No M-spike, SPEP elevated likely 2/2 renal disease -IFE pending   Dispo: Anticipate discharge in 1-2 days  The patient does not have a current PCP (No Pcp Per Patient) and does need an [facility] hospital follow-up appointment after discharge.  The patient does have transportation limitations that hinder transportation to clinic appointments.  .Services Needed at time of discharge: Y = Yes, Blank = No PT:   OT:   RN:   Equipment:   Other:     LOS: 4 days   Maryellen Pile, MD 07/24/2015, 8:31 AM

## 2015-07-25 DIAGNOSIS — R7989 Other specified abnormal findings of blood chemistry: Secondary | ICD-10-CM | POA: Insufficient documentation

## 2015-07-25 DIAGNOSIS — Z8542 Personal history of malignant neoplasm of other parts of uterus: Secondary | ICD-10-CM

## 2015-07-25 DIAGNOSIS — Z8541 Personal history of malignant neoplasm of cervix uteri: Secondary | ICD-10-CM

## 2015-07-25 DIAGNOSIS — Z936 Other artificial openings of urinary tract status: Secondary | ICD-10-CM

## 2015-07-25 LAB — BASIC METABOLIC PANEL
Anion gap: 12 (ref 5–15)
BUN: 38 mg/dL — AB (ref 6–20)
CALCIUM: 8.8 mg/dL — AB (ref 8.9–10.3)
CO2: 18 mmol/L — ABNORMAL LOW (ref 22–32)
CREATININE: 2.45 mg/dL — AB (ref 0.44–1.00)
Chloride: 104 mmol/L (ref 101–111)
GFR, EST AFRICAN AMERICAN: 21 mL/min — AB (ref >60)
GFR, EST NON AFRICAN AMERICAN: 18 mL/min — AB (ref >60)
Glucose, Bld: 131 mg/dL — ABNORMAL HIGH (ref 65–99)
Potassium: 4 mmol/L (ref 3.5–5.1)
SODIUM: 134 mmol/L — AB (ref 135–145)

## 2015-07-25 LAB — GLUCOSE, CAPILLARY
GLUCOSE-CAPILLARY: 254 mg/dL — AB (ref 65–99)
Glucose-Capillary: 128 mg/dL — ABNORMAL HIGH (ref 65–99)
Glucose-Capillary: 218 mg/dL — ABNORMAL HIGH (ref 65–99)

## 2015-07-25 LAB — CULTURE, BLOOD (ROUTINE X 2): Culture: NO GROWTH

## 2015-07-25 LAB — URINE CULTURE
CULTURE: NO GROWTH
Culture: NO GROWTH

## 2015-07-25 MED ORDER — CEPHALEXIN 500 MG PO CAPS: 500.0000 mg | ORAL_CAPSULE | Freq: Two times a day (BID) | ORAL | Status: DC

## 2015-07-25 MED ORDER — SIMETHICONE 80 MG PO CHEW: 80.0000 mg | CHEWABLE_TABLET | Freq: Three times a day (TID) | ORAL | Status: DC

## 2015-07-25 MED ORDER — PANTOPRAZOLE SODIUM 40 MG PO TBEC
40.0000 mg | DELAYED_RELEASE_TABLET | Freq: Every day | ORAL | Status: DC
  Administered 2015-07-25: 40 mg via ORAL
  Filled 2015-07-25: qty 1

## 2015-07-25 MED ORDER — PANTOPRAZOLE SODIUM 40 MG PO TBEC: 40.0000 mg | DELAYED_RELEASE_TABLET | Freq: Every day | ORAL | Status: DC

## 2015-07-25 MED ORDER — SIMETHICONE 80 MG PO CHEW
80.0000 mg | CHEWABLE_TABLET | Freq: Three times a day (TID) | ORAL | Status: DC
  Administered 2015-07-25: 80 mg via ORAL
  Filled 2015-07-25: qty 1

## 2015-07-25 MED ORDER — OXYCODONE-ACETAMINOPHEN 5-325 MG PO TABS: 1.0000 | ORAL_TABLET | Freq: Four times a day (QID) | ORAL | Status: DC | PRN

## 2015-07-25 NOTE — Progress Notes (Signed)
Patient ID: Heidi [name], female   DOB: 1940/12/21, 75 y.o.   MRN: [id number]    Referring Physician(s): [place]  Supervising Physician: Dr. Markus Daft  Patient Status: In-pt  Chief Complaint:  Bilateral obstructive hydronephrosis, pelvic mass, renal insufficiency  Subjective: Pt with some soreness at both flank regions as expected post PCN placement; currently denies fever/chills, N/V granddaughter at bedside(speaks English)   Allergies: Penicillins  Medications:  Current facility-administered medications:  .  0.9 %  sodium chloride infusion, 10 mL/hr, Intravenous, Once, Leo Grosser, MD .  cephALEXin [facility]) capsule 500 mg, 500 mg, Oral, Q12H, Maryellen Pile, MD, 500 mg at 07/25/15 0852 .  insulin aspart (novoLOG) injection 0-9 Units, 0-9 Units, Subcutaneous, TID WC, Nischal Narendra, MD, 5 Units at 07/25/15 1235 .  oxyCODONE-acetaminophen (PERCOCET/ROXICET) 5-325 MG per tablet 1 tablet, 1 tablet, Oral, Q6H PRN, Collier Salina, MD, 1 tablet at 07/24/15 1422 .  pantoprazole (PROTONIX) EC tablet 40 mg, 40 mg, Oral, Daily, Maryellen Pile, MD, 40 mg at 07/25/15 1234 .  polyethylene glycol (MIRALAX / GLYCOLAX) packet 17 g, 17 g, Oral, Daily, Rushil Patel V, MD, 17 g at 07/25/15 0852 .  promethazine (PHENERGAN) tablet 12.5 mg, 12.5 mg, Oral, Q4H PRN, Iline Oven, MD, 12.5 mg at 07/24/15 1641 .  simethicone (MYLICON) chewable tablet 80 mg, 80 mg, Oral, TID PC & HS, Maryellen Pile, MD, 80 mg at 07/25/15 1234    Vital Signs: BP 144/62 mmHg  Pulse 85  Temp(Src) 98.3 F (36.8 C) (Oral)  Resp 16  Ht 5' (1.524 m)  Wt 98 lb 12.3 oz (44.8 kg)  BMI 19.29 kg/m2  SpO2 96%  Physical Exam bilat PCN's intact, dressings dry, sites mildly tender to palpation;  Output 963mL from right, 858mL from left  Imaging: Ir Nephrostomy Placement Left  07/23/2015  INDICATION: 75 year old with a pelvic mass that is suspicious for malignancy. Patient has bilateral  hydronephrosis and renal insufficiency. Request for bilateral nephrostomy tube placement. EXAM: PLACEMENT OF BILATERAL NEPHROSTOMY TUBES WITH ULTRASOUND AND FLUOROSCOPIC GUIDANCE COMPARISON:  None. MEDICATIONS: Ciprofloxacin 400 mg IV; The antibiotic was administered in an appropriate time frame prior to skin puncture. ANESTHESIA/SEDATION: Fentanyl 75 mcg IV; Versed 0.5 mg IV Moderate Sedation Time:  30 minutes The patient was continuously monitored during the procedure by the interventional radiology nurse under my direct supervision. CONTRAST:  20 mL - administered into the collecting system(s) FLUOROSCOPY TIME:  Fluoroscopy Time: 1 minutes 36 seconds COMPLICATIONS: None immediate. PROCEDURE: Informed written consent was obtained from the patient after a thorough discussion of the procedural risks, benefits and alternatives. All questions were addressed. Consent was obtained using a Patent attorney. Maximal Sterile Barrier Technique was utilized including caps, mask, sterile gowns, sterile gloves, sterile drape, hand hygiene and skin antiseptic. A timeout was performed prior to the initiation of the procedure. Patient was placed prone. Both flanks were prepped and draped in a sterile fashion. Ultrasound confirmed hydronephrosis in left kidney. Skin was anesthetized with 1% lidocaine. [place] needle was directed into a dilated mid pole calyx with ultrasound guidance. Contrast injection confirmed placement in the renal collecting system. A 0.018 wire was advanced into the renal pelvis. Accustick dilator set was placed. The tract was dilated to accommodate a 10.2 [place] multipurpose drain. Drain was reconstituted in the renal pelvis. Small amount of contrast was injected to confirm placement in the renal pelvis. Catheter was attached to gravity bag and sutured to the skin. Attention was directed to the right kidney.  Right flank was anesthetized with 1% lidocaine. Targeting of the right kidney was more  difficult than the left due to close proximity of the right hepatic lobe. A lower pole calyx was targeted for a posterior approach. Needle was directed into the calyx with ultrasound guidance. Wire was easily advanced into the renal pelvis. The tract was dilated and Accustick dilator placed. A 10.2 [place] multipurpose drain was reconstituted in the renal pelvis. Contrast injection confirmed placement in the renal pelvis. Right nephrostomy tube was sutured to the skin. A sample of urine was obtained from both nephrostomy tubes and sent for culture. Fluoroscopic and ultrasound images were taken and saved for documentation. FINDINGS: Moderate to severe hydronephrosis in both kidneys. Nephrostomy tubes were placed and reconstituted in the renal pelvis bilaterally. Mildly cloudy urine was identified from both nephrostomy tubes. IMPRESSION: Successful placement of bilateral nephrostomy tubes with ultrasound and fluoroscopic guidance. Electronically Signed   By: Markus Daft M.D.   On: 07/23/2015 17:41   Ir Nephrostomy Placement Right  07/23/2015  INDICATION: 75 year old with a pelvic mass that is suspicious for malignancy. Patient has bilateral hydronephrosis and renal insufficiency. Request for bilateral nephrostomy tube placement. EXAM: PLACEMENT OF BILATERAL NEPHROSTOMY TUBES WITH ULTRASOUND AND FLUOROSCOPIC GUIDANCE COMPARISON:  None. MEDICATIONS: Ciprofloxacin 400 mg IV; The antibiotic was administered in an appropriate time frame prior to skin puncture. ANESTHESIA/SEDATION: Fentanyl 75 mcg IV; Versed 0.5 mg IV Moderate Sedation Time:  30 minutes The patient was continuously monitored during the procedure by the interventional radiology nurse under my direct supervision. CONTRAST:  20 mL - administered into the collecting system(s) FLUOROSCOPY TIME:  Fluoroscopy Time: 1 minutes 36 seconds COMPLICATIONS: None immediate. PROCEDURE: Informed written consent was obtained from the patient after a thorough discussion of  the procedural risks, benefits and alternatives. All questions were addressed. Consent was obtained using a Patent attorney. Maximal Sterile Barrier Technique was utilized including caps, mask, sterile gowns, sterile gloves, sterile drape, hand hygiene and skin antiseptic. A timeout was performed prior to the initiation of the procedure. Patient was placed prone. Both flanks were prepped and draped in a sterile fashion. Ultrasound confirmed hydronephrosis in left kidney. Skin was anesthetized with 1% lidocaine. [place] needle was directed into a dilated mid pole calyx with ultrasound guidance. Contrast injection confirmed placement in the renal collecting system. A 0.018 wire was advanced into the renal pelvis. Accustick dilator set was placed. The tract was dilated to accommodate a 10.2 [place] multipurpose drain. Drain was reconstituted in the renal pelvis. Small amount of contrast was injected to confirm placement in the renal pelvis. Catheter was attached to gravity bag and sutured to the skin. Attention was directed to the right kidney. Right flank was anesthetized with 1% lidocaine. Targeting of the right kidney was more difficult than the left due to close proximity of the right hepatic lobe. A lower pole calyx was targeted for a posterior approach. Needle was directed into the calyx with ultrasound guidance. Wire was easily advanced into the renal pelvis. The tract was dilated and Accustick dilator placed. A 10.2 [place] multipurpose drain was reconstituted in the renal pelvis. Contrast injection confirmed placement in the renal pelvis. Right nephrostomy tube was sutured to the skin. A sample of urine was obtained from both nephrostomy tubes and sent for culture. Fluoroscopic and ultrasound images were taken and saved for documentation. FINDINGS: Moderate to severe hydronephrosis in both kidneys. Nephrostomy tubes were placed and reconstituted in the renal pelvis bilaterally. Mildly cloudy urine was  identified from both nephrostomy tubes. IMPRESSION: Successful placement of bilateral nephrostomy tubes with ultrasound and fluoroscopic guidance. Electronically Signed   By: Markus Daft M.D.   On: 07/23/2015 17:41    Labs:  CBC:  Recent Labs  07/20/15 1735 07/21/15 0841 07/22/15 0539 07/23/15 0559  WBC 10.6* 13.0* 9.3 7.8  HGB 5.5* 9.7* 9.5* 10.3*  HCT 17.2* 29.2* 28.2* 30.8*  PLT 263 219 225 266    COAGS:  Recent Labs  07/20/15 1841  INR 1.34    BMP:  Recent Labs  07/22/15 0539 07/23/15 0559 07/24/15 0434 07/25/15 0455  NA 137 138 134* 134*  K 4.3 4.4 4.2 4.0  CL 110 109 106 104  CO2 18* 17* 19* 18*  GLUCOSE 86 97 111* 131*  BUN 35* 37* 37* 38*  CALCIUM 8.7* 9.0 8.8* 8.8*  CREATININE 2.97* 2.70* 2.58* 2.45*  GFRNONAA 14* 16* 17* 18*  GFRAA 17* 19* 20* 21*    LIVER FUNCTION TESTS:  Recent Labs  07/20/15 1519  BILITOT 0.3  AST 13*  ALT 8*  ALKPHOS 84  PROT 7.6  ALBUMIN 2.3*    Assessment and Plan: S/p bilat PCN's 5/2 secondary to obstructive hydro from pelvic mass/renal insuff PCNs functioning well Will need follow up with Urology after discharge. If no immediate Urology plans, will need (B)OCN exchange with IR in about 4-6 weeks.   Electronically Signed: Ascencion Dike 07/25/2015, 2:07 PM   I spent a total of 15 minutes at the the patient's bedside AND on the patient's hospital floor or unit, greater than 50% of which was counseling/coordinating care for bilateral nephrostomies

## 2015-07-25 NOTE — Progress Notes (Signed)
Patient discharge teaching given to patient and patient's grand-daughter, including activity, diet, follow-up appoints, and medications. Patient verbalized understanding of all discharge instructions.  Instructions provided in Spanish & English. IV access was d/c'd. Vitals are stable. Skin is intact except as charted in most recent assessments. Pt to be escorted out by NT, to be driven home by family.  Jillyn Ledger, MBA, BSN, RN

## 2015-07-25 NOTE — Discharge Instructions (Addendum)
Please follow up with Dr. Denman George on 5/8 and Urology on 6/22.  Please fill the antibiotics, Keflex, prescription and continue to take it twice a day until there are none left.

## 2015-07-25 NOTE — Consult Note (Addendum)
Consult Note: Gyn-Onc  Consult was requested by Dr. Daryll Drown for the evaluation of [name] 75 y.o. female  CC:  Chief Complaint  Patient presents with  . Abdominal Pain  bilateral hydronephrosis History of cervical cancer Soft tissue density in central pelvis on CT imaging  Assessment/Plan:  [name]  is a 75 y.o.  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  I reviewed her CT images from admission.  She requires an in office pelvic examination for biopsies to confirm active recurrent malignancy. If present, she would not be a candidate for additional radiation, and therefore only chemotherapy or total pelvic exenteration (if a centralized mass without sidewall involvement and no evidence of distant mets). As part of this work-up she will require a PET scan.   An alternative diagnosis to recurrent cancer might be chronic retroperitoneal radiation-induced fibrosis causing distal ureteral obstruction. THis diagnosis is less likely given the abruptness of her presentation.  We have set up an appointment to see Ms Heidi [name] at the [facility] the next week. Phone number is [phone].  HPI: Heidi [name] is a 75 year old parous woman who is seen in consultation at the request of Dr Gilles Chiquito for pelvic tissue thickening on CT imaging, a history of cervical cancer and bilateral ureteral obstruction.  The patient was interviewed with her grand daughter who translated.  She reports a remote (2003) history of cervical cancer that was treated with primary chemoradiation. She had an ex lap as part of workup of her cancer, but hysterectomy was not performed. She had a complete response to primary therapy and did attend surveillance visits for many years and had no evidence of recurrence.  However, she developed abdominal pain and symptoms of urosepsis in April, 2017 and was admitted to  [facility] on 07/20/15. Upon admission her creatinine was noted to be very elevated (3.37). She had findings of Klebsiella pneumoniae bacteriuria and was treated with antibiotics. She underwent imaging of the abdomen and pelvis (non contrasted CT) which showed: Bilateral hydronephrosis, moderate-to-severe in degree, favored to be chronic given the lack of significant perinephric inflammation. Poorly defined soft tissue mass/thickening within the pelvis, surrounding the bladder and within the bilateral adnexal regions, difficult to definitively characterize without intravascular contrast. This poorly defined soft tissue mass/thickening is the likely source of the bilateral obstructive uropathy. This may represent local spread of neoplastic mass related to the given history of endometrial cancer. Alternatively, this could represent postsurgical and/or post-treatment changes (scarring/fibrosis). As above, there is soft tissue mass/thickening circumferentially about the bladder. This may represent bladder wall thickening or an adjacent soft tissue encasement. Surgical clips are noted along the bilateral pelvic sidewalls. Previous tumor resection?  She underwent bilateral percutaneous nephrostomy tube placement on 07/23/15 and received a consultation with Urologist, Dr Tresa Moore. Her creatinine trended down after placement of the percutaneous nephrostomy tubes.  The patient reports having no vaginal bleeding or abnormal discharge. She denies weight loss.  Current Meds:  No current facility-administered medications on file prior to encounter.   No current outpatient prescriptions on file prior to encounter.     Medication List    ASK your doctor about these medications        glyBURIDE-metformin 5-500 MG tablet  Commonly known as:  GLUCOVANCE  Take 1 tablet by mouth 2 (two) times daily as needed (for high blood sugar). Blood sugar > 150 then pt takes, if < 95  pt does not take       Current  facility-administered medications:  .  0.9 %  sodium chloride infusion, 10 mL/hr, Intravenous, Once, Leo Grosser, MD .  cephALEXin [facility]) capsule 500 mg, 500 mg, Oral, Q12H, Maryellen Pile, MD, 500 mg at 07/25/15 0852 .  insulin aspart (novoLOG) injection 0-9 Units, 0-9 Units, Subcutaneous, TID WC, Nischal Narendra, MD, 5 Units at 07/25/15 1235 .  oxyCODONE-acetaminophen (PERCOCET/ROXICET) 5-325 MG per tablet 1 tablet, 1 tablet, Oral, Q6H PRN, Collier Salina, MD, 1 tablet at 07/24/15 1422 .  pantoprazole (PROTONIX) EC tablet 40 mg, 40 mg, Oral, Daily, Maryellen Pile, MD, 40 mg at 07/25/15 1234 .  polyethylene glycol (MIRALAX / GLYCOLAX) packet 17 g, 17 g, Oral, Daily, Rushil Patel V, MD, 17 g at 07/25/15 0852 .  promethazine (PHENERGAN) tablet 12.5 mg, 12.5 mg, Oral, Q4H PRN, Iline Oven, MD, 12.5 mg at 07/24/15 1641 .  simethicone (MYLICON) chewable tablet 80 mg, 80 mg, Oral, TID PC & HS, Maryellen Pile, MD, 80 mg at 07/25/15 1234  Allergy:  Allergies  Allergen Reactions  . Penicillins Itching, Nausea And Vomiting and Rash    Has patient had a PCN reaction causing immediate rash, facial/tongue/throat swelling, SOB or lightheadedness with hypotension: Yes Has patient had a PCN reaction causing severe rash involving mucus membranes or skin necrosis: No Has patient had a PCN reaction that required hospitalization No Has patient had a PCN reaction occurring within the last 10 years: No If all of the above answers are "NO", then may proceed with Cephalosporin use.     Social Hx:   Social History   Social History  . Marital Status: Widowed    Spouse Name: N/A  . Number of Children: N/A  . Years of Education: N/A   Occupational History  . Not on file.   Social History Main Topics  . Smoking status: Never Smoker   . Smokeless tobacco: Not on file  . Alcohol Use: Not on file  . Drug Use: Not on file  . Sexual Activity: Not on file   Other Topics Concern  . Not on file    Social History Narrative  . No narrative on file    Past Surgical Hx: History reviewed. Exploratory laparotomy (no hysterectomy) for cervical cancer diagnosis in 2003  Past Medical Hx:  Past Medical History  Diagnosis Date  . Diabetes mellitus without complication ([place])    cervical cancer ("early stage") treated with chemoradiation  2003  Past Gynecological History:  Cervical cancer 2003 (chemoradiation)  No LMP recorded.  Family Hx: No family history on file. No gyn malignancies  Review of Systems:  Constitutional  Feels improved generally  ENT Normal appearing ears and nares bilaterally Skin/Breast  No rash, sores, jaundice, itching, dryness Cardiovascular  No chest pain, shortness of breath, or edema  Pulmonary  No cough or wheeze.  Gastro Intestinal  No nausea, vomitting, or diarrhoea. No bright red blood per rectum, no abdominal pain, change in bowel movement, or constipation.  Genito Urinary  No frequency, urgency, dysuria, no bleeding or discharge Musculo Skeletal  No myalgia, arthralgia, joint swelling or pain  Neurologic  No weakness, numbness, change in gait,  Psychology  No depression, anxiety, insomnia.   Vitals:  Blood pressure 144/62, pulse 85, temperature 98.3 F (36.8 C), temperature source Oral, resp. rate 16, height 5' (1.524 m), weight 98 lb 12.3 oz (44.8 kg), SpO2 96 %.  Physical Exam: WD in NAD Neck  Supple NROM,  without any enlargements.  Lymph Node Survey No cervical supraclavicular or inguinal adenopathy Cardiovascular  deferred Lungs  deferred Skin  No rash/lesions/breakdown  Psychiatry  Alert and oriented to person, place, and time  Abdomen  Normoactive bowel sounds, abdomen soft, non-tender and obese without evidence of hernia. tatoo mark from prior radiation. Low transverse incision from prior laparotomy Back No CVA tenderness Genito Urinary  Deferred (unable to perform satisfactory gyn exam in hospital bed) Rectal   deferred Extremities  No bilateral cyanosis, clubbing or edema.   Donaciano Eva, MD  07/25/2015, 2:20 PM  CC: Dr Daryll Drown, Dr Tresa Moore

## 2015-07-25 NOTE — Progress Notes (Signed)
Patient ID: Heidi [name], female   DOB: 1940/12/03, 75 y.o.   MRN: [id number]   Subjective: [name] reports some chest discomfort this morning. Reports hiccups with sharp chest pain in the middle of her chest. No radiation. Also reports some dyspepsia.   Objective: Vital signs in last 24 hours: Filed Vitals:   07/24/15 1801 07/24/15 2034 07/25/15 0520 07/25/15 0910  BP: 140/61 126/62 127/66 144/62  Pulse: 80 80 83 85  Temp: 98 F (36.7 C) 98.4 F (36.9 C) 97.5 F (36.4 C) 98.3 F (36.8 C)  TempSrc: Oral Oral Oral Oral  Resp: 18 18 16 16   Height:      Weight:      SpO2: 98% 98% 96% 96%   General: elderly, friendly [place] lady resting in bed comfortably Cardiac: regular rate and rhythm, no rubs, murmurs or gallops Pulm: breathing well, clear to auscultation bilaterally Abd: bowel sounds normal, soft, nondistended, non-tender, no CVA tenderness, bilateral nephrostomy tubes in place with non-bloody urine Ext: warm and well perfused, without pedal edema Neuro: alert and oriented X3  Lab Results: Basic Metabolic Panel:  Recent Labs Lab 07/24/15 0434 07/25/15 0455  NA 134* 134*  K 4.2 4.0  CL 106 104  CO2 19* 18*  GLUCOSE 111* 131*  BUN 37* 38*  CREATININE 2.58* 2.45*  CALCIUM 8.8* 8.8*   CBC:  Recent Labs Lab 07/20/15 1735  07/22/15 0539 07/23/15 0559  WBC 10.6*  < > 9.3 7.8  NEUTROABS 8.1*  --   --   --   HGB 5.5*  < > 9.5* 10.3*  HCT 17.2*  < > 28.2* 30.8*  MCV 93.0  < > 87.6 86.5  PLT 263  < > 225 266  < > = values in this interval not displayed. Anemia Panel:  Recent Labs Lab 07/20/15 1735 07/20/15 1736  VITAMINB12 421  --   FOLATE  --  23.0  FERRITIN 279  --   TIBC 225*  --   IRON 17*  --   RETICCTPCT 2.7  --    Studies/Results: Ir Nephrostomy Placement Left  07/23/2015  INDICATION: 75 year old with a pelvic mass that is suspicious for malignancy. Patient has bilateral hydronephrosis and renal insufficiency. Request for  bilateral nephrostomy tube placement. EXAM: PLACEMENT OF BILATERAL NEPHROSTOMY TUBES WITH ULTRASOUND AND FLUOROSCOPIC GUIDANCE COMPARISON:  None. MEDICATIONS: Ciprofloxacin 400 mg IV; The antibiotic was administered in an appropriate time frame prior to skin puncture. ANESTHESIA/SEDATION: Fentanyl 75 mcg IV; Versed 0.5 mg IV Moderate Sedation Time:  30 minutes The patient was continuously monitored during the procedure by the interventional radiology nurse under my direct supervision. CONTRAST:  20 mL - administered into the collecting system(s) FLUOROSCOPY TIME:  Fluoroscopy Time: 1 minutes 36 seconds COMPLICATIONS: None immediate. PROCEDURE: Informed written consent was obtained from the patient after a thorough discussion of the procedural risks, benefits and alternatives. All questions were addressed. Consent was obtained using a Patent attorney. Maximal Sterile Barrier Technique was utilized including caps, mask, sterile gowns, sterile gloves, sterile drape, hand hygiene and skin antiseptic. A timeout was performed prior to the initiation of the procedure. Patient was placed prone. Both flanks were prepped and draped in a sterile fashion. Ultrasound confirmed hydronephrosis in left kidney. Skin was anesthetized with 1% lidocaine. [place] needle was directed into a dilated mid pole calyx with ultrasound guidance. Contrast injection confirmed placement in the renal collecting system. A 0.018 wire was advanced into the renal pelvis. Accustick dilator set was placed.  The tract was dilated to accommodate a 10.2 [place] multipurpose drain. Drain was reconstituted in the renal pelvis. Small amount of contrast was injected to confirm placement in the renal pelvis. Catheter was attached to gravity bag and sutured to the skin. Attention was directed to the right kidney. Right flank was anesthetized with 1% lidocaine. Targeting of the right kidney was more difficult than the left due to close proximity of the  right hepatic lobe. A lower pole calyx was targeted for a posterior approach. Needle was directed into the calyx with ultrasound guidance. Wire was easily advanced into the renal pelvis. The tract was dilated and Accustick dilator placed. A 10.2 [place] multipurpose drain was reconstituted in the renal pelvis. Contrast injection confirmed placement in the renal pelvis. Right nephrostomy tube was sutured to the skin. A sample of urine was obtained from both nephrostomy tubes and sent for culture. Fluoroscopic and ultrasound images were taken and saved for documentation. FINDINGS: Moderate to severe hydronephrosis in both kidneys. Nephrostomy tubes were placed and reconstituted in the renal pelvis bilaterally. Mildly cloudy urine was identified from both nephrostomy tubes. IMPRESSION: Successful placement of bilateral nephrostomy tubes with ultrasound and fluoroscopic guidance. Electronically Signed   By: Markus Daft M.D.   On: 07/23/2015 17:41   Ir Nephrostomy Placement Right  07/23/2015  INDICATION: 75 year old with a pelvic mass that is suspicious for malignancy. Patient has bilateral hydronephrosis and renal insufficiency. Request for bilateral nephrostomy tube placement. EXAM: PLACEMENT OF BILATERAL NEPHROSTOMY TUBES WITH ULTRASOUND AND FLUOROSCOPIC GUIDANCE COMPARISON:  None. MEDICATIONS: Ciprofloxacin 400 mg IV; The antibiotic was administered in an appropriate time frame prior to skin puncture. ANESTHESIA/SEDATION: Fentanyl 75 mcg IV; Versed 0.5 mg IV Moderate Sedation Time:  30 minutes The patient was continuously monitored during the procedure by the interventional radiology nurse under my direct supervision. CONTRAST:  20 mL - administered into the collecting system(s) FLUOROSCOPY TIME:  Fluoroscopy Time: 1 minutes 36 seconds COMPLICATIONS: None immediate. PROCEDURE: Informed written consent was obtained from the patient after a thorough discussion of the procedural risks, benefits and alternatives. All  questions were addressed. Consent was obtained using a Patent attorney. Maximal Sterile Barrier Technique was utilized including caps, mask, sterile gowns, sterile gloves, sterile drape, hand hygiene and skin antiseptic. A timeout was performed prior to the initiation of the procedure. Patient was placed prone. Both flanks were prepped and draped in a sterile fashion. Ultrasound confirmed hydronephrosis in left kidney. Skin was anesthetized with 1% lidocaine. [place] needle was directed into a dilated mid pole calyx with ultrasound guidance. Contrast injection confirmed placement in the renal collecting system. A 0.018 wire was advanced into the renal pelvis. Accustick dilator set was placed. The tract was dilated to accommodate a 10.2 [place] multipurpose drain. Drain was reconstituted in the renal pelvis. Small amount of contrast was injected to confirm placement in the renal pelvis. Catheter was attached to gravity bag and sutured to the skin. Attention was directed to the right kidney. Right flank was anesthetized with 1% lidocaine. Targeting of the right kidney was more difficult than the left due to close proximity of the right hepatic lobe. A lower pole calyx was targeted for a posterior approach. Needle was directed into the calyx with ultrasound guidance. Wire was easily advanced into the renal pelvis. The tract was dilated and Accustick dilator placed. A 10.2 [place] multipurpose drain was reconstituted in the renal pelvis. Contrast injection confirmed placement in the renal pelvis. Right nephrostomy tube was sutured to the  skin. A sample of urine was obtained from both nephrostomy tubes and sent for culture. Fluoroscopic and ultrasound images were taken and saved for documentation. FINDINGS: Moderate to severe hydronephrosis in both kidneys. Nephrostomy tubes were placed and reconstituted in the renal pelvis bilaterally. Mildly cloudy urine was identified from both nephrostomy tubes. IMPRESSION:  Successful placement of bilateral nephrostomy tubes with ultrasound and fluoroscopic guidance. Electronically Signed   By: Markus Daft M.D.   On: 07/23/2015 17:41   Medications: I have reviewed the patient's current medications. Scheduled Meds: . sodium chloride  10 mL/hr Intravenous Once  . cephALEXin  500 mg Oral Q12H  . insulin aspart  0-9 Units Subcutaneous TID WC  . pantoprazole  40 mg Oral Daily  . polyethylene glycol  17 g Oral Daily  . simethicone  80 mg Oral TID PC & HS   Continuous Infusions:  PRN Meds:.  Assessment/Plan:  Sra. Heidi [name] is a friendly 75 year old [place] lady with type 2 diabetes presenting with a complicated urinary tract infection, normocytic anemia, and obstructive uropathy.  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 Needs Gyn-Onc follow up.  -Appreciate gyn-onc help, will need follow up at discharge  Renal insufficiency with bilateral hydronephrosis: Likely long standing likely 2/2 malignancy. Cr continues to trend down but unclear baseline. Had bilateral nephrostomy tubes placed 5/2. Non-bloody urine today. Pain well controlled.  -Appreciate urology consult, will need urology follow up at discharge. Urine cultures from b/l nephrostomy placement with no growth.  -Appreciate IR consult -Oxy 5 mg [id number] prn  Complicated urinary tract infection: BCx 1/2 tubes growing coag negative staph. Likely contaminant. Recultured 5/1, NGTD. UCx growing pansensitive Klebsiella.  Urine cultures from b/l nephrostomy placement with no growth.  -ceftriaxone 4/29 >>5/1, ancef 5/2 >> 5/2 -Continue keflex PO to finish 10 day abx course, stop date 5/9  Normocytic anemia: Her hemoglobin corrected appropriately from 6 to 9 after she received 3U PRBCs so I doubt hemolysis. She appears to be iron-deficient suggestive of chronic bleeding. She also seems to have had endometrial cancer in the past but denies any vaginal bleeding. Likely 2/2 underlying malignancy.     Dispo: Anticipate discharge tomorrow.  The patient does not have a current PCP (No Pcp Per Patient) and does need an [facility] hospital follow-up appointment after discharge.  The patient does have transportation limitations that hinder transportation to clinic appointments.  .Services Needed at time of discharge: Y = Yes, Blank = No PT:   OT:   RN:   Equipment:   Other:     LOS: 5 days   Maryellen Pile, MD 07/25/2015, 1:14 PM

## 2015-07-26 LAB — UIFE/LIGHT CHAINS/TP QN, 24-HR UR
% BETA, URINE: UNDETERMINED %
ALBUMIN, U: UNDETERMINED %
ALPHA 1 URINE: UNDETERMINED %
ALPHA 2 UR: UNDETERMINED %
FREE KAPPA/LAMBDA RATIO: 5.18 (ref 2.04–10.37)
FREE LAMBDA LT CHAINS, UR: 104 mg/L — AB (ref 0.24–6.66)
FREE LT CHN EXCR RATE: 539 mg/L — AB (ref 1.35–24.19)
GAMMA GLOBULIN URINE: UNDETERMINED %
IMMUNOFIXATION RESULT, URINE: UNDETERMINED
M-SPIKE %, Urine: UNDETERMINED %
TIME-UPE24: 24 h
Total Protein, Urine: 74.5 mg/dL
Volume, Urine: 2700 mL

## 2015-07-27 LAB — CULTURE, BLOOD (ROUTINE X 2)
Culture: NO GROWTH
Culture: NO GROWTH

## 2015-07-29 ENCOUNTER — Other Ambulatory Visit (HOSPITAL_BASED_OUTPATIENT_CLINIC_OR_DEPARTMENT_OTHER): Payer: Medicaid Other

## 2015-07-29 ENCOUNTER — Ambulatory Visit: Payer: Medicaid Other | Attending: Gynecologic Oncology | Admitting: Gynecologic Oncology

## 2015-07-29 ENCOUNTER — Other Ambulatory Visit (HOSPITAL_COMMUNITY)
Admission: RE | Admit: 2015-07-29 | Discharge: 2015-07-29 | Disposition: A | Discharge status: Home / Self Care | Payer: Medicaid Other | Source: Ambulatory Visit | Attending: Gynecologic Oncology | Admitting: Gynecologic Oncology

## 2015-07-29 ENCOUNTER — Encounter: Payer: Self-pay | Admitting: Gynecologic Oncology

## 2015-07-29 VITALS — BP 168/69 | HR 80 | Temp 98.4°F | Resp 18 | Ht 60.0 in | Wt 97.7 lb

## 2015-07-29 DIAGNOSIS — N1330 Unspecified hydronephrosis: Secondary | ICD-10-CM

## 2015-07-29 DIAGNOSIS — Z8541 Personal history of malignant neoplasm of cervix uteri: Secondary | ICD-10-CM | POA: Insufficient documentation

## 2015-07-29 DIAGNOSIS — C539 Malignant neoplasm of cervix uteri, unspecified: Secondary | ICD-10-CM | POA: Diagnosis not present

## 2015-07-29 DIAGNOSIS — N138 Other obstructive and reflux uropathy: Secondary | ICD-10-CM | POA: Diagnosis not present

## 2015-07-29 DIAGNOSIS — Z794 Long term (current) use of insulin: Secondary | ICD-10-CM | POA: Diagnosis not present

## 2015-07-29 DIAGNOSIS — Z880 Allergy status to penicillin: Secondary | ICD-10-CM | POA: Diagnosis not present

## 2015-07-29 DIAGNOSIS — N179 Acute kidney failure, unspecified: Secondary | ICD-10-CM | POA: Insufficient documentation

## 2015-07-29 DIAGNOSIS — N139 Obstructive and reflux uropathy, unspecified: Secondary | ICD-10-CM

## 2015-07-29 DIAGNOSIS — Z01411 Encounter for gynecological examination (general) (routine) with abnormal findings: Secondary | ICD-10-CM | POA: Diagnosis not present

## 2015-07-29 DIAGNOSIS — E119 Type 2 diabetes mellitus without complications: Secondary | ICD-10-CM | POA: Insufficient documentation

## 2015-07-29 LAB — BASIC METABOLIC PANEL
Anion Gap: 7 mEq/L (ref 3–11)
BUN: 39.8 mg/dL — ABNORMAL HIGH (ref 7.0–26.0)
CALCIUM: 9.4 mg/dL (ref 8.4–10.4)
CO2: 24 meq/L (ref 22–29)
Chloride: 101 mEq/L (ref 98–109)
Creatinine: 1.9 mg/dL — ABNORMAL HIGH (ref 0.6–1.1)
EGFR: 26 mL/min/{1.73_m2} — AB (ref >90)
GLUCOSE: 105 mg/dL (ref 70–140)
Potassium: 4.5 mEq/L (ref 3.5–5.1)
Sodium: 133 mEq/L — ABNORMAL LOW (ref 136–145)

## 2015-07-29 NOTE — Progress Notes (Signed)
CERVICAL CANCER FOLLOW-UP  Consult was initially requested by Dr. Daryll Drown for the evaluation of Heidi [name] 75 y.o. female  CC:  Chief Complaint  Patient presents with  .   bilateral hydronephrosis History of cervical cancer Soft tissue density in central pelvis on CT imaging AKI - obstructive nephropathy  Assessment/Plan:  [name] is a 75 y.o. 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  I reviewed her CT images from admission.  She has no gross recurrent disease on pelvic examination. She will require a PET scan to further work up the central pelvic region and soft tissue thickening on imaging. If positive on imaging, may need directed biopsies (I don't think an EUA and trucut bx would be likely to yield positive results).   An alternative diagnosis to recurrent cancer might be chronic retroperitoneal radiation-induced fibrosis causing distal ureteral obstruction. THis diagnosis is less likely given the abruptness of her presentation. If recurrence is ruled out, she may be considered for a diverting urinary procedure (eg conduit vs continent pouch).  We will see her back after she has had her PET scan for next steps and planning.  HPI: Heidi [name] is a 75 year old parous woman who is seen in consultation at the request of Dr Gilles Chiquito for pelvic tissue thickening on CT imaging, a history of cervical cancer and bilateral ureteral obstruction.  The patient was interviewed with her grand daughter who translated.  She reports a remote (2003) history of cervical cancer that was treated with primary chemoradiation at [facility] [place]. She had an ex lap as part of workup of her cancer, but hysterectomy was not performed. She had a complete response to primary therapy and did attend surveillance visits for many years and had no evidence of recurrence.  However, she developed  abdominal pain and symptoms of urosepsis in April, 2017 and was admitted to [facility] on 07/20/15. Upon admission her creatinine was noted to be very elevated (3.37). She had findings of Klebsiella pneumoniae bacteriuria and was treated with antibiotics. She underwent imaging of the abdomen and pelvis (non contrasted CT) which showed: Bilateral hydronephrosis, moderate-to-severe in degree, favored to be chronic given the lack of significant perinephric inflammation. Poorly defined soft tissue mass/thickening within the pelvis, surrounding the bladder and within the bilateral adnexal regions, difficult to definitively characterize without intravascular contrast. This poorly defined soft tissue mass/thickening is the likely source of the bilateral obstructive uropathy. This may represent local spread of neoplastic mass related to the given history of endometrial cancer. Alternatively, this could represent postsurgical and/or post-treatment changes (scarring/fibrosis). As above, there is soft tissue mass/thickening circumferentially about the bladder. This may represent bladder wall thickening or an adjacent soft tissue encasement. Surgical clips are noted along the bilateral pelvic sidewalls. Previous tumor resection?  She underwent bilateral percutaneous nephrostomy tube placement on 07/23/15 and received a consultation with Urologist, Dr Tresa Moore. Her creatinine trended down after placement of the percutaneous nephrostomy tubes.  The patient reports having no vaginal bleeding or abnormal discharge. She denies weight loss.  Current Meds:  No current facility-administered medications on file prior to encounter.   No current outpatient prescriptions on file prior to encounter.     Medication List    ASK your doctor about these medications       glyBURIDE-metformin 5-500 MG tablet  Commonly known as: GLUCOVANCE  Take 1 tablet by mouth 2 (two) times daily as needed (for high blood  sugar).  Blood sugar > 150 then pt takes, if < 95 pt does not take       Current facility-administered medications:  . 0.9 % sodium chloride infusion, 10 mL/hr, Intravenous, Once, Leo Grosser, MD . cephALEXin [facility]) capsule 500 mg, 500 mg, Oral, Q12H, Maryellen Pile, MD, 500 mg at 07/25/15 0852 . insulin aspart (novoLOG) injection 0-9 Units, 0-9 Units, Subcutaneous, TID WC, Nischal Narendra, MD, 5 Units at 07/25/15 1235 . oxyCODONE-acetaminophen (PERCOCET/ROXICET) 5-325 MG per tablet 1 tablet, 1 tablet, Oral, Q6H PRN, Collier Salina, MD, 1 tablet at 07/24/15 1422 . pantoprazole (PROTONIX) EC tablet 40 mg, 40 mg, Oral, Daily, Maryellen Pile, MD, 40 mg at 07/25/15 1234 . polyethylene glycol (MIRALAX / GLYCOLAX) packet 17 g, 17 g, Oral, Daily, Rushil Patel V, MD, 17 g at 07/25/15 0852 . promethazine (PHENERGAN) tablet 12.5 mg, 12.5 mg, Oral, Q4H PRN, Iline Oven, MD, 12.5 mg at 07/24/15 1641 . simethicone (MYLICON) chewable tablet 80 mg, 80 mg, Oral, TID PC & HS, Maryellen Pile, MD, 80 mg at 07/25/15 1234  Allergy:  Allergies  Allergen Reactions  . Penicillins Itching, Nausea And Vomiting and Rash    Has patient had a PCN reaction causing immediate rash, facial/tongue/throat swelling, SOB or lightheadedness with hypotension: Yes Has patient had a PCN reaction causing severe rash involving mucus membranes or skin necrosis: No Has patient had a PCN reaction that required hospitalization No Has patient had a PCN reaction occurring within the last 10 years: No If all of the above answers are "NO", then may proceed with Cephalosporin use.     Social Hx:  Social History   Social History  . Marital Status: Widowed    Spouse Name: N/A  . Number of Children: N/A  . Years of Education: N/A   Occupational History  . Not on file.   Social History Main Topics  . Smoking status: Never Smoker   . Smokeless tobacco: Not on file  .  Alcohol Use: Not on file  . Drug Use: Not on file  . Sexual Activity: Not on file   Other Topics Concern  . Not on file   Social History Narrative  . No narrative on file    Past Surgical Hx: History reviewed. Exploratory laparotomy (no hysterectomy) for cervical cancer diagnosis in 2003  Past Medical Hx:  Past Medical History  Diagnosis Date  . Diabetes mellitus without complication ([place])    cervical cancer ("early stage") treated with chemoradiation 2003  Past Gynecological History: Cervical cancer 2003 (chemoradiation) No LMP recorded.  Family Hx: No family history on file. No gyn malignancies  Review of Systems:  Constitutional  Feels improved generally  ENT Normal appearing ears and nares bilaterally Skin/Breast  No rash, sores, jaundice, itching, dryness Cardiovascular  No chest pain, shortness of breath, or edema  Pulmonary  No cough or wheeze.  Gastro Intestinal  No nausea, vomitting, or diarrhoea. No bright red blood per rectum, no abdominal pain, change in bowel movement, or constipation.  Genito Urinary  No frequency, urgency, dysuria, no bleeding or discharge Musculo Skeletal  No myalgia, arthralgia, joint swelling or pain  Neurologic  No weakness, numbness, change in gait,  Psychology  No depression, anxiety, insomnia.   Vitals: Blood pressure 144/62, pulse 85, temperature 98.3 F (36.8 C), temperature source Oral, resp. rate 16, height 5' (1.524 m), weight 98 lb 12.3 oz (44.8 kg), SpO2 96 %.  Physical Exam: WD in NAD Neck  Supple NROM, without any enlargements.  Lymph Node Survey No cervical supraclavicular or inguinal adenopathy Cardiovascular  deferred Lungs  deferred Skin  No rash/lesions/breakdown  Psychiatry  Alert and oriented to person, place, and time  Abdomen  Normoactive bowel sounds, abdomen soft, non-tender and obese without evidence of hernia. tatoo mark from  prior radiation. Low transverse incision from prior laparotomy Back No CVA tenderness Genito Urinary  Normal appearing external genitalia. Vagina is very shortened (1-2cm) and with radiation changes (atrophy, thin vaginal tissue, loss of rugation) but with no lesions. Pap taken. Rectal  No distinct mass appreciated in anterior pelvis. Tissues mobile. No nodularity. Extremities  No bilateral cyanosis, clubbing or edema.   Donaciano Eva, MD   CC: Dr Tresa Moore

## 2015-07-29 NOTE — Patient Instructions (Signed)
Plan to have a PET scan to evaluate for recurrent disease.  We will check your kidney function today with lab work.  Plan to follow up with Dr. Denman George after having the PET scan.  Please call for any questions or concerns.

## 2015-07-31 LAB — CYTOLOGY - PAP

## 2015-08-05 ENCOUNTER — Ambulatory Visit (HOSPITAL_COMMUNITY)
Admission: RE | Admit: 2015-08-05 | Discharge: 2015-08-05 | Disposition: A | Discharge status: Home / Self Care | Payer: Medicaid Other | Source: Ambulatory Visit | Attending: Gynecologic Oncology | Admitting: Gynecologic Oncology

## 2015-08-05 DIAGNOSIS — I2510 Atherosclerotic heart disease of native coronary artery without angina pectoris: Secondary | ICD-10-CM | POA: Insufficient documentation

## 2015-08-05 DIAGNOSIS — R590 Localized enlarged lymph nodes: Secondary | ICD-10-CM | POA: Insufficient documentation

## 2015-08-05 DIAGNOSIS — K8020 Calculus of gallbladder without cholecystitis without obstruction: Secondary | ICD-10-CM | POA: Insufficient documentation

## 2015-08-05 DIAGNOSIS — Z8541 Personal history of malignant neoplasm of cervix uteri: Secondary | ICD-10-CM | POA: Diagnosis not present

## 2015-08-05 DIAGNOSIS — N1330 Unspecified hydronephrosis: Secondary | ICD-10-CM | POA: Diagnosis not present

## 2015-08-05 LAB — GLUCOSE, CAPILLARY: GLUCOSE-CAPILLARY: 113 mg/dL — AB (ref 65–99)

## 2015-08-05 MED ORDER — FLUDEOXYGLUCOSE F - 18 (FDG) INJECTION
5.3500 | Freq: Once | INTRAVENOUS | Status: AC | PRN
  Administered 2015-08-05: 5.35 via INTRAVENOUS

## 2015-08-08 ENCOUNTER — Telehealth: Payer: Self-pay | Admitting: Gynecologic Oncology

## 2015-08-08 NOTE — Telephone Encounter (Signed)
Returned call to [phone].  They had called earlier on behalf of the patient since she speaks Spanish asking about PET scan results.  Advised of the results as well as the pap results.  Appt for Monday with Dr. Denman George.  Advised that Dr. Denman George would discuss the findings and her recommendations in more detail on Monday.  No concerns voiced.  Advised to call for any needs or concerns before that time.

## 2015-08-12 ENCOUNTER — Ambulatory Visit: Payer: Medicaid Other | Attending: Gynecologic Oncology | Admitting: Gynecologic Oncology

## 2015-08-12 ENCOUNTER — Encounter: Payer: Self-pay | Admitting: Gynecologic Oncology

## 2015-08-12 VITALS — BP 143/70 | HR 82 | Temp 98.5°F | Resp 18 | Ht 60.0 in | Wt 100.4 lb

## 2015-08-12 DIAGNOSIS — N1330 Unspecified hydronephrosis: Secondary | ICD-10-CM | POA: Diagnosis not present

## 2015-08-12 DIAGNOSIS — Z8541 Personal history of malignant neoplasm of cervix uteri: Secondary | ICD-10-CM

## 2015-08-12 DIAGNOSIS — Z08 Encounter for follow-up examination after completed treatment for malignant neoplasm: Secondary | ICD-10-CM | POA: Insufficient documentation

## 2015-08-12 DIAGNOSIS — N135 Crossing vessel and stricture of ureter without hydronephrosis: Secondary | ICD-10-CM | POA: Diagnosis not present

## 2015-08-12 DIAGNOSIS — Z923 Personal history of irradiation: Secondary | ICD-10-CM | POA: Diagnosis not present

## 2015-08-12 DIAGNOSIS — E119 Type 2 diabetes mellitus without complications: Secondary | ICD-10-CM | POA: Insufficient documentation

## 2015-08-12 DIAGNOSIS — R935 Abnormal findings on diagnostic imaging of other abdominal regions, including retroperitoneum: Secondary | ICD-10-CM | POA: Diagnosis not present

## 2015-08-12 DIAGNOSIS — Z880 Allergy status to penicillin: Secondary | ICD-10-CM | POA: Insufficient documentation

## 2015-08-12 DIAGNOSIS — N138 Other obstructive and reflux uropathy: Secondary | ICD-10-CM

## 2015-08-12 DIAGNOSIS — N179 Acute kidney failure, unspecified: Secondary | ICD-10-CM | POA: Insufficient documentation

## 2015-08-12 NOTE — Progress Notes (Signed)
Gynecologic Oncology Multi-Disciplinary Disposition Conference Note  Date of the Conference: Aug 12, 2015  Patient Name: Heidi [name]  Referring Provider: Seen inpt Primary GYN Oncologist: Dr. Everitt Amber  Stage/Disposition:  History of cervical cancer.  No evidence of disease on scan or examination.  Disposition is to continue workup with urology.  Consider repeat imaging in three months.   This Multidisciplinary conference took place involving physicians from [place], [place], Radiation Oncology, Pathology, Radiology along with the Gynecologic Oncology Nurse Practitioner and RN.  Comprehensive assessment of the patient's malignancy, staging, need for surgery, chemotherapy, radiation therapy, and need for further testing were reviewed. Supportive measures, both inpatient and following discharge were also discussed. The recommended plan of care is documented. Greater than 35 minutes were spent correlating and coordinating this patient's care.

## 2015-08-12 NOTE — Progress Notes (Signed)
CERVICAL CANCER FOLLOW-UP  Consult was initially requested by Dr. Daryll Drown for the evaluation of Heidi [name] 75 y.o. female  CC:        bilateral hydronephrosis History of cervical cancer Soft tissue density in central pelvis on CT imaging AKI - obstructive nephropathy  Assessment/Plan:  Heidi [name] is a 75 y.o. 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  We reviewed her PET images, pap results and clinical picture at our multidisciplinary conference.  Physical exam, pap and PET scan show no apparent central pelvic recurrence that could be attributing to her ureteral obstruction. Due to the findings of mildly avid nodes and small focus within the endometrial cavity, I recommend repeat PET in 3 months to document stability.  I suspect the obstruction is from radiation fibrosis. If there is no apparent recurrence of her cancer, she may be a candidate for a diverting procedure, however I will defer this to her treating urologists. She has an appointment with Dr Tresa Moore in June.  We will facilitate helping home health be set up for management of her perc neph tube dressings.  I will see her back in 3 months for repeat PET and exam to document stability of findings.  HPI: Heidi [name] is a 75 year old parous woman who is seen in consultation at the request of Dr Gilles Chiquito for pelvic tissue thickening on CT imaging, a history of cervical cancer and bilateral ureteral obstruction.  The patient was interviewed with her grand daughter who translated.  She reports a remote (2003) history of cervical cancer that was treated with primary chemoradiation at [facility] [place]. She had an ex lap as part of workup of her cancer, but hysterectomy was not performed. She had a complete response to primary therapy and did attend surveillance visits for many  years and had no evidence of recurrence.  However, she developed abdominal pain and symptoms of urosepsis in April, 2017 and was admitted to [facility] on 07/20/15. Upon admission her creatinine was noted to be very elevated (3.37). She had findings of Klebsiella pneumoniae bacteriuria and was treated with antibiotics. She underwent imaging of the abdomen and pelvis (non contrasted CT) which showed: Bilateral hydronephrosis, moderate-to-severe in degree, favored to be chronic given the lack of significant perinephric inflammation. Poorly defined soft tissue mass/thickening within the pelvis, surrounding the bladder and within the bilateral adnexal regions, difficult to definitively characterize without intravascular contrast. This poorly defined soft tissue mass/thickening is the likely source of the bilateral obstructive uropathy. This may represent local spread of neoplastic mass related to the given history of endometrial cancer. Alternatively, this could represent postsurgical and/or post-treatment changes (scarring/fibrosis). As above, there is soft tissue mass/thickening circumferentially about the bladder. This may represent bladder wall thickening or an adjacent soft tissue encasement. Surgical clips are noted along the bilateral pelvic sidewalls. Previous tumor resection?  She underwent bilateral percutaneous nephrostomy tube placement on 07/23/15 and received a consultation with Urologist, Dr Tresa Moore. Her creatinine trended down after placement of the percutaneous nephrostomy tubes.  The patient reports having no vaginal bleeding or abnormal discharge. She denies weight loss.  Interval Hx:  On May 8th a pap was taken that was unremarkable and her pelvic exam revealed a shortened vagina with no apparent pelvic masses.  On 08/05/15 PET/CT was performed and revealed: minimal hypermetabolism within left periaortic and right inguinal lymph nodes, nonspecific and possibly reactive. Attention on followup  exams is warranted. Mild vague hypermetabolism along the left vaginal cuff without a definite CT correlate. Findings may be treatment related..  Meds:  Current Outpatient Prescriptions on File Prior to Visit  Medication Sig Dispense Refill  . glyBURIDE-metformin (GLUCOVANCE) 5-500 MG tablet Take 1 tablet by mouth 2 (two) times daily as needed (for high blood sugar). Blood sugar > 150 then pt takes, if < 95 pt does not take    . oxyCODONE-acetaminophen (PERCOCET/ROXICET) 5-325 MG tablet Take 1 tablet by mouth every 6 (six) hours as needed for moderate pain or severe pain. 30 tablet 0  . pantoprazole (PROTONIX) 40 MG tablet Take 1 tablet (40 mg total) by mouth daily. (Patient not taking: Reported on 08/12/2015) 30 tablet 0  . simethicone (MYLICON) 80 MG chewable tablet Chew 1 tablet (80 mg total) by mouth 4 (four) times daily - after meals and at bedtime. (Patient not taking: Reported on 07/29/2015) 30 tablet 0   No current facility-administered medications on file prior to visit.     Allergy:  Allergies  Allergen Reactions  . Penicillins Itching, Nausea And Vomiting and Rash    Has patient had a PCN reaction causing immediate rash, facial/tongue/throat swelling, SOB or lightheadedness with hypotension: Yes Has patient had a PCN reaction causing severe rash involving mucus membranes or skin necrosis: No Has patient had a PCN reaction that required hospitalization No Has patient had a PCN reaction occurring within the last 10 years: No If all of the above answers are "NO", then may proceed with Cephalosporin use.     Social Hx:  Social History   Social History  . Marital Status: Widowed    Spouse Name: N/A  . Number of Children: N/A  . Years of Education: N/A   Occupational History  . Not on file.   Social History Main Topics  . Smoking status: Never Smoker   . Smokeless tobacco: Not on file  . Alcohol Use: Not on file  . Drug Use:  Not on file  . Sexual Activity: Not on file   Other Topics Concern  . Not on file   Social History Narrative  . No narrative on file    Past Surgical Hx: History reviewed. Exploratory laparotomy (no hysterectomy) for cervical cancer diagnosis in 2003  Past Medical Hx:  Past Medical History  Diagnosis Date  . Diabetes mellitus without complication ([place])    cervical cancer ("early stage") treated with chemoradiation 2003  Past Gynecological History: Cervical cancer 2003 (chemoradiation) No LMP recorded.  Family Hx: No family history on file. No gyn malignancies  Review of Systems:  Constitutional  Feels improved generally  ENT Normal appearing ears and nares bilaterally Skin/Breast  No rash, sores, jaundice, itching, dryness Cardiovascular  No chest pain, shortness of breath, or edema  Pulmonary  No cough or wheeze.  Gastro Intestinal  No nausea, vomitting, or diarrhoea. No bright red blood per rectum, no abdominal pain, change in bowel movement, or constipation.  Genito Urinary  No frequency, urgency, dysuria, no bleeding or discharge Musculo Skeletal  No myalgia, arthralgia, joint swelling or pain  Neurologic  No weakness, numbness, change in gait,  Psychology  No depression, anxiety, insomnia.   Vitals: Blood pressure 144/62, pulse 85, temperature 98.3 F (36.8 C), temperature source Oral, resp. rate 16, height 5' (1.524 m), weight 98 lb 12.3 oz (44.8 kg), SpO2 96 %.  Physical Exam: WD in NAD Neck  Supple NROM, without any enlargements.  Lymph Node Survey No cervical supraclavicular  or inguinal adenopathy Cardiovascular  deferred Lungs  deferred Skin  No rash/lesions/breakdown  Psychiatry  Alert and oriented to person, place, and time  Abdomen  Normoactive bowel sounds, abdomen soft, non-tender and obese without evidence of hernia. tatoo mark from prior radiation. Low transverse incision from  prior laparotomy Back No CVA tenderness Genito Urinary  Normal appearing external genitalia. Vagina is very shortened (1-2cm) and with radiation changes (atrophy, thin vaginal tissue, loss of rugation) but with no lesions. Pap taken. Rectal  No distinct mass appreciated in anterior pelvis. Tissues mobile. No nodularity. Extremities  No bilateral cyanosis, clubbing or edema.   Donaciano Eva, MD   CC: Dr Tresa Moore

## 2015-08-12 NOTE — Patient Instructions (Signed)
We scheduled you MRI for August 11 , 2017 and follow up with Dr Everitt Amber on November 06, 2015 to discuss the results . Please call [phone] with any changes , questions or concerns.  Thank you!

## 2015-09-12 ENCOUNTER — Other Ambulatory Visit: Payer: Self-pay | Admitting: Urology

## 2015-09-12 DIAGNOSIS — N131 Hydronephrosis with ureteral stricture, not elsewhere classified: Secondary | ICD-10-CM

## 2015-09-18 ENCOUNTER — Other Ambulatory Visit: Payer: Self-pay | Admitting: Radiology

## 2015-09-19 ENCOUNTER — Ambulatory Visit (HOSPITAL_COMMUNITY)
Admission: RE | Admit: 2015-09-19 | Discharge: 2015-09-19 | Disposition: A | Discharge status: Home / Self Care | Payer: Medicaid Other | Source: Ambulatory Visit | Attending: Urology | Admitting: Urology

## 2015-09-19 ENCOUNTER — Other Ambulatory Visit: Payer: Self-pay | Admitting: Urology

## 2015-09-19 DIAGNOSIS — Z436 Encounter for attention to other artificial openings of urinary tract: Secondary | ICD-10-CM | POA: Insufficient documentation

## 2015-09-19 DIAGNOSIS — N131 Hydronephrosis with ureteral stricture, not elsewhere classified: Secondary | ICD-10-CM

## 2015-09-19 DIAGNOSIS — Z8541 Personal history of malignant neoplasm of cervix uteri: Secondary | ICD-10-CM | POA: Insufficient documentation

## 2015-09-19 MED ORDER — LIDOCAINE HCL 1 % IJ SOLN
INTRAMUSCULAR | Status: AC
  Filled 2015-09-19: qty 20

## 2015-09-19 MED ORDER — IOPAMIDOL (ISOVUE-300) INJECTION 61%
50.0000 mL | Freq: Once | INTRAVENOUS | Status: AC | PRN
  Administered 2015-09-19: 15 mL

## 2015-09-19 MED ORDER — LIDOCAINE HCL 1 % IJ SOLN
INTRAMUSCULAR | Status: DC | PRN
  Administered 2015-09-19 (×2): 5 mL via INTRADERMAL

## 2015-09-19 NOTE — Procedures (Signed)
Successful bialteral PCN exchange.   No immediate complications.   Ronny Bacon, MD Pager #: [phone]

## 2015-10-11 ENCOUNTER — Encounter (HOSPITAL_COMMUNITY): Payer: Self-pay | Admitting: Emergency Medicine

## 2015-10-11 ENCOUNTER — Inpatient Hospital Stay (HOSPITAL_COMMUNITY)
Admission: EM | Admit: 2015-10-11 | Discharge: 2015-10-18 | DRG: 854 | Disposition: A | Discharge status: Home / Self Care | Payer: Medicaid Other | Attending: Internal Medicine | Admitting: Internal Medicine

## 2015-10-11 ENCOUNTER — Emergency Department (HOSPITAL_COMMUNITY): Payer: Medicaid Other

## 2015-10-11 ENCOUNTER — Inpatient Hospital Stay (HOSPITAL_COMMUNITY): Payer: Medicaid Other

## 2015-10-11 DIAGNOSIS — N939 Abnormal uterine and vaginal bleeding, unspecified: Secondary | ICD-10-CM

## 2015-10-11 DIAGNOSIS — B964 Proteus (mirabilis) (morganii) as the cause of diseases classified elsewhere: Secondary | ICD-10-CM | POA: Diagnosis present

## 2015-10-11 DIAGNOSIS — Z8542 Personal history of malignant neoplasm of other parts of uterus: Secondary | ICD-10-CM | POA: Diagnosis not present

## 2015-10-11 DIAGNOSIS — N821 Other female urinary-genital tract fistulae: Secondary | ICD-10-CM

## 2015-10-11 DIAGNOSIS — E1165 Type 2 diabetes mellitus with hyperglycemia: Secondary | ICD-10-CM | POA: Diagnosis present

## 2015-10-11 DIAGNOSIS — E11649 Type 2 diabetes mellitus with hypoglycemia without coma: Secondary | ICD-10-CM | POA: Diagnosis present

## 2015-10-11 DIAGNOSIS — E860 Dehydration: Secondary | ICD-10-CM | POA: Diagnosis present

## 2015-10-11 DIAGNOSIS — N135 Crossing vessel and stricture of ureter without hydronephrosis: Secondary | ICD-10-CM | POA: Diagnosis present

## 2015-10-11 DIAGNOSIS — Z923 Personal history of irradiation: Secondary | ICD-10-CM

## 2015-10-11 DIAGNOSIS — E1122 Type 2 diabetes mellitus with diabetic chronic kidney disease: Secondary | ICD-10-CM | POA: Diagnosis present

## 2015-10-11 DIAGNOSIS — A419 Sepsis, unspecified organism: Secondary | ICD-10-CM | POA: Diagnosis present

## 2015-10-11 DIAGNOSIS — N329 Bladder disorder, unspecified: Secondary | ICD-10-CM | POA: Diagnosis present

## 2015-10-11 DIAGNOSIS — E440 Moderate protein-calorie malnutrition: Secondary | ICD-10-CM | POA: Diagnosis present

## 2015-10-11 DIAGNOSIS — N179 Acute kidney failure, unspecified: Secondary | ICD-10-CM | POA: Diagnosis present

## 2015-10-11 DIAGNOSIS — A4159 Other Gram-negative sepsis: Secondary | ICD-10-CM | POA: Diagnosis present

## 2015-10-11 DIAGNOSIS — D649 Anemia, unspecified: Secondary | ICD-10-CM | POA: Diagnosis present

## 2015-10-11 DIAGNOSIS — N139 Obstructive and reflux uropathy, unspecified: Secondary | ICD-10-CM

## 2015-10-11 DIAGNOSIS — N390 Urinary tract infection, site not specified: Secondary | ICD-10-CM | POA: Diagnosis present

## 2015-10-11 DIAGNOSIS — N3941 Urge incontinence: Secondary | ICD-10-CM | POA: Diagnosis present

## 2015-10-11 DIAGNOSIS — E119 Type 2 diabetes mellitus without complications: Secondary | ICD-10-CM

## 2015-10-11 DIAGNOSIS — N1330 Unspecified hydronephrosis: Secondary | ICD-10-CM | POA: Diagnosis present

## 2015-10-11 DIAGNOSIS — R590 Localized enlarged lymph nodes: Secondary | ICD-10-CM | POA: Diagnosis present

## 2015-10-11 DIAGNOSIS — N820 Vesicovaginal fistula: Secondary | ICD-10-CM | POA: Diagnosis present

## 2015-10-11 DIAGNOSIS — E8809 Other disorders of plasma-protein metabolism, not elsewhere classified: Secondary | ICD-10-CM

## 2015-10-11 DIAGNOSIS — N183 Chronic kidney disease, stage 3 unspecified: Secondary | ICD-10-CM

## 2015-10-11 DIAGNOSIS — Z8541 Personal history of malignant neoplasm of cervix uteri: Secondary | ICD-10-CM

## 2015-10-11 DIAGNOSIS — Z79899 Other long term (current) drug therapy: Secondary | ICD-10-CM | POA: Diagnosis not present

## 2015-10-11 DIAGNOSIS — N289 Disorder of kidney and ureter, unspecified: Secondary | ICD-10-CM

## 2015-10-11 DIAGNOSIS — Z880 Allergy status to penicillin: Secondary | ICD-10-CM

## 2015-10-11 DIAGNOSIS — E871 Hypo-osmolality and hyponatremia: Secondary | ICD-10-CM | POA: Diagnosis present

## 2015-10-11 DIAGNOSIS — E118 Type 2 diabetes mellitus with unspecified complications: Secondary | ICD-10-CM

## 2015-10-11 DIAGNOSIS — R739 Hyperglycemia, unspecified: Secondary | ICD-10-CM

## 2015-10-11 DIAGNOSIS — Z9221 Personal history of antineoplastic chemotherapy: Secondary | ICD-10-CM | POA: Diagnosis not present

## 2015-10-11 LAB — COMPREHENSIVE METABOLIC PANEL
ALK PHOS: 121 U/L (ref 38–126)
ALT: 19 U/L (ref 14–54)
ANION GAP: 8 (ref 5–15)
AST: 20 U/L (ref 15–41)
Albumin: 2.3 g/dL — ABNORMAL LOW (ref 3.5–5.0)
BUN: 33 mg/dL — ABNORMAL HIGH (ref 6–20)
CALCIUM: 8.6 mg/dL — AB (ref 8.9–10.3)
CHLORIDE: 102 mmol/L (ref 101–111)
CO2: 19 mmol/L — AB (ref 22–32)
Creatinine, Ser: 2.11 mg/dL — ABNORMAL HIGH (ref 0.44–1.00)
GFR calc non Af Amer: 22 mL/min — ABNORMAL LOW (ref >60)
GFR, EST AFRICAN AMERICAN: 25 mL/min — AB (ref >60)
Glucose, Bld: 323 mg/dL — ABNORMAL HIGH (ref 65–99)
Potassium: 4.8 mmol/L (ref 3.5–5.1)
SODIUM: 129 mmol/L — AB (ref 135–145)
Total Bilirubin: 0.3 mg/dL (ref 0.3–1.2)
Total Protein: 7.1 g/dL (ref 6.5–8.1)

## 2015-10-11 LAB — PROTIME-INR
INR: 1.43 (ref 0.00–1.49)
Prothrombin Time: 17.5 seconds — ABNORMAL HIGH (ref 11.6–15.2)

## 2015-10-11 LAB — URINALYSIS, ROUTINE W REFLEX MICROSCOPIC
Bilirubin Urine: NEGATIVE
Glucose, UA: 100 mg/dL — AB
KETONES UR: NEGATIVE mg/dL
NITRITE: NEGATIVE
PH: 6.5 (ref 5.0–8.0)
Protein, ur: >300 mg/dL — AB
Specific Gravity, Urine: 1.017 (ref 1.005–1.030)

## 2015-10-11 LAB — CBC
HEMATOCRIT: 25.9 % — AB (ref 36.0–46.0)
HEMOGLOBIN: 8.4 g/dL — AB (ref 12.0–15.0)
MCH: 30.2 pg (ref 26.0–34.0)
MCHC: 32.4 g/dL (ref 30.0–36.0)
MCV: 93.2 fL (ref 78.0–100.0)
PLATELETS: 294 10*3/uL (ref 150–400)
RBC: 2.78 MIL/uL — AB (ref 3.87–5.11)
RDW: 14.6 % (ref 11.5–15.5)
WBC: 19.2 10*3/uL — AB (ref 4.0–10.5)

## 2015-10-11 LAB — GLUCOSE, CAPILLARY: Glucose-Capillary: 160 mg/dL — ABNORMAL HIGH (ref 65–99)

## 2015-10-11 LAB — URINE MICROSCOPIC-ADD ON: SQUAMOUS EPITHELIAL / LPF: NONE SEEN

## 2015-10-11 LAB — POC OCCULT BLOOD, ED: FECAL OCCULT BLD: NEGATIVE

## 2015-10-11 LAB — APTT: aPTT: 37 s (ref 24–37)

## 2015-10-11 LAB — I-STAT CG4 LACTIC ACID, ED
LACTIC ACID, VENOUS: 1.61 mmol/L (ref 0.5–1.9)
Lactic Acid, Venous: 0.63 mmol/L (ref 0.5–1.9)

## 2015-10-11 MED ORDER — HYDROCODONE-ACETAMINOPHEN 5-325 MG PO TABS
1.0000 | ORAL_TABLET | ORAL | Status: DC | PRN
  Administered 2015-10-12 – 2015-10-13 (×3): 1 via ORAL
  Filled 2015-10-11 (×4): qty 1

## 2015-10-11 MED ORDER — ENSURE ENLIVE PO LIQD: 237.0000 mL | Freq: Two times a day (BID) | ORAL | Status: DC

## 2015-10-11 MED ORDER — SODIUM CHLORIDE 0.9 % IV SOLN
INTRAVENOUS | Status: DC
  Administered 2015-10-12 – 2015-10-13 (×3): 1000 mL via INTRAVENOUS
  Administered 2015-10-14: 100 mL/h via INTRAVENOUS
  Administered 2015-10-14 – 2015-10-17 (×6): via INTRAVENOUS

## 2015-10-11 MED ORDER — GLUCERNA SHAKE PO LIQD
237.0000 mL | Freq: Three times a day (TID) | ORAL | Status: DC
  Administered 2015-10-12 (×2): 237 mL via ORAL

## 2015-10-11 MED ORDER — ONDANSETRON HCL 4 MG/2ML IJ SOLN: 4.0000 mg | Freq: Four times a day (QID) | INTRAMUSCULAR | Status: DC | PRN

## 2015-10-11 MED ORDER — INSULIN ASPART 100 UNIT/ML ~~LOC~~ SOLN
0.0000 [IU] | Freq: Three times a day (TID) | SUBCUTANEOUS | Status: DC
  Administered 2015-10-12: 1 [IU] via SUBCUTANEOUS
  Administered 2015-10-13: 5 [IU] via SUBCUTANEOUS
  Administered 2015-10-14 – 2015-10-16 (×7): 2 [IU] via SUBCUTANEOUS
  Administered 2015-10-16: 1 [IU] via SUBCUTANEOUS
  Administered 2015-10-17: 2 [IU] via SUBCUTANEOUS
  Administered 2015-10-18: 1 [IU] via SUBCUTANEOUS
  Administered 2015-10-18: 2 [IU] via SUBCUTANEOUS

## 2015-10-11 MED ORDER — PANTOPRAZOLE SODIUM 40 MG PO TBEC
40.0000 mg | DELAYED_RELEASE_TABLET | Freq: Every day | ORAL | Status: DC
  Administered 2015-10-12 – 2015-10-18 (×7): 40 mg via ORAL
  Filled 2015-10-11 (×7): qty 1

## 2015-10-11 MED ORDER — DEXTROSE 5 % IV SOLN
2.0000 g | INTRAVENOUS | Status: DC
  Administered 2015-10-12 (×2): 2 g via INTRAVENOUS
  Filled 2015-10-11 (×4): qty 2

## 2015-10-11 MED ORDER — ONDANSETRON HCL 4 MG PO TABS: 4.0000 mg | ORAL_TABLET | Freq: Four times a day (QID) | ORAL | Status: DC | PRN

## 2015-10-11 MED ORDER — SODIUM CHLORIDE 0.9 % IV BOLUS (SEPSIS)
500.0000 mL | Freq: Once | INTRAVENOUS | Status: AC
  Administered 2015-10-11: 500 mL via INTRAVENOUS

## 2015-10-11 MED ORDER — DEXTROSE 5 % IV SOLN: 1.0000 g | Freq: Two times a day (BID) | INTRAVENOUS | Status: DC

## 2015-10-11 MED ORDER — INSULIN DETEMIR 100 UNIT/ML ~~LOC~~ SOLN
5.0000 [IU] | Freq: Every day | SUBCUTANEOUS | Status: DC
  Administered 2015-10-11 – 2015-10-12 (×2): 5 [IU] via SUBCUTANEOUS
  Filled 2015-10-11 (×3): qty 0.05

## 2015-10-11 MED ORDER — SODIUM CHLORIDE 0.9 % IV BOLUS (SEPSIS)
1000.0000 mL | Freq: Once | INTRAVENOUS | Status: AC
  Administered 2015-10-11: 1000 mL via INTRAVENOUS

## 2015-10-11 MED ORDER — ALBUTEROL SULFATE (2.5 MG/3ML) 0.083% IN NEBU: 2.5000 mg | INHALATION_SOLUTION | RESPIRATORY_TRACT | Status: DC | PRN

## 2015-10-11 MED ORDER — ACETAMINOPHEN 650 MG RE SUPP: 650.0000 mg | Freq: Four times a day (QID) | RECTAL | Status: DC | PRN

## 2015-10-11 MED ORDER — CEFTRIAXONE SODIUM 1 G IJ SOLR
1.0000 g | INTRAMUSCULAR | Status: DC
  Administered 2015-10-11: 1 g via INTRAVENOUS
  Filled 2015-10-11: qty 10

## 2015-10-11 MED ORDER — SODIUM CHLORIDE 0.9 % IV SOLN: Freq: Once | INTRAVENOUS | Status: DC

## 2015-10-11 MED ORDER — ACETAMINOPHEN 325 MG PO TABS
650.0000 mg | ORAL_TABLET | Freq: Four times a day (QID) | ORAL | Status: DC | PRN
  Administered 2015-10-11: 650 mg via ORAL
  Filled 2015-10-11: qty 2

## 2015-10-11 MED ORDER — DEXTROSE 5 % IV SOLN: 2.0000 g | Freq: Once | INTRAVENOUS | Status: DC

## 2015-10-11 NOTE — ED Notes (Signed)
Hurts in her bottom and to void since 6/22, had a pelvic then and she states that they hurt her

## 2015-10-11 NOTE — Progress Notes (Signed)
Called and received report from [place].  Patient  Speaks no English, grandaughter at bedside to translate.

## 2015-10-11 NOTE — ED Provider Notes (Signed)
CSN: [id number]     Arrival date & time 10/11/15  1408 History   First MD Initiated Contact with Patient 10/11/15 1522     Chief Complaint  Patient presents with  . Dysuria     (Consider location/radiation/quality/duration/timing/severity/associated sxs/prior Treatment) HPI  75 year old female with diabetes, history of uterine cancer, status post bilateral nephrostomy tube placement secondary to urinary retention thought due to retroperitoneal fibrosis who presents today complaining of pain in pelvic area, difficulty voiding, and fever that began yesterday. History is obtained through granddaughter and daughter who are caregivers. They state that patient has been complaining of pain since she was last seen by her doctor June 22. She is complaining of pain in the perineal area. She has been having difficulty voiding and stooling. They noted a fever yesterday. She denies headache, chest pain, cough, nausea, or vomiting. She also states that she has had difficulty stooling but had a stool yesterday. She denies any evidence of blood in stool or dark tarry stools. Nephrostomy sites have been clean and she has not complained of any back pain. They have not noted any discolored urine in bag.  Past Medical History  Diagnosis Date  . Diabetes mellitus without complication ([place])   . Uterine cancer ([place])    History reviewed. No pertinent past surgical history. No family history on file. Social History  Substance Use Topics  . Smoking status: Never Smoker   . Smokeless tobacco: None  . Alcohol Use: No   OB History    No data available     Review of Systems  All other systems reviewed and are negative.     Allergies  Penicillins  Home Medications   Prior to Admission medications   Medication Sig Start Date End Date Taking? Authorizing Provider  glyBURIDE-metformin (GLUCOVANCE) 5-500 MG tablet Take 1 tablet by mouth 2 (two) times daily as needed (for high blood sugar). Blood sugar > 150  then pt takes, if < 95 pt does not take    Historical Provider, MD  oxyCODONE-acetaminophen (PERCOCET/ROXICET) 5-325 MG tablet Take 1 tablet by mouth every 6 (six) hours as needed for moderate pain or severe pain. 07/25/15   Maryellen Pile, MD  pantoprazole (PROTONIX) 40 MG tablet Take 1 tablet (40 mg total) by mouth daily. Patient not taking: Reported on 08/12/2015 07/25/15   Maryellen Pile, MD  simethicone (MYLICON) 80 MG chewable tablet Chew 1 tablet (80 mg total) by mouth 4 (four) times daily - after meals and at bedtime. Patient not taking: Reported on 07/29/2015 07/25/15   Maryellen Pile, MD   BP 137/63 mmHg  Pulse 102  Temp(Src) 98.5 F (36.9 C) (Oral)  Resp 20  SpO2 95% Physical Exam  Constitutional: She is oriented to person, place, and time. She appears well-developed and well-nourished.  HENT:  Head: Normocephalic and atraumatic.  Right Ear: External ear normal.  Left Ear: External ear normal.  Nose: Nose normal.  Mouth/Throat: Oropharynx is clear and moist.  Eyes: Conjunctivae are normal. Pupils are equal, round, and reactive to light.  Neck: Normal range of motion. Neck supple.  Cardiovascular: Tachycardia present.   Pulmonary/Chest: Effort normal and breath sounds normal.  Abdominal: Soft. Bowel sounds are normal.  Musculoskeletal: Normal range of motion.  Nephrostomy bilateral flanks  Neurological: She is alert and oriented to person, place, and time.  Skin: Skin is warm and dry.  Psychiatric: She has a normal mood and affect.  Nursing note and vitals reviewed.   ED Course  Procedures (including  critical care time) Labs Review Labs Reviewed  CBC - Abnormal; Notable for the following:    WBC 19.2 (*)    RBC 2.78 (*)    Hemoglobin 8.4 (*)    HCT 25.9 (*)    All other components within normal limits  COMPREHENSIVE METABOLIC PANEL - Abnormal; Notable for the following:    Sodium 129 (*)    CO2 19 (*)    Glucose, Bld 323 (*)    BUN 33 (*)    Creatinine, Ser 2.11 (*)     Calcium 8.6 (*)    Albumin 2.3 (*)    GFR calc non Af Amer 22 (*)    GFR calc Af Amer 25 (*)    All other components within normal limits  URINE CULTURE  CULTURE, BLOOD (ROUTINE X 2)  CULTURE, BLOOD (ROUTINE X 2)  URINALYSIS, ROUTINE W REFLEX MICROSCOPIC (NOT AT [facility])  I-STAT CG4 LACTIC ACID, ED  POC OCCULT BLOOD, ED    Imaging Review Dg Chest 2 View  10/11/2015  CLINICAL DATA:  Fever. EXAM: CHEST  2 VIEW COMPARISON:  PET of 08/05/2015. FINDINGS: No prior plain film. Lateral view degraded by patient arm position. Artifact degraded lateral view posteriorly. Mild right hemidiaphragm elevation. Midline trachea. Normal heart size. Atherosclerosis in the transverse aorta. No pleural effusion or pneumothorax. Clear lungs. IMPRESSION: No acute process or explanation for fever. Aortic atherosclerosis. Electronically Signed   By: Abigail Miyamoto M.D.   On: 10/11/2015 17:59   I have personally reviewed and evaluated these images and lab results as part of my medical decision-making.   EKG Interpretation   Date/Time:  Friday October 11 2015 15:45:45 EDT Ventricular Rate:  101 PR Interval:    QRS Duration: 91 QT Interval:  337 QTC Calculation: 437 R Axis:   21 Text Interpretation:  Sinus tachycardia Low voltage, extremity leads  Confirmed by Sheyanne Munley MD, Andee Poles [id number]) on 10/11/2015 6:04:39 PM      MDM   Final diagnoses:  Complicated UTI (urinary tract infection)  Hyponatremia  Hyperglycemia    75 y.o. Female with h.o. Obstructive urinary retention s/p bilateral nephrostomy tubes presents today with fever, pelvic discomfort and urinary frequency here with cath urine significant for tntc wbc, wbc 19.     1- uti- iv abx given. Lactic acid normal.  Blood cultures sent, urine culture pending. Patient with nephrostomy tube in place- urology being consulted regarding need for line change. Discussed with Dr. Junious Silk. He notes that tubes were recently changed out 6/29. He feels that if patient  responds rapidly to antibiotics then we may be able to avoid repeat nephrostomy tube exchange. 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  ? Mild volume depletion.  Will need to have nephrostomy tubes assessed.    Discussed with Dr. Tamala Julian and plan med surg bed  Pattricia Boss, MD 10/11/15 1958

## 2015-10-11 NOTE — H&P (Addendum)
History and Physical    Heidi [name] DOB: 01-25-1941 DOA: 10/11/2015  Referring MD/NP/PA:  Dr. Jeanell Sparrow  PCP: No PCP Per Patient  Patient coming from: home  Chief Complaint: Lower abdominal pain  HPI: Heidi [name] is a 75 y.o. 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 History is obtained with use of the Spanish interpreter phone and with the assistant of patient's granddaughter is also present at bedside. The patient started complaining of pain since her last doctor's visit on 6/22, where her nephrostomy tubes were reported to be changed out. Complains of a strong burning pain in the suprapubic and lower abdomen. Pain worsen with urinating and defecating. Associated symptoms include fatigue, generalized weakness, fever up to 101F today, reported intermittent bleeding when wiping after urinating, decreased po intake, weight loss of 6-7 pounds over the last month, and can only makes a few drops of urine from her bladder when she does urinate since the nephrostomy tubes were first placed back in April 2017. Denies having any gross blood in stool or urine, nausea, vomiting, diarrhea, chest pain, shortness of breath. She states that her nephrostomy sites appear clean and are draining yellow urine.  Patient reports being followed by Dr. Tammi Klippel of urology. Also followed by Dr. Denman George of OB/GYN for cervical cancer history. Patient's last office visit was on 5/22, and review of records shows that imaging studies showed mildly avid nodes and small focus within the endometrial cavity for which repeat PET scan imaging was recommended in 3 months.  ED Course: Upon admission patient was evaluated and seen to have a fever up to 100.30F, pulse  up to 109, respirations up to  21, blood pressure and oxygen saturations are seen within normal limits. Laboratory results revealed WBC 19.2, hemoglobin 8.4, platelets 294, sodium 129, potassium 4.8, chloride 102, CO2 19, BUN 33, creatinine 2.11, calcium 8.6, glucose 323, and lactic acid 1.61. UA was positive for many bacteria, arge leukocytes, negative nitrites 6-30 RBCs, and TNTC WBCs.  Repeat CT scan shows nephrostomy tubes in proper position with increased moderate to severe bilateral hydronephrosis, diffuse bladder wall thickening, and New fluid in the endometrial canal which extends through a defect in the anterior uterine wall and is suspicious for vesicouterine fistula. The ED physician called Dr. Junious Silk of [facility], but was not formally consulted.Will Monitor with IV antibiotics for now. [facility] called to admit.   Review of Systems: As per HPI otherwise 10 point review of systems negative.   Past Medical History  Diagnosis Date  . Diabetes mellitus without complication ([place])   . Uterine cancer ([place])     History reviewed. No pertinent past surgical history.   reports that she has never smoked. She does not have any smokeless tobacco history on file. She reports that she does not drink alcohol or use illicit drugs.  Allergies  Allergen Reactions  . Penicillins Itching, Nausea And Vomiting and Rash    Has patient had a PCN reaction causing immediate rash, facial/tongue/throat swelling, SOB or lightheadedness with hypotension: Yes Has patient had a PCN reaction causing severe rash involving mucus membranes or skin necrosis: No Has patient had a PCN reaction that required hospitalization No Has patient had a PCN reaction occurring within the last 10 years: No If all of the above answers are "NO", then may proceed with Cephalosporin  use.     No family history on file.  Prior to Admission medications   Medication Sig Start Date End Date Taking? Authorizing Provider   acetaminophen (TYLENOL) 500 MG tablet Take 500 mg by mouth every 6 (six) hours as needed for mild pain.   Yes Historical Provider, MD  glyBURIDE-metformin (GLUCOVANCE) 5-500 MG tablet Take 1 tablet by mouth 2 (two) times daily as needed (for high blood sugar). Blood sugar > 150 then pt takes, if < 95 pt does not take   Yes Historical Provider, MD  oxyCODONE-acetaminophen (PERCOCET/ROXICET) 5-325 MG tablet Take 1 tablet by mouth every 6 (six) hours as needed for moderate pain or severe pain. 07/25/15  Yes Maryellen Pile, MD  pantoprazole (PROTONIX) 40 MG tablet Take 1 tablet (40 mg total) by mouth daily. Patient not taking: Reported on 08/12/2015 07/25/15   Maryellen Pile, MD  simethicone (MYLICON) 80 MG chewable tablet Chew 1 tablet (80 mg total) by mouth 4 (four) times daily - after meals and at bedtime. Patient not taking: Reported on 07/29/2015 07/25/15   Maryellen Pile, MD    Physical Exam:    Constitutional: Elderly Spanish speaking female who appears acutely sick, but nontoxic at this time. Filed Vitals:   10/11/15 1645 10/11/15 1700 10/11/15 1715 10/11/15 1846  BP: 117/52 152/66 116/85 154/65  Pulse: 94 99 101 102  Temp:    100.2 F (37.9 C)  TempSrc:    Oral  Resp: 21 15 18 16   SpO2: 100% 100% 100% 100%   Eyes: PERRL, lids and conjunctivae normal ENMT: Mucous membranes are dry. Posterior pharynx clear of any exudate or lesions.Normal dentition.  Neck: normal, supple, no masses, no thyromegaly Respiratory: clear to auscultation bilaterally, no wheezing, no crackles. Normal respiratory effort. No accessory muscle use.  Cardiovascular: tachycardic, no murmurs / rubs / gallops. No extremity edema. 2+ pedal pulses. No carotid bruits.  Abdomen: TTP of suprapubically, no masses palpated. No hepatosplenomegaly. Bowel sounds positive. B/L nephrostomy tubs draining yellow urine. Musculoskeletal: no clubbing / cyanosis. No joint deformity upper and lower extremities. Good ROM, no contractures.  Normal muscle tone.  Skin: no rashes, lesions, ulcers. No induration. No erythema surrounding nephrostomy tubes.  Neurologic: CN 2-12 grossly intact. Sensation intact, DTR normal. Strength 4/5 in all 4.  Psychiatric: Normal judgment and insight. Alert and oriented x 3. [name] Normal mood.     Labs on Admission: I have personally reviewed following labs and imaging studies  CBC:  Recent Labs Lab 10/11/15 1425  WBC 19.2*  HGB 8.4*  HCT 25.9*  MCV 93.2  PLT [id number]   Basic Metabolic Panel:  Recent Labs Lab 10/11/15 1425  NA 129*  K 4.8  CL 102  CO2 19*  GLUCOSE 323*  BUN 33*  CREATININE 2.11*  CALCIUM 8.6*   GFR: CrCl cannot be calculated (Unknown ideal weight.). Liver Function Tests:  Recent Labs Lab 10/11/15 1425  AST 20  ALT 19  ALKPHOS 121  BILITOT 0.3  PROT 7.1  ALBUMIN 2.3*   No results for input(s): LIPASE, AMYLASE in the last 168 hours. No results for input(s): AMMONIA in the last 168 hours. Coagulation Profile: No results for input(s): INR, PROTIME in the last 168 hours. Cardiac Enzymes: No results for input(s): CKTOTAL, CKMB, CKMBINDEX, TROPONINI in the last 168 hours. BNP (last 3 results) No results for input(s): PROBNP in the last 8760 hours. HbA1C: No results for input(s): HGBA1C in the last 72 hours. CBG: No results for input(s): GLUCAP  in the last 168 hours. Lipid Profile: No results for input(s): CHOL, HDL, LDLCALC, TRIG, CHOLHDL, LDLDIRECT in the last 72 hours. Thyroid Function Tests: No results for input(s): TSH, T4TOTAL, FREET4, T3FREE, THYROIDAB in the last 72 hours. Anemia Panel: No results for input(s): VITAMINB12, FOLATE, FERRITIN, TIBC, IRON, RETICCTPCT in the last 72 hours. Urine analysis:    Component Value Date/Time   COLORURINE YELLOW 10/11/2015 1616   APPEARANCEUR TURBID* 10/11/2015 1616   LABSPEC 1.017 10/11/2015 1616   PHURINE 6.5 10/11/2015 1616   GLUCOSEU 100* 10/11/2015 1616   HGBUR LARGE* 10/11/2015  1616   BILIRUBINUR NEGATIVE 10/11/2015 1616   KETONESUR NEGATIVE 10/11/2015 1616   PROTEINUR >300* 10/11/2015 1616   NITRITE NEGATIVE 10/11/2015 1616   LEUKOCYTESUR LARGE* 10/11/2015 1616   Sepsis Labs: No results found for this or any previous visit (from the past 240 hour(s)).   Radiological Exams on Admission: Ct Abdomen Pelvis Wo Contrast  10/11/2015  CLINICAL DATA:  Fever. Pelvic pain. History of cervical cancer. Bilateral hydronephrosis. EXAM: CT ABDOMEN AND PELVIS WITHOUT CONTRAST TECHNIQUE: Multidetector CT imaging of the abdomen and pelvis was performed following the standard protocol without IV contrast. COMPARISON:  PET-CT 08/05/2015.  CT abdomen and pelvis 07/21/2015. FINDINGS: Minimal atelectasis is present in the lung bases. There is no pleural effusion. A punctate calcification in the right hepatic lobe is unchanged. The liver is enlarged, measuring 21 cm in length. The multiple gallstones are again seen. There is no evidence of significant biliary dilatation. The spleen, adrenal glands, and pancreas have an unremarkable unenhanced appearance. Bilateral nephrostomy tubes remain in place. There is moderate to severe bilateral hydronephrosis which has increased from the prior PET-CT. Both ureters are also mildly to moderately dilated diffusely. There is no evidence of bowel obstruction. Diffuse aortoiliac atherosclerosis is noted. Subcentimeter para-aortic lymph nodes are similar to the prior PET-CT. A 1.5 cm right inguinal lymph node is incompletely visualized but has enlarged (series 201, image 84, previously 1.1 cm). Adjacent subcentimeter right inguinal lymph nodes are also slightly larger. Surgical clips are again noted bilaterally in the pelvis. The bladder is mildly distended with moderate diffuse bladder wall thickening. There is new distension of the endometrial canal by low-density fluid which appears to extend anteriorly through a defect in the uterine wall and may communicate  with the posterior bladder. No extraluminal fluid collection/abscess is identified in the pelvis. Soft tissue thickening is again seen in the pelvis likely reflecting the sequelae of prior radiation therapy. No free fluid is identified. No suspicious lytic blastic osseous lesions are identified. Grade 1 anterolisthesis of L4 on L5 is unchanged. IMPRESSION: 1. Increased, moderate to severe bilateral hydronephrosis with nephrostomy tubes in place. 2. Diffuse bladder wall thickening which may reflect urinary tract infection or be radiation induced. 3. New fluid in the endometrial canal which extends through a defect in the anterior uterine wall and is suspicious for vesicouterine fistula. 4. New right inguinal lymphadenopathy which may be reactive in the setting of infection. Electronically Signed   By: Logan Bores M.D.   On: 10/11/2015 18:54   Dg Chest 2 View  10/11/2015  CLINICAL DATA:  Fever. EXAM: CHEST  2 VIEW COMPARISON:  PET of 08/05/2015. FINDINGS: No prior plain film. Lateral view degraded by patient arm position. Artifact degraded lateral view posteriorly. Mild right hemidiaphragm elevation. Midline trachea. Normal heart size. Atherosclerosis in the transverse aorta. No pleural effusion or pneumothorax. Clear lungs. IMPRESSION: No acute process or explanation for fever. Aortic atherosclerosis. Electronically Signed  By: Abigail Miyamoto M.D.   On: 10/11/2015 17:59    EKG: Independently reviewed. Sinus tachycardia   Assessment/Plan Sepsis secondary to urinary tract infection: Acute. Patient reports lower abdominal pain and difficulty urinating and defecating. Patient with fever up to 101F, WBC of 19.4, tachycardia, tachypnea, and UA positive for signs of a UTI. 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 Most likely following patient's history of uterine cancer and radiation. Nephrostomy tubes reported to be last changed on 6/22. - Would consider consult IR  in a.m. for need of replacement of nephrostomy tubes in setting of acute infection  - ostomy care consult  Question vesicoureteral fistula/ Question Urethral vs. Vaginal bleeding: CT found possible fistula formation, right inguinal lymphadenopathy. Family also gives report of intermittent bleeding when wiping self after urinating that could be coming from the bladder, or ureters but would want to make sure. Especially in the setting of recent weight loss and previous CT in 06/2015 noting mildly avid nodes and small focus within the endometrial cavity. Although certain findings could be secondary to the infection as seen above. Patient has been seen by Dr. Denman George of OB/GYN and previously by Dr. Tresa Moore of Urology.  - Transvaginal U/S to evaluate the endometrium  - Likely warrant formal consultation to patient's OB/GYN and/or urology in am  - Pelvic exam will need to likely be done  Renal Insufficiency: Chronic. Creatinine on admission 2.11 with a BUN of 33; which appears overall similar to last value obtained in March of 1.9. Elevated BUN could suggest some signs of dehydration in collaboration with history of decreased po intake. - IV fluids - Recheck BMP in a.m.  Hyperglycemia with Diabetes mellitus type 2: Acute. Glucose elevated at 323 on admission likely secondary to underlying infection. Patient currently on only oral medications at this time. Patient's last hemoglobin A1c is unknown at this time. - Hypoglycemic protocol - Held glyburide-metformin  - Started Levemir 5  units subcutaneous at bedtime. Adjust as needed - CBGs every before meals with sensitive sliding scale insulin for now   Anemia: Hemoglobin previously between 9 and 10, but on presentation 8.4. The patient notes the vaginal bleeding with wiping after urination. Patient also found to have blood on urinalysis. Question exact source of this time. - T&S for possible need of blood products - Transfuse PRBC as needed for blood counts less than 7 and/or patient becomes symptomatic - check CBC in a.m.  Hyponatremia: Sodium 129 admission. Patient still hyponatremic even after correction for hyperglycemia. - Repeat BMP in a.m.  Hypoalbuminemia: Albumin 2.3 on admission.  - Check pre-albumin in a.m. - Glucerna shakes with meals  GERD prophylaxis: Protonix DVT prophylaxis: SCD will need to add on Lovenox if no surgical procedure planned Code Status:  Full Family Communication: Discussed overall plan with the patient and granddaughter at bedside.  Disposition Plan: Possible discharge home for outpatient follow-up in 2-3 days Consults called: None Admission status: inpatient telemetry  Norval Morton MD [facility] Pager [phone]  If 7PM-7AM, please contact night-coverage [email or web address] Password [facility]  10/11/2015, 7:18 PM

## 2015-10-11 NOTE — ED Notes (Signed)
Attempted report x1. 

## 2015-10-11 NOTE — ED Notes (Signed)
Pt attempted to use bathroom, but was unable to void

## 2015-10-12 LAB — BASIC METABOLIC PANEL
Anion gap: 5 (ref 5–15)
BUN: 29 mg/dL — AB (ref 6–20)
CALCIUM: 8.5 mg/dL — AB (ref 8.9–10.3)
CO2: 19 mmol/L — ABNORMAL LOW (ref 22–32)
CREATININE: 2.32 mg/dL — AB (ref 0.44–1.00)
Chloride: 111 mmol/L (ref 101–111)
GFR calc Af Amer: 22 mL/min — ABNORMAL LOW (ref >60)
GFR, EST NON AFRICAN AMERICAN: 19 mL/min — AB (ref >60)
GLUCOSE: 126 mg/dL — AB (ref 65–99)
Potassium: 4.1 mmol/L (ref 3.5–5.1)
SODIUM: 135 mmol/L (ref 135–145)

## 2015-10-12 LAB — CBC
HCT: 24 % — ABNORMAL LOW (ref 36.0–46.0)
Hemoglobin: 8 g/dL — ABNORMAL LOW (ref 12.0–15.0)
MCH: 30.8 pg (ref 26.0–34.0)
MCHC: 33.3 g/dL (ref 30.0–36.0)
MCV: 92.3 fL (ref 78.0–100.0)
PLATELETS: 257 10*3/uL (ref 150–400)
RBC: 2.6 MIL/uL — ABNORMAL LOW (ref 3.87–5.11)
RDW: 14.6 % (ref 11.5–15.5)
WBC: 19.7 10*3/uL — ABNORMAL HIGH (ref 4.0–10.5)

## 2015-10-12 LAB — GLUCOSE, CAPILLARY
GLUCOSE-CAPILLARY: 113 mg/dL — AB (ref 65–99)
GLUCOSE-CAPILLARY: 120 mg/dL — AB (ref 65–99)
Glucose-Capillary: 105 mg/dL — ABNORMAL HIGH (ref 65–99)
Glucose-Capillary: 151 mg/dL — ABNORMAL HIGH (ref 65–99)
Glucose-Capillary: 145 mg/dL — ABNORMAL HIGH (ref 65–99)

## 2015-10-12 LAB — PREALBUMIN: PREALBUMIN: 7.8 mg/dL — AB (ref 18–38)

## 2015-10-12 MED ORDER — GLUCERNA SHAKE PO LIQD
237.0000 mL | Freq: Two times a day (BID) | ORAL | Status: DC
  Administered 2015-10-13 – 2015-10-18 (×10): 237 mL via ORAL

## 2015-10-12 NOTE — Progress Notes (Addendum)
Patient primarily Heidi [name] speaking daughter at the bedside. Called the interpreter line [phone] for admission history and orient patient to the room and unit procedures. I

## 2015-10-12 NOTE — Progress Notes (Addendum)
PROGRESS NOTE    Heidi [name]  [id number] DOB: [birth year] DOA: 10/11/2015 PCP: No PCP Per Patient   Brief Narrative: Heidi [name] is a 75 y.o. 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 Patient reports lower abdominal pain and difficulty urinating and defecating. Patient with fever up to 101F, WBC of 19.4, tachycardia, tachypnea, and UA positive for signs of a UTI. 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 Most likely following patient's history of uterine cancer and radiation. Nephrostomy tubes reported to be last changed on 6/22. - ostomy care consult  Question vesicoureteral fistula/ Question Urethral vs. Vaginal bleeding: CT found possible fistula formation, right inguinal lymphadenopathy. Family also gives report of intermittent bleeding when wiping self after urinating that could be coming from the bladder, or ureters but would want to make sure. Especially in the setting of recent weight loss and previous CT in 06/2015 noting mildly avid nodes and small focus within the endometrial cavity. Although  certain findings could be secondary to the infection as seen above. Patient has been seen by Dr. Denman George of OB/GYN and previously by Dr. Tresa Moore of Urology.  - Transvaginal U/S to evaluate the endometrium showed mass vs hematoma  -urology consulted.  -will also consult GYN.   AKI; on chronic stage iv  Creatinine on admission 2.11 with a BUN of 33; which appears overall similar to last value obtained in March of 1.9. Elevated BUN could suggest some signs of dehydration in collaboration with history of decreased po intake. - IV fluids   Hyperglycemia with Diabetes mellitus type 2: Acute. Glucose elevated at 323 on admission likely secondary to underlying infection. Patient currently on only oral medications at this time. Patient's last hemoglobin A1c is unknown at this time. - Hypoglycemic protocol - Held glyburide-metformin  - Started Levemir 5 units subcutaneous at bedtime. Adjust as needed - likely need to discontinue metformin at discharge due to renal; failure.   Anemia: Hemoglobin previously between 9 and 10, but on presentation 8.4. The patient notes the vaginal bleeding with wiping after urination. Patient also found to have blood on urinalysis. Question exact source of this time. - T&S for possible need of blood products - Transfuse PRBC as needed for blood counts less than 7 and/or patient becomes symptomatic - hb stable, monitor.   Hyponatremia: Sodium 129 admission. Patient still hyponatremic even after correction for hyperglycemia. - improved with IV fluids.   Hypoalbuminemia: Albumin 2.3 on admission.  - Check pre-albumin 7.8 - Glucerna shakes with meals   DVT prophylaxis: scd.  Code Status: full code. Discussed with patient Family Communication: daughter at bedside.  Disposition Plan: remain inpatient for IV antibiotics.    Consultants:   Urology    Procedures:   none  Antimicrobials:   cefta 7-22  Subjective: Complaining of supra-pubic pain,  Bladder and  vagina.  Would like something for pain  Report hematuria.   Objective: Filed Vitals:   10/11/15 2153 10/11/15 2154 10/12/15 0206 10/12/15 0553  BP:  146/53  129/48  Pulse:  106  98  Temp:  101.9 F (38.8 C) 98.5 F (36.9 C) 97.7 F (36.5 C)  TempSrc:  Oral  Oral  Resp:  22  18  Height: 5\' 2"  (1.575 m)     Weight: 46.811 kg (103 lb 3.2 oz)     SpO2:  99%  100%    Intake/Output Summary (Last 24 hours) at 10/12/15 0917 Last data filed at 10/12/15 0803  Gross per 24 hour  Intake      0 ml  Output   1225 ml  Net  -1225 ml   Filed Weights   10/11/15 2153  Weight: 46.811 kg (103 lb 3.2 oz)    Examination:  General exam: Appears calm and comfortable  Respiratory system: Clear to auscultation. Respiratory effort normal. Cardiovascular system: S1 & S2 heard, RRR. No JVD, murmurs, rubs, gallops or clicks. No pedal edema. Gastrointestinal system: Abdomen is nondistended, soft and nontender. No organomegaly or masses felt. Normal bowel sounds heard. Supra-pubic tenderness, nephrostomy draining clear urine  Central nervous system: Alert and oriented. No focal neurological deficits. Extremities: Symmetric 5 x 5 power. Skin: No rashes, lesions or ulcers Psychiatry: Judgement and insight appear normal. Mood & affect appropriate.     Data Reviewed: I have personally reviewed following labs and imaging studies  CBC:  Recent Labs Lab 10/11/15 1425 10/12/15 0407  WBC 19.2* 19.7*  HGB 8.4* 8.0*  HCT 25.9* 24.0*  MCV 93.2 92.3  PLT 294 [id number]   Basic Metabolic Panel:  Recent Labs Lab 10/11/15 1425 10/12/15 0407  NA 129* 135  K 4.8 4.1  CL 102 111  CO2 19* 19*  GLUCOSE 323* 126*  BUN 33* 29*  CREATININE 2.11* 2.32*  CALCIUM 8.6* 8.5*   GFR: Estimated Creatinine Clearance: 15.2 mL/min (by C-G formula based on Cr of 2.32). Liver Function Tests:  Recent Labs Lab 10/11/15 1425  AST 20  ALT 19  ALKPHOS 121  BILITOT 0.3  PROT 7.1  ALBUMIN 2.3*   No results for  input(s): LIPASE, AMYLASE in the last 168 hours. No results for input(s): AMMONIA in the last 168 hours. Coagulation Profile:  Recent Labs Lab 10/11/15 2212  INR 1.43   Cardiac Enzymes: No results for input(s): CKTOTAL, CKMB, CKMBINDEX, TROPONINI in the last 168 hours. BNP (last 3 results) No results for input(s): PROBNP in the last 8760 hours. HbA1C: No results for input(s): HGBA1C in the last 72 hours. CBG:  Recent Labs Lab 10/11/15 2156 10/12/15 0427 10/12/15 0746  GLUCAP 160* 113* 105*   Lipid Profile: No results for input(s): CHOL, HDL, LDLCALC, TRIG, CHOLHDL, LDLDIRECT in the last 72 hours. Thyroid Function Tests: No results for input(s): TSH, T4TOTAL, FREET4, T3FREE, THYROIDAB in the last 72 hours. Anemia Panel: No results for input(s): VITAMINB12, FOLATE, FERRITIN, TIBC, IRON, RETICCTPCT in the last 72 hours. Sepsis Labs:  Recent Labs Lab 10/11/15 1602 10/11/15 2046  LATICACIDVEN 1.61 0.63    No results found for this or any previous visit (from the past 240 hour(s)).       Radiology Studies: Ct Abdomen Pelvis Wo Contrast  10/11/2015  CLINICAL DATA:  Fever. Pelvic pain. History of cervical cancer. Bilateral hydronephrosis. EXAM: CT ABDOMEN AND PELVIS WITHOUT CONTRAST TECHNIQUE: Multidetector CT imaging of  the abdomen and pelvis was performed following the standard protocol without IV contrast. COMPARISON:  PET-CT 08/05/2015.  CT abdomen and pelvis 07/21/2015. FINDINGS: Minimal atelectasis is present in the lung bases. There is no pleural effusion. A punctate calcification in the right hepatic lobe is unchanged. The liver is enlarged, measuring 21 cm in length. The multiple gallstones are again seen. There is no evidence of significant biliary dilatation. The spleen, adrenal glands, and pancreas have an unremarkable unenhanced appearance. Bilateral nephrostomy tubes remain in place. There is moderate to severe bilateral hydronephrosis which has increased from the  prior PET-CT. Both ureters are also mildly to moderately dilated diffusely. There is no evidence of bowel obstruction. Diffuse aortoiliac atherosclerosis is noted. Subcentimeter para-aortic lymph nodes are similar to the prior PET-CT. A 1.5 cm right inguinal lymph node is incompletely visualized but has enlarged (series 201, image 84, previously 1.1 cm). Adjacent subcentimeter right inguinal lymph nodes are also slightly larger. Surgical clips are again noted bilaterally in the pelvis. The bladder is mildly distended with moderate diffuse bladder wall thickening. There is new distension of the endometrial canal by low-density fluid which appears to extend anteriorly through a defect in the uterine wall and may communicate with the posterior bladder. No extraluminal fluid collection/abscess is identified in the pelvis. Soft tissue thickening is again seen in the pelvis likely reflecting the sequelae of prior radiation therapy. No free fluid is identified. No suspicious lytic blastic osseous lesions are identified. Grade 1 anterolisthesis of L4 on L5 is unchanged. IMPRESSION: 1. Increased, moderate to severe bilateral hydronephrosis with nephrostomy tubes in place. 2. Diffuse bladder wall thickening which may reflect urinary tract infection or be radiation induced. 3. New fluid in the endometrial canal which extends through a defect in the anterior uterine wall and is suspicious for vesicouterine fistula. 4. New right inguinal lymphadenopathy which may be reactive in the setting of infection. Electronically Signed   By: Logan Bores M.D.   On: 10/11/2015 18:54   Dg Chest 2 View  10/11/2015  CLINICAL DATA:  Fever. EXAM: CHEST  2 VIEW COMPARISON:  PET of 08/05/2015. FINDINGS: No prior plain film. Lateral view degraded by patient arm position. Artifact degraded lateral view posteriorly. Mild right hemidiaphragm elevation. Midline trachea. Normal heart size. Atherosclerosis in the transverse aorta. No pleural effusion  or pneumothorax. Clear lungs. IMPRESSION: No acute process or explanation for fever. Aortic atherosclerosis. Electronically Signed   By: Abigail Miyamoto M.D.   On: 10/11/2015 17:59   US Transvaginal Non-ob  10/12/2015  CLINICAL DATA:  Vaginal bleeding. History of cervical cancer status post radiation therapy. EXAM: TRANSABDOMINAL AND TRANSVAGINAL ULTRASOUND OF PELVIS TECHNIQUE: Both transabdominal and transvaginal ultrasound examinations of the pelvis were performed. Transabdominal technique was performed for global imaging of the pelvis including uterus, ovaries, adnexal regions, and pelvic cul-de-sac. It was necessary to proceed with endovaginal exam following the transabdominal exam to visualize the endometrium. COMPARISON:  CT abdomen and pelvis earlier today FINDINGS: Uterus Poorly visualized and only seen transabdominally. Due to patient pain and guarding, the uterus could not be visualized transvaginally and the patient could not tolerate any additional imaging attempts. The uterus measures approximately 9.1 cm in length and 4.1 cm in AP thickness. Endometrium Unable to be evaluated. Right ovary Not visualized. Left ovary Not visualized. Other findings The bladder is diffusely thick-walled as seen on earlier CT and contains complex fluid throughout. There is masslike soft tissue in the posterior aspect of the bladder without internal vascularity demonstrated on  color Doppler imaging. IMPRESSION: 1. Limited examination as above with poor visualization of the uterus. 2. Thick-walled bladder containing complex fluid as well as masslike soft tissue posteriorly. This is concerning for neoplasm although hematoma could potentially give a similar appearance. Cystoscopy may be helpful for further assessment. Electronically Signed   By: Logan Bores M.D.   On: 10/12/2015 00:37   US Pelvis Complete  10/12/2015  CLINICAL DATA:  Vaginal bleeding. History of cervical cancer status post radiation therapy. EXAM:  TRANSABDOMINAL AND TRANSVAGINAL ULTRASOUND OF PELVIS TECHNIQUE: Both transabdominal and transvaginal ultrasound examinations of the pelvis were performed. Transabdominal technique was performed for global imaging of the pelvis including uterus, ovaries, adnexal regions, and pelvic cul-de-sac. It was necessary to proceed with endovaginal exam following the transabdominal exam to visualize the endometrium. COMPARISON:  CT abdomen and pelvis earlier today FINDINGS: Uterus Poorly visualized and only seen transabdominally. Due to patient pain and guarding, the uterus could not be visualized transvaginally and the patient could not tolerate any additional imaging attempts. The uterus measures approximately 9.1 cm in length and 4.1 cm in AP thickness. Endometrium Unable to be evaluated. Right ovary Not visualized. Left ovary Not visualized. Other findings The bladder is diffusely thick-walled as seen on earlier CT and contains complex fluid throughout. There is masslike soft tissue in the posterior aspect of the bladder without internal vascularity demonstrated on color Doppler imaging. IMPRESSION: 1. Limited examination as above with poor visualization of the uterus. 2. Thick-walled bladder containing complex fluid as well as masslike soft tissue posteriorly. This is concerning for neoplasm although hematoma could potentially give a similar appearance. Cystoscopy may be helpful for further assessment. Electronically Signed   By: Logan Bores M.D.   On: 10/12/2015 00:37        Scheduled Meds: . cefTAZidime (FORTAZ)  IV  2 g Intravenous Q24H  . feeding supplement (ENSURE ENLIVE)  237 mL Oral BID BM  . feeding supplement (GLUCERNA SHAKE)  237 mL Oral TID BM  . insulin aspart  0-9 Units Subcutaneous TID WC  . insulin detemir  5 Units Subcutaneous QHS  . pantoprazole  40 mg Oral Daily   Continuous Infusions: . sodium chloride 1,000 mL (10/12/15 0140)     LOS: 1 day    Time spent: 35 minutes.      Elmarie Shiley, MD [facility] Pager [phone]  If 7PM-7AM, please contact night-coverage [email or web address] Password [facility] 10/12/2015, 9:17 AM

## 2015-10-12 NOTE — Progress Notes (Signed)
Chief Complaint: Patient was seen in consultation today for bilateral hydronephrosis at the request of Dr. Alexis Frock  Referring Physician(s): Dr. Alexis Frock  Supervising Physician: Jacqulynn Cadet  Patient Status: Inpatient  History of Present Illness: Heidi [name] is a 75 y.o. female admitted with urosepsis. Hx of cervical cancer with prior placement of (B)PCN initially on 5/2, exchanged 6/29 Per dtr, have been working well. Admission CT shows bilateral hydro despite PCNs in position. IR is asked to eval tubes to ensure proper function. No c/o pain at either site. Daughter at bedside  Past Medical History  Diagnosis Date  . Diabetes mellitus without complication ([place])   . Uterine cancer ([place])     History reviewed. No pertinent past surgical history.  Allergies: Penicillins  Medications:  Current facility-administered medications:  .  0.9 %  sodium chloride infusion, , Intravenous, Continuous, Belkys A Regalado, MD, Last Rate: 100 mL/hr at 10/12/15 1000, 100 mL/hr at 10/12/15 1000 .  acetaminophen (TYLENOL) tablet 650 mg, 650 mg, Oral, Q6H PRN, 650 mg at 10/11/15 2327 **OR** acetaminophen (TYLENOL) suppository 650 mg, 650 mg, Rectal, Q6H PRN, Norval Morton, MD .  albuterol (PROVENTIL) (2.5 MG/3ML) 0.083% nebulizer solution 2.5 mg, 2.5 mg, Nebulization, Q2H PRN, Norval Morton, MD .  cefTAZidime (FORTAZ) 2 g in dextrose 5 % 50 mL IVPB, 2 g, Intravenous, Q24H, Rondell A Tamala Julian, MD, 2 g at 10/12/15 0140 .  feeding supplement (GLUCERNA SHAKE) (GLUCERNA SHAKE) liquid 237 mL, 237 mL, Oral, TID BM, Rondell A Smith, MD, 237 mL at 10/12/15 0140 .  HYDROcodone-acetaminophen (NORCO/VICODIN) 5-325 MG per tablet 1 tablet, 1 tablet, Oral, Q4H PRN, Norval Morton, MD, 1 tablet at 10/12/15 0959 .  insulin aspart (novoLOG) injection 0-9 Units, 0-9 Units, Subcutaneous, TID WC, Norval Morton, MD, 0 Units at 10/12/15 0800 .  insulin detemir (LEVEMIR) injection  5 Units, 5 Units, Subcutaneous, QHS, Norval Morton, MD, 5 Units at 10/11/15 2313 .  ondansetron (ZOFRAN) tablet 4 mg, 4 mg, Oral, Q6H PRN **OR** ondansetron (ZOFRAN) injection 4 mg, 4 mg, Intravenous, Q6H PRN, Norval Morton, MD .  pantoprazole (PROTONIX) EC tablet 40 mg, 40 mg, Oral, Daily, Norval Morton, MD, 40 mg at 10/12/15 0603    No family history on file.  Social History   Social History  . Marital Status: Widowed    Spouse Name: N/A  . Number of Children: N/A  . Years of Education: N/A   Social History Main Topics  . Smoking status: Never Smoker   . Smokeless tobacco: None  . Alcohol Use: No  . Drug Use: No  . Sexual Activity: No   Other Topics Concern  . None   Social History Narrative     Review of Systems: A 12 point ROS discussed and pertinent positives are indicated in the HPI above.  All other systems are negative.  Review of Systems  Vital Signs: BP 129/48 mmHg  Pulse 98  Temp(Src) 97.7 F (36.5 C) (Oral)  Resp 18  Ht 5\' 2"  (1.575 m)  Wt 103 lb 3.2 oz (46.811 kg)  BMI 18.87 kg/m2  SpO2 100%  Physical Exam A&O x 3, no c/o Abd soft, ND, NT (L)PCN intact, site clean, no kinks. Bag is essentially full of clear urine, 47mL emptied. Tube flushed easily, no resistance, return of clear urine. (R)PCN intact, site clean, no kinks/leak. Bag has about 32mL clear urine, RN reprts 211mL emptied earlier this am.  Tube flushed easily,  no resistance, return of clear uriine.   Imaging: Ct Abdomen Pelvis Wo Contrast  10/11/2015  CLINICAL DATA:  Fever. Pelvic pain. History of cervical cancer. Bilateral hydronephrosis. EXAM: CT ABDOMEN AND PELVIS WITHOUT CONTRAST TECHNIQUE: Multidetector CT imaging of the abdomen and pelvis was performed following the standard protocol without IV contrast. COMPARISON:  PET-CT 08/05/2015.  CT abdomen and pelvis 07/21/2015. FINDINGS: Minimal atelectasis is present in the lung bases. There is no pleural effusion. A punctate  calcification in the right hepatic lobe is unchanged. The liver is enlarged, measuring 21 cm in length. The multiple gallstones are again seen. There is no evidence of significant biliary dilatation. The spleen, adrenal glands, and pancreas have an unremarkable unenhanced appearance. Bilateral nephrostomy tubes remain in place. There is moderate to severe bilateral hydronephrosis which has increased from the prior PET-CT. Both ureters are also mildly to moderately dilated diffusely. There is no evidence of bowel obstruction. Diffuse aortoiliac atherosclerosis is noted. Subcentimeter para-aortic lymph nodes are similar to the prior PET-CT. A 1.5 cm right inguinal lymph node is incompletely visualized but has enlarged (series 201, image 84, previously 1.1 cm). Adjacent subcentimeter right inguinal lymph nodes are also slightly larger. Surgical clips are again noted bilaterally in the pelvis. The bladder is mildly distended with moderate diffuse bladder wall thickening. There is new distension of the endometrial canal by low-density fluid which appears to extend anteriorly through a defect in the uterine wall and may communicate with the posterior bladder. No extraluminal fluid collection/abscess is identified in the pelvis. Soft tissue thickening is again seen in the pelvis likely reflecting the sequelae of prior radiation therapy. No free fluid is identified. No suspicious lytic blastic osseous lesions are identified. Grade 1 anterolisthesis of L4 on L5 is unchanged. IMPRESSION: 1. Increased, moderate to severe bilateral hydronephrosis with nephrostomy tubes in place. 2. Diffuse bladder wall thickening which may reflect urinary tract infection or be radiation induced. 3. New fluid in the endometrial canal which extends through a defect in the anterior uterine wall and is suspicious for vesicouterine fistula. 4. New right inguinal lymphadenopathy which may be reactive in the setting of infection. Electronically Signed    By: Logan Bores M.D.   On: 10/11/2015 18:54     Labs:  CBC:  Recent Labs  07/22/15 0539 07/23/15 0559 10/11/15 1425 10/12/15 0407  WBC 9.3 7.8 19.2* 19.7*  HGB 9.5* 10.3* 8.4* 8.0*  HCT 28.2* 30.8* 25.9* 24.0*  PLT 225 266 294 257    COAGS:  Recent Labs  07/20/15 1841 10/11/15 2212  INR 1.34 1.43  APTT  --  37    BMP:  Recent Labs  07/24/15 0434 07/25/15 0455 07/29/15 1624 10/11/15 1425 10/12/15 0407  NA 134* 134* 133* 129* 135  K 4.2 4.0 4.5 4.8 4.1  CL 106 104  --  102 111  CO2 19* 18* 24 19* 19*  GLUCOSE 111* 131* 105 323* 126*  BUN 37* 38* 39.8* 33* 29*  CALCIUM 8.8* 8.8* 9.4 8.6* 8.5*  CREATININE 2.58* 2.45* 1.9* 2.11* 2.32*  GFRNONAA 17* 18*  --  22* 19*  GFRAA 20* 21*  --  25* 22*    LIVER FUNCTION TESTS:  Recent Labs  07/20/15 1519 10/11/15 1425  BILITOT 0.3 0.3  AST 13* 20  ALT 8* 19  ALKPHOS 84 121  PROT 7.6 7.1  ALBUMIN 2.3* 2.3*    TUMOR MARKERS: No results for input(s): AFPTM, CEA, CA199, CHROMGRNA in the last 8760 hours.  Assessment and  Plan: Urosepsis (B)hydronephrosis by CT with elevated Cr. PCNs evaluated and are functioning fine with clear urine output and no evidence of obstruction or malposition.  Continue gravity bag drainage.  Thank you for this interesting consult.  A copy of this report was sent to the requesting provider on this date.  Electronically Signed: Ascencion Dike 10/12/2015, 11:38 AM   I spent a total of 20 minutes in face to face in clinical consultation, greater than 50% of which was counseling/coordinating care for bilateral hydronephrosis

## 2015-10-12 NOTE — Consult Note (Signed)
Consult: UTI, pelvic pain, hydronephrosis Requested by: Dr. Tyrell Antonio  History of Present Illness:  75 yo hispanic female followed by Dr. Tresa Moore for:  1 - Bilateral Hydronephrosis / Stage 4 Renal Insufficiency - bilateral mod-severe hydro to deep pelvis / mass area by CT 06/2015 on eval UTI and renal failure. Cr upper 2'-3's without hyperkalemia. Pt's family admits to several weeks of increasing malaise and lack of energy. Bilat neph tubes place 5/2 and exchanged 6/29.   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 No cancer dicrected therapy since 2003. PET-CT 07/2015 with minimal active disease. Cystoscopy September 12, 2015 in office revealed diffuse bladder erythema c/w radiation changes. NO intraluminal masses.   PMH sig for GYN cancer, DM2. She lives in [place] and is in [name]. Her graddaugher [name] is very involved with her care and lives in [place] and is reacahble at [phone]. [name]   She came to hospital with fever and vaginal pain. CT 7/21 revealed: 1. Increased, moderate to severe bilateral hydronephrosis with nephrostomy tubes in place, 2. Diffuse bladder wall thickening which may reflect urinary tract infection or be radiation induced, 3. New fluid in the endometrial canal which extends through a defect in the anterior uterine wall and is suspicious for vesicouterine fistula. 4. New right inguinal lymphadenopathy which may be reactive in the setting of infection.  Pelvic u/s revealed Thick-walled bladder containing complex fluid as well as masslike soft tissue posteriorly. This is concerning for neoplasm although hematoma could potentially give a similar appearance.  I reviewed CT and U/S images.   She is voiding without  difficulty but not a lot due to the Nx tubes. She is voiding with a good stream. She noted some red urine, no clots the last couple of days. She has urge incontinence which is stable. She has dry periods and is not continually wet. Her pain has improved. She is making good UOP.    Past Medical History  Diagnosis Date  . Diabetes mellitus without complication ([place])   . Uterine cancer ([place])    History reviewed. No pertinent past surgical history.  Home Medications:  Prescriptions prior to admission  Medication Sig Dispense Refill Last Dose  . acetaminophen (TYLENOL) 500 MG tablet Take 500 mg by mouth every 6 (six) hours as needed for mild pain.   Past Week at Unknown time  . glyBURIDE-metformin (GLUCOVANCE) 5-500 MG tablet Take 1 tablet by mouth 2 (two) times daily as needed (for high blood sugar). Blood sugar > 150 then pt takes, if < 95 pt does not take   10/11/2015 at Unknown time  . oxyCODONE-acetaminophen (PERCOCET/ROXICET) 5-325 MG tablet Take 1 tablet by mouth every 6 (six) hours as needed for moderate pain or severe pain. 30 tablet 0 10/10/2015 at Unknown time  . pantoprazole (PROTONIX) 40 MG tablet Take 1 tablet (40 mg total) by mouth daily. (Patient not taking: Reported on 08/12/2015) 30 tablet 0 Not Taking  . simethicone (MYLICON) 80 MG chewable tablet Chew 1 tablet (80 mg total) by mouth 4 (four) times daily - after meals and at bedtime. (Patient not taking: Reported on 07/29/2015) 30 tablet 0 Not Taking   Allergies:  Allergies  Allergen Reactions  . Penicillins Itching, Nausea And Vomiting and Rash    Has patient  had a PCN reaction causing immediate rash, facial/tongue/throat swelling, SOB or lightheadedness with hypotension: Yes Has patient had a PCN reaction causing severe rash involving mucus membranes or skin necrosis: No Has patient had a PCN reaction that required hospitalization No Has patient had a PCN reaction occurring within the last 10 years: No If all of the above  answers are "NO", then may proceed with Cephalosporin use.     No family history on file. Social History:  reports that she has never smoked. She does not have any smokeless tobacco history on file. She reports that she does not drink alcohol or use illicit drugs.  ROS: A complete review of systems was performed.  All systems are negative except for pertinent findings as noted. Review of Systems  All other systems reviewed and are negative.    Physical Exam:  Vital signs in last 24 hours: Temp:  [97.7 F (36.5 C)-101.9 F (38.8 C)] 97.7 F (36.5 C) (07/22 0553) Pulse Rate:  [94-109] 98 (07/22 0553) Resp:  [15-24] 18 (07/22 0553) BP: (116-154)/(48-85) 129/48 mmHg (07/22 0553) SpO2:  [95 %-100 %] 100 % (07/22 0553) Weight:  [46.811 kg (103 lb 3.2 oz)] 46.811 kg (103 lb 3.2 oz) (07/21 2153) General:  Alert and oriented, No acute distress HEENT: Normocephalic, atraumatic Lungs: Regular rate and effort Abdomen: Soft, nontender, nondistended, no abdominal masses, bladder not palpable Back: No CVA tenderness; both Nx tubes with good UOP, right now the right bag fuller than the left Extremities: No edema Neurologic: Grossly intact  Laboratory Data:  Results for orders placed or performed during the hospital encounter of 10/11/15 (from the past 24 hour(s))  CBC     Status: Abnormal   Collection Time: 10/11/15  2:25 PM  Result Value Ref Range   WBC 19.2 (H) 4.0 - 10.5 K/uL   RBC 2.78 (L) 3.87 - 5.11 MIL/uL   Hemoglobin 8.4 (L) 12.0 - 15.0 g/dL   HCT 25.9 (L) 36.0 - 46.0 %   MCV 93.2 78.0 - 100.0 fL   MCH 30.2 26.0 - 34.0 pg   MCHC 32.4 30.0 - 36.0 g/dL   RDW 14.6 11.5 - 15.5 %   Platelets 294 150 - 400 K/uL  Comprehensive metabolic panel     Status: Abnormal   Collection Time: 10/11/15  2:25 PM  Result Value Ref Range   Sodium 129 (L) 135 - 145 mmol/L   Potassium 4.8 3.5 - 5.1 mmol/L   Chloride 102 101 - 111 mmol/L   CO2 19 (L) 22 - 32 mmol/L   Glucose, Bld 323 (H) 65 - 99  mg/dL   BUN 33 (H) 6 - 20 mg/dL   Creatinine, Ser 2.11 (H) 0.44 - 1.00 mg/dL   Calcium 8.6 (L) 8.9 - 10.3 mg/dL   Total Protein 7.1 6.5 - 8.1 g/dL   Albumin 2.3 (L) 3.5 - 5.0 g/dL   AST 20 15 - 41 U/L   ALT 19 14 - 54 U/L   Alkaline Phosphatase 121 38 - 126 U/L   Total Bilirubin 0.3 0.3 - 1.2 mg/dL   GFR calc non Af Amer 22 (L) >60 mL/min   GFR calc Af Amer 25 (L) >60 mL/min   Anion gap 8 5 - 15  I-Stat CG4 Lactic Acid, ED  (not at  [facility])     Status: None   Collection Time: 10/11/15  4:02 PM  Result Value Ref Range   Lactic Acid, Venous 1.61 0.5 - 1.9 mmol/L  POC occult  blood, ED Provider will collect     Status: None   Collection Time: 10/11/15  4:02 PM  Result Value Ref Range   Fecal Occult Bld NEGATIVE NEGATIVE  Urinalysis, Routine w reflex microscopic (not at [facility])     Status: Abnormal   Collection Time: 10/11/15  4:16 PM  Result Value Ref Range   Color, Urine YELLOW YELLOW   APPearance TURBID (A) CLEAR   Specific Gravity, Urine 1.017 1.005 - 1.030   pH 6.5 5.0 - 8.0   Glucose, UA 100 (A) NEGATIVE mg/dL   Hgb urine dipstick LARGE (A) NEGATIVE   Bilirubin Urine NEGATIVE NEGATIVE   Ketones, ur NEGATIVE NEGATIVE mg/dL   Protein, ur >300 (A) NEGATIVE mg/dL   Nitrite NEGATIVE NEGATIVE   Leukocytes, UA LARGE (A) NEGATIVE  Urine microscopic-add on     Status: Abnormal   Collection Time: 10/11/15  4:16 PM  Result Value Ref Range   Squamous Epithelial / LPF NONE SEEN NONE SEEN   WBC, UA TOO NUMEROUS TO COUNT 0 - 5 WBC/hpf   RBC / HPF 6-30 0 - 5 RBC/hpf   Bacteria, UA MANY (A) NONE SEEN  I-Stat CG4 Lactic Acid, ED  (not at  [facility])     Status: None   Collection Time: 10/11/15  8:46 PM  Result Value Ref Range   Lactic Acid, Venous 0.63 0.5 - 1.9 mmol/L  Glucose, capillary     Status: Abnormal   Collection Time: 10/11/15  9:56 PM  Result Value Ref Range   Glucose-Capillary 160 (H) 65 - 99 mg/dL   Comment 1 Notify RN    Comment 2 Document in Chart   Type and screen [place]     Status: None   Collection Time: 10/11/15 10:12 PM  Result Value Ref Range   ABO/RH(D) O POS    Antibody Screen NEG    Sample Expiration 10/14/2015   Protime-INR     Status: Abnormal   Collection Time: 10/11/15 10:12 PM  Result Value Ref Range   Prothrombin Time 17.5 (H) 11.6 - 15.2 seconds   INR 1.43 0.00 - 1.49  APTT     Status: None   Collection Time: 10/11/15 10:12 PM  Result Value Ref Range   aPTT 37 24 - 37 seconds  Basic metabolic panel     Status: Abnormal   Collection Time: 10/12/15  4:07 AM  Result Value Ref Range   Sodium 135 135 - 145 mmol/L   Potassium 4.1 3.5 - 5.1 mmol/L   Chloride 111 101 - 111 mmol/L   CO2 19 (L) 22 - 32 mmol/L   Glucose, Bld 126 (H) 65 - 99 mg/dL   BUN 29 (H) 6 - 20 mg/dL   Creatinine, Ser 2.32 (H) 0.44 - 1.00 mg/dL   Calcium 8.5 (L) 8.9 - 10.3 mg/dL   GFR calc non Af Amer 19 (L) >60 mL/min   GFR calc Af Amer 22 (L) >60 mL/min   Anion gap 5 5 - 15  CBC     Status: Abnormal   Collection Time: 10/12/15  4:07 AM  Result Value Ref Range   WBC 19.7 (H) 4.0 - 10.5 K/uL   RBC 2.60 (L) 3.87 - 5.11 MIL/uL   Hemoglobin 8.0 (L) 12.0 - 15.0 g/dL   HCT 24.0 (L) 36.0 - 46.0 %   MCV 92.3 78.0 - 100.0 fL   MCH 30.8 26.0 - 34.0 pg   MCHC 33.3 30.0 - 36.0 g/dL  RDW 14.6 11.5 - 15.5 %   Platelets 257 150 - 400 K/uL  Prealbumin     Status: Abnormal   Collection Time: 10/12/15  4:07 AM  Result Value Ref Range   Prealbumin 7.8 (L) 18 - 38 mg/dL  Glucose, capillary     Status: Abnormal   Collection Time: 10/12/15  4:27 AM  Result Value Ref Range   Glucose-Capillary 113 (H) 65 - 99 mg/dL  Glucose, capillary     Status: Abnormal   Collection Time: 10/12/15  7:46 AM  Result Value Ref Range   Glucose-Capillary 105 (H) 65 - 99 mg/dL  Glucose, capillary     Status: Abnormal   Collection Time: 10/12/15 12:03 PM  Result Value Ref Range   Glucose-Capillary 120 (H) 65 - 99 mg/dL   No results found for this or any previous visit (from  the past 240 hour(s)). Creatinine:  Recent Labs  10/11/15 1425 10/12/15 0407  CREATININE 2.11* 2.32*    Impression/Assessment/plan: -Bilateral Hydronephrosis / Stage 4 Renal Insufficiency -- perc tubes recently changed. I had IR come by to check the Nx tubes and they were flushed. She is making good UOP. She has been AFVSS today. Do not feel she needs tube changes at this point.   -Bladder mass - maybe hematoma or thickened bladder wall. There was not a lot of rbc's on her U/A. Always concern for fistula developing but incontinence seems to be stable. Pt voiding without difficulty and bladder not distended on exam. Recent office cysto showed no mass. Check PVR.   UTI - on abx - Cx pending.   Will follow.    Heidi [name] 10/12/2015, 12:42 PM

## 2015-10-12 NOTE — Progress Notes (Signed)
Initial Nutrition Assessment  DOCUMENTATION CODES:  Not applicable  INTERVENTION:  Glucerna Shake po BID, each supplement provides 220 kcal and 10 grams of protein  Magic Cup q 24 hrs. 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 Other symptoms include poor po intake, wt loss of 6-7 lbs x 1 month, fevers, weakness. 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    Pt does not speak english. Family member at bedside acted as Optometrist. Pt reports that the poor PO intake is very recent and has only been going on for the last couple days or so. Pt denied any n/v/c/d. Pt did not take any vitamins at home or drink any nutritional supplements.   Per family member, when pt first moved to [place] in November 2016, she weighed 109 lbs. Per chart review, pt looks to have gained weight in the short term.   RD asked pt if their was anything at all she was hungry for. She said no. RD asked about favorite foods, but she did not say any. RD asked if she liked ice cream, she stated yes. RD will add a Magic cup to her lunch tray. Continue the Glucerna BID  Unable to do physical assessment at this time, will re-attempt on f/u   Labs reviewed: Hyperglycemia, prealbumin: 7.8, WBC: 19.7, anemic     Recent Labs Lab 10/11/15 1425 10/12/15 0407  NA 129* 135  K 4.8 4.1  CL 102 111  CO2 19* 19*  BUN 33* 29*   CREATININE 2.11* 2.32*  CALCIUM 8.6* 8.5*  GLUCOSE 323* 126*   Diet Order:  Diet heart healthy/carb modified Room service appropriate?: Yes; Fluid consistency:: Thin  Skin:  Reviewed, no issues  Last BM:  7/22  Height:  Ht Readings from Last 1 Encounters:  10/11/15 5\' 2"  (1.575 m)   Weight:  Wt Readings from Last 1 Encounters:  10/11/15 103 lb 3.2 oz (46.811 kg)   Wt Readings from Last 10 Encounters:  10/11/15 103 lb 3.2 oz (46.811 kg)  08/12/15 100 lb 6.4 oz (45.541 kg)  07/29/15 97 lb 11.2 oz (44.316 kg)  07/24/15 98 lb 12.3 oz (44.8 kg)   Ideal Body Weight:  50 kg  BMI:  Body mass index is 18.87 kg/(m^2).  Estimated Nutritional Needs:  Kcal:  1500-1700 (32-36 kcal/kg bw) Protein:  50-60 g Pro (1-1.2 g/kg IBW) Fluid:  1.5-1.7 liters fluid  EDUCATION NEEDS:  No education needs identified at this time  Burtis Junes RD, LDN, [facility] Pager: [phone] 10/12/2015 4:39 PM

## 2015-10-12 NOTE — Progress Notes (Signed)
Late entry for 12/12/15 at 2145:  Received patient from the ED, via stretcher, accompanied by grand daughter. Patient is [name]  Grand daughter was translating while she was in the room.  Showed the the remote and phone, explained same. Placed on tele; 2nd skin check was done by Raynelle Dick, RN.  She has two urostomy bags, one on the right, one on the left, secured by waist elastic band.  Admission history done by Marene Lenz, RN via translating phone. Bed alarm is on, grand daughter left, daughter is at bed side.

## 2015-10-13 LAB — BASIC METABOLIC PANEL
ANION GAP: 6 (ref 5–15)
BUN: 24 mg/dL — ABNORMAL HIGH (ref 6–20)
CHLORIDE: 110 mmol/L (ref 101–111)
CO2: 19 mmol/L — AB (ref 22–32)
Calcium: 8.4 mg/dL — ABNORMAL LOW (ref 8.9–10.3)
Creatinine, Ser: 2.21 mg/dL — ABNORMAL HIGH (ref 0.44–1.00)
GFR calc Af Amer: 24 mL/min — ABNORMAL LOW (ref >60)
GFR calc non Af Amer: 20 mL/min — ABNORMAL LOW (ref >60)
GLUCOSE: 66 mg/dL (ref 65–99)
POTASSIUM: 3.8 mmol/L (ref 3.5–5.1)
Sodium: 135 mmol/L (ref 135–145)

## 2015-10-13 LAB — CBC
HEMATOCRIT: 24.3 % — AB (ref 36.0–46.0)
Hemoglobin: 7.7 g/dL — ABNORMAL LOW (ref 12.0–15.0)
MCH: 29.8 pg (ref 26.0–34.0)
MCHC: 31.7 g/dL (ref 30.0–36.0)
MCV: 94.2 fL (ref 78.0–100.0)
Platelets: 261 10*3/uL (ref 150–400)
RBC: 2.58 MIL/uL — AB (ref 3.87–5.11)
RDW: 15.1 % (ref 11.5–15.5)
WBC: 21.4 10*3/uL — AB (ref 4.0–10.5)

## 2015-10-13 LAB — URINE CULTURE: Culture: >=100000 — AB

## 2015-10-13 LAB — GLUCOSE, CAPILLARY
GLUCOSE-CAPILLARY: 86 mg/dL (ref 65–99)
GLUCOSE-CAPILLARY: 116 mg/dL — AB (ref 65–99)
GLUCOSE-CAPILLARY: 273 mg/dL — AB (ref 65–99)
Glucose-Capillary: 59 mg/dL — ABNORMAL LOW (ref 65–99)

## 2015-10-13 LAB — PREPARE RBC (CROSSMATCH)

## 2015-10-13 MED ORDER — SODIUM CHLORIDE 0.9 % IV SOLN
Freq: Once | INTRAVENOUS | Status: AC
  Administered 2015-10-13: 250 mL via INTRAVENOUS

## 2015-10-13 MED ORDER — DEXTROSE 5 % IV SOLN
1.0000 g | INTRAVENOUS | Status: DC
  Administered 2015-10-13 – 2015-10-18 (×6): 1 g via INTRAVENOUS
  Filled 2015-10-13 (×8): qty 10

## 2015-10-13 NOTE — Progress Notes (Signed)
Patient had CBG this AM 59 (asymptomatic). After 4 oz of orange juice her CBG=86. MD notified.  At this time, patient is eating breakfast; no complaints; no acute distress noted. Will continue to monitor,.

## 2015-10-13 NOTE — Progress Notes (Signed)
Per Dr. Junious Silk order, the Foley (16Fr) was placed to max. drain system (increased temp and WBC). Foley patent, draining cloudy, sediment urine. Patient tolerated well and will continue to monitor.

## 2015-10-13 NOTE — Progress Notes (Signed)
Pt has some SP discomfort.   Vitals:   10/12/15 2132 10/13/15 0711  BP: (!) 125/51 (!) 102/47  Pulse: (!) 110 99  Resp: 19 18  Temp: (!) 100.8 F (38.2 C) 98.3 F (36.8 C)    Intake/Output Summary (Last 24 hours) at 10/13/15 0916 Last data filed at 10/13/15 [id number]  Gross per 24 hour  Intake             1725 ml  Output             1525 ml  Net              200 ml   PE: NAD - eating breakfast - sausage and eggs. She looks well. Comfortable.   CBC    Component Value Date/Time   WBC 21.4 (H) 10/13/2015 0509   RBC 2.58 (L) 10/13/2015 0509   HGB 7.7 (L) 10/13/2015 0509   HCT 24.3 (L) 10/13/2015 0509   PLT 261 10/13/2015 0509   MCV 94.2 10/13/2015 0509   MCH 29.8 10/13/2015 0509   MCHC 31.7 10/13/2015 0509   RDW 15.1 10/13/2015 0509   LYMPHSABS 1.6 07/20/2015 1735   MONOABS 0.7 07/20/2015 1735   EOSABS 0.2 07/20/2015 1735   BASOSABS 0.0 07/20/2015 1735   BMET    Component Value Date/Time   NA 135 10/13/2015 0509   NA 133 (L) 07/29/2015 1624   K 3.8 10/13/2015 0509   K 4.5 07/29/2015 1624   CL 110 10/13/2015 0509   CO2 19 (L) 10/13/2015 0509   CO2 24 07/29/2015 1624   GLUCOSE 66 10/13/2015 0509   GLUCOSE 105 07/29/2015 1624   BUN 24 (H) 10/13/2015 0509   BUN 39.8 (H) 07/29/2015 1624   CREATININE 2.21 (H) 10/13/2015 0509   CREATININE 1.9 (H) 07/29/2015 1624   CALCIUM 8.4 (L) 10/13/2015 0509   CALCIUM 9.4 07/29/2015 1624   GFRNONAA 20 (L) 10/13/2015 0509   GFRAA 24 (L) 10/13/2015 0509    A/P -  Hydronephrosis - bilateral Nx tubes, good UOP, Cr at baseline  UTI - wbc count up and temp of 100. Cx growing proteus. Pt and family not too keen on a foley yesterday, but discussed temp and inc wbc and need to place foley to max drain system. They are agreeable.  A vesicovaginal or vesicouterine fistula is not uncommon.   I'll notify Dr. Tresa Moore of admission.

## 2015-10-13 NOTE — Progress Notes (Signed)
Patient ID: Heidi [name], female   DOB: 29-Sep-1940, 75 y.o.   MRN: [id number] Reason for Consult: Cervical Cancer Referring Physician: Niel Hummer, MD  Heidi [name] is an 75 y.o. female.  HPI: She carries a diagnosis of cervical cancer and is admitted with urosepsis.  A CT scan of the abdomen/pelvis is suggestive of a vesicovaginal fistula.  She has a history of urinary incontinence which is stable at present.  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  She presented to the [facility] ED with a several day history of fever, suprapubic pain and anorexia. She was admitted and started on Ceftazidine,  Yesterday she had an episode of scant vaginal bleeding.  She denies any abnormal vaginal discharge.  Her symptoms are improving, her appetite is returning, there has been a recrudescence of pain. A urine C&S grew out Proteus.  Blood cultures have been negative to date. A WBC was 19.4 on admission, 21.4 today. Serum Cr stable in the 2 range. Serial Hgb levels have been in the 7 - 8 range; she currently being transfused. Interval history: She reports a remote (2003) history of cervical cancer that was treated with primary chemoradiation at [facility] [place]. She had an ex lap as part of workup of her cancer, but hysterectomy was not performed. She had a complete response to primary therapy and did attend surveillance visits for many years and had no evidence of recurrence.  However, she developed abdominal pain and symptoms of urosepsis in April, 2017 and was admitted to [facility] on 07/20/15. Upon admission her creatinine was noted to be very elevated (3.37). She had findings of Klebsiella pneumoniae bacteriuria and was treated with antibiotics. She underwent imaging of the abdomen and pelvis (non contrasted CT) which showed: Bilateral hydronephrosis, moderate-to-severe  in degree, favored to be chronic given the lack of significant perinephric inflammation. Poorly defined soft tissue mass/thickening within the pelvis, surrounding the bladder and within the bilateral adnexal regions, difficult to definitively characterize without intravascular contrast. This poorly defined soft tissue mass/thickening is the likely source of the bilateral obstructive uropathy. This may represent local spread of neoplastic mass related to the given history of endometrial cancer. Alternatively, this could represent postsurgical and/or post-treatment changes (scarring/fibrosis). As above, there is soft tissue mass/thickening circumferentially about the bladder. This may represent bladder wall thickening or an adjacent soft tissue encasement. Surgical clips are noted along the bilateral pelvic sidewalls.   She underwent bilateral percutaneous nephrostomy tube placement on 07/23/15 and received a consultation with Urologist, Dr Tresa Moore. Her creatinine trended down after placement of the percutaneous nephrostomy tubes.   On May 8th a pap was taken that was unremarkable and her pelvic exam revealed a shortened vagina with no apparent pelvic masses.  On 08/05/15 PET/CT was performed and revealed: minimal hypermetabolism within left periaortic and right inguinal lymph nodes, nonspecific and possibly reactive. Attention on followup exams is warranted. Mild vague hypermetabolism along the left vaginal cuff without a definite CT correlate. Findings may be treatment related..  Past Medical History:  Diagnosis Date  . Diabetes mellitus without complication ([place])   . Uterine cancer ([place])     History reviewed. No pertinent surgical history.  No family history on file.  Social History:  reports that she has never smoked. She does not have any smokeless tobacco history on file. She reports that she does not drink alcohol or use drugs.  Allergies:  Allergies  Allergen Reactions  . Penicillins  Itching, Nausea And Vomiting and Rash    Has patient had a PCN reaction causing immediate rash, facial/tongue/throat swelling, SOB or lightheadedness with hypotension: Yes Has patient had a PCN reaction causing severe rash involving mucus membranes or skin necrosis: No Has patient had a PCN reaction that required hospitalization No Has patient had a PCN reaction occurring within the last 10 years: No If all of the above answers are "NO", then may proceed with Cephalosporin use.     Medications: I have reviewed the patient's current medications.  Results for orders placed or performed during the hospital encounter of 10/11/15 (from the past 48 hour(s))  I-Stat CG4 Lactic Acid, ED  (not at  [facility])     Status: None   Collection Time: 10/11/15  8:46 PM  Result Value Ref Range   Lactic Acid, Venous 0.63 0.5 - 1.9 mmol/L  Glucose, capillary     Status: Abnormal   Collection Time: 10/11/15  9:56 PM  Result Value Ref Range   Glucose-Capillary 160 (H) 65 - 99 mg/dL   Comment 1 Notify RN    Comment 2 Document in Chart   Type and screen [place]     Status: None (Preliminary result)   Collection Time: 10/11/15 10:12 PM  Result Value Ref Range   ABO/RH(D) O POS    Antibody Screen NEG    Sample Expiration 10/14/2015    Unit Number [id number]    Blood Component Type RED CELLS,LR    Unit division 00    Status of Unit ISSUED    Transfusion Status OK TO TRANSFUSE    Crossmatch Result Compatible    Unit Number [id number]    Blood Component Type RED CELLS,LR    Unit division 00    Status of Unit ISSUED    Transfusion Status OK TO TRANSFUSE    Crossmatch Result Compatible   Protime-INR     Status: Abnormal   Collection Time: 10/11/15 10:12 PM  Result Value Ref Range   Prothrombin Time 17.5 (H) 11.6 - 15.2 seconds   INR 1.43 0.00 - 1.49  APTT     Status: None   Collection Time: 10/11/15 10:12 PM  Result Value Ref Range   aPTT 37 24 - 37 seconds    Comment:         IF BASELINE aPTT IS ELEVATED, SUGGEST PATIENT RISK ASSESSMENT BE USED TO DETERMINE APPROPRIATE ANTICOAGULANT THERAPY.   Basic metabolic panel     Status: Abnormal   Collection Time: 10/12/15  4:07 AM  Result Value Ref Range   Sodium 135 135 - 145 mmol/L   Potassium 4.1 3.5 - 5.1 mmol/L   Chloride 111 101 - 111 mmol/L   CO2 19 (L) 22 - 32 mmol/L   Glucose, Bld 126 (H) 65 - 99 mg/dL   BUN 29 (H) 6 - 20 mg/dL   Creatinine, Ser 2.32 (H) 0.44 - 1.00 mg/dL   Calcium 8.5 (L) 8.9 - 10.3 mg/dL   GFR calc non Af Amer 19 (L) >60 mL/min   GFR calc Af Amer 22 (L) >60 mL/min    Comment: (NOTE) The eGFR has been calculated using the CKD EPI equation. This calculation has not been validated in all clinical situations. eGFR's persistently <60 mL/min signify possible Chronic Kidney Disease.    Anion gap 5 5 - 15  CBC     Status: Abnormal   Collection Time: 10/12/15  4:07 AM  Result Value Ref Range   WBC 19.7 (H) 4.0 - 10.5 K/uL   RBC 2.60 (L) 3.87 - 5.11 MIL/uL   Hemoglobin 8.0 (L) 12.0 - 15.0 g/dL   HCT 24.0 (L) 36.0 - 46.0 %   MCV 92.3 78.0 - 100.0 fL   MCH 30.8 26.0 - 34.0 pg   MCHC 33.3 30.0 - 36.0 g/dL   RDW 14.6 11.5 - 15.5 %   Platelets 257 150 - 400 K/uL  Prealbumin     Status: Abnormal   Collection Time: 10/12/15  4:07 AM  Result Value Ref Range   Prealbumin 7.8 (L) 18 - 38 mg/dL  Glucose, capillary     Status: Abnormal   Collection Time: 10/12/15  4:27 AM  Result Value Ref Range   Glucose-Capillary 113 (H) 65 - 99 mg/dL  Glucose, capillary     Status: Abnormal   Collection Time: 10/12/15  7:46 AM  Result Value Ref Range   Glucose-Capillary 105 (H) 65 - 99 mg/dL  Glucose, capillary     Status: Abnormal   Collection Time: 10/12/15 12:03 PM  Result Value Ref Range   Glucose-Capillary 120 (H) 65 - 99 mg/dL  Glucose, capillary     Status: Abnormal   Collection Time: 10/12/15  5:24 PM  Result Value Ref Range   Glucose-Capillary 151 (H) 65 - 99 mg/dL  Glucose, capillary      Status: Abnormal   Collection Time: 10/12/15  9:35 PM  Result Value Ref Range   Glucose-Capillary 145 (H) 65 - 99 mg/dL   Comment 1 Notify RN    Comment 2 Document in Chart   CBC     Status: Abnormal   Collection Time: 10/13/15  5:09 AM  Result Value Ref Range   WBC 21.4 (H) 4.0 - 10.5 K/uL   RBC 2.58 (L) 3.87 - 5.11 MIL/uL   Hemoglobin 7.7 (L) 12.0 - 15.0 g/dL   HCT 24.3 (L) 36.0 - 46.0 %   MCV 94.2 78.0 - 100.0 fL   MCH 29.8 26.0 - 34.0 pg   MCHC 31.7 30.0 - 36.0 g/dL   RDW 15.1 11.5 - 15.5 %   Platelets 261 150 - 400 K/uL  Basic metabolic panel     Status: Abnormal   Collection Time: 10/13/15  5:09 AM  Result Value Ref Range   Sodium 135 135 - 145 mmol/L   Potassium 3.8 3.5 - 5.1 mmol/L   Chloride 110 101 - 111 mmol/L   CO2 19 (L) 22 - 32 mmol/L   Glucose, Bld 66 65 - 99 mg/dL   BUN 24 (H) 6 - 20 mg/dL   Creatinine, Ser 2.21 (H) 0.44 - 1.00 mg/dL   Calcium 8.4 (L) 8.9 - 10.3 mg/dL   GFR calc non Af Amer 20 (L) >60 mL/min   GFR calc Af Amer 24 (L) >60 mL/min    Comment: (NOTE) The eGFR has been calculated using the CKD EPI equation. This calculation has not been validated in all clinical situations. eGFR's persistently <60 mL/min signify possible Chronic Kidney Disease.    Anion gap 6 5 - 15  Prepare RBC     Status: None   Collection Time: 10/13/15  7:33 AM  Result Value Ref Range   Order Confirmation ORDER PROCESSED BY BLOOD BANK   Glucose, capillary     Status: Abnormal   Collection Time: 10/13/15  7:54 AM  Result Value Ref Range   Glucose-Capillary 59 (L) 65 - 99 mg/dL  Glucose, capillary  Status: None   Collection Time: 10/13/15  8:22 AM  Result Value Ref Range   Glucose-Capillary 86 65 - 99 mg/dL  Glucose, capillary     Status: Abnormal   Collection Time: 10/13/15 12:01 PM  Result Value Ref Range   Glucose-Capillary 116 (H) 65 - 99 mg/dL  Glucose, capillary     Status: Abnormal   Collection Time: 10/13/15  4:56 PM  Result Value Ref Range    Glucose-Capillary 273 (H) 65 - 99 mg/dL    Ct Abdomen Pelvis Wo Contrast  Result Date: 10/11/2015 CLINICAL DATA:  Fever. Pelvic pain. History of cervical cancer. Bilateral hydronephrosis. EXAM: CT ABDOMEN AND PELVIS WITHOUT CONTRAST TECHNIQUE: Multidetector CT imaging of the abdomen and pelvis was performed following the standard protocol without IV contrast. COMPARISON:  PET-CT 08/05/2015.  CT abdomen and pelvis 07/21/2015. FINDINGS: Minimal atelectasis is present in the lung bases. There is no pleural effusion. A punctate calcification in the right hepatic lobe is unchanged. The liver is enlarged, measuring 21 cm in length. The multiple gallstones are again seen. There is no evidence of significant biliary dilatation. The spleen, adrenal glands, and pancreas have an unremarkable unenhanced appearance. Bilateral nephrostomy tubes remain in place. There is moderate to severe bilateral hydronephrosis which has increased from the prior PET-CT. Both ureters are also mildly to moderately dilated diffusely. There is no evidence of bowel obstruction. Diffuse aortoiliac atherosclerosis is noted. Subcentimeter para-aortic lymph nodes are similar to the prior PET-CT. A 1.5 cm right inguinal lymph node is incompletely visualized but has enlarged (series 201, image 84, previously 1.1 cm). Adjacent subcentimeter right inguinal lymph nodes are also slightly larger. Surgical clips are again noted bilaterally in the pelvis. The bladder is mildly distended with moderate diffuse bladder wall thickening. There is new distension of the endometrial canal by low-density fluid which appears to extend anteriorly through a defect in the uterine wall and may communicate with the posterior bladder. No extraluminal fluid collection/abscess is identified in the pelvis. Soft tissue thickening is again seen in the pelvis likely reflecting the sequelae of prior radiation therapy. No free fluid is identified. No suspicious lytic blastic  osseous lesions are identified. Grade 1 anterolisthesis of L4 on L5 is unchanged. IMPRESSION: 1. Increased, moderate to severe bilateral hydronephrosis with nephrostomy tubes in place. 2. Diffuse bladder wall thickening which may reflect urinary tract infection or be radiation induced. 3. New fluid in the endometrial canal which extends through a defect in the anterior uterine wall and is suspicious for vesicouterine fistula. 4. New right inguinal lymphadenopathy which may be reactive in the setting of infection. Electronically Signed   By: Logan Bores M.D.   On: 10/11/2015 18:54   Dg Chest 2 View  Result Date: 10/11/2015 CLINICAL DATA:  Fever. EXAM: CHEST  2 VIEW COMPARISON:  PET of 08/05/2015. FINDINGS: No prior plain film. Lateral view degraded by patient arm position. Artifact degraded lateral view posteriorly. Mild right hemidiaphragm elevation. Midline trachea. Normal heart size. Atherosclerosis in the transverse aorta. No pleural effusion or pneumothorax. Clear lungs. IMPRESSION: No acute process or explanation for fever. Aortic atherosclerosis. Electronically Signed   By: Abigail Miyamoto M.D.   On: 10/11/2015 17:59   US Transvaginal Non-ob  Result Date: 10/12/2015 CLINICAL DATA:  Vaginal bleeding. History of cervical cancer status post radiation therapy. EXAM: TRANSABDOMINAL AND TRANSVAGINAL ULTRASOUND OF PELVIS TECHNIQUE: Both transabdominal and transvaginal ultrasound examinations of the pelvis were performed. Transabdominal technique was performed for global imaging of the pelvis including uterus,  ovaries, adnexal regions, and pelvic cul-de-sac. It was necessary to proceed with endovaginal exam following the transabdominal exam to visualize the endometrium. COMPARISON:  CT abdomen and pelvis earlier today FINDINGS: Uterus Poorly visualized and only seen transabdominally. Due to patient pain and guarding, the uterus could not be visualized transvaginally and the patient could not tolerate any  additional imaging attempts. The uterus measures approximately 9.1 cm in length and 4.1 cm in AP thickness. Endometrium Unable to be evaluated. Right ovary Not visualized. Left ovary Not visualized. Other findings The bladder is diffusely thick-walled as seen on earlier CT and contains complex fluid throughout. There is masslike soft tissue in the posterior aspect of the bladder without internal vascularity demonstrated on color Doppler imaging. IMPRESSION: 1. Limited examination as above with poor visualization of the uterus. 2. Thick-walled bladder containing complex fluid as well as masslike soft tissue posteriorly. This is concerning for neoplasm although hematoma could potentially give a similar appearance. Cystoscopy may be helpful for further assessment. Electronically Signed   By: Logan Bores M.D.   On: 10/12/2015 00:37   US Pelvis Complete  Result Date: 10/12/2015 CLINICAL DATA:  Vaginal bleeding. History of cervical cancer status post radiation therapy. EXAM: TRANSABDOMINAL AND TRANSVAGINAL ULTRASOUND OF PELVIS TECHNIQUE: Both transabdominal and transvaginal ultrasound examinations of the pelvis were performed. Transabdominal technique was performed for global imaging of the pelvis including uterus, ovaries, adnexal regions, and pelvic cul-de-sac. It was necessary to proceed with endovaginal exam following the transabdominal exam to visualize the endometrium. COMPARISON:  CT abdomen and pelvis earlier today FINDINGS: Uterus Poorly visualized and only seen transabdominally. Due to patient pain and guarding, the uterus could not be visualized transvaginally and the patient could not tolerate any additional imaging attempts. The uterus measures approximately 9.1 cm in length and 4.1 cm in AP thickness. Endometrium Unable to be evaluated. Right ovary Not visualized. Left ovary Not visualized. Other findings The bladder is diffusely thick-walled as seen on earlier CT and contains complex fluid throughout.  There is masslike soft tissue in the posterior aspect of the bladder without internal vascularity demonstrated on color Doppler imaging. IMPRESSION: 1. Limited examination as above with poor visualization of the uterus. 2. Thick-walled bladder containing complex fluid as well as masslike soft tissue posteriorly. This is concerning for neoplasm although hematoma could potentially give a similar appearance. Cystoscopy may be helpful for further assessment. Electronically Signed   By: Logan Bores M.D.   On: 10/12/2015 00:37    Review of Systems  Constitutional: Positive for fever. Negative for weight loss.  Gastrointestinal: Positive for abdominal pain. Negative for nausea and vomiting.   Blood pressure (!) 134/51, pulse 91, temperature 98.6 F (37 C), temperature source Oral, resp. rate 18, height _0  (1.575 m), weight 103 lb 3.2 oz (46.8 kg), SpO2 100 %. Physical Exam  Constitutional: She is oriented to person, place, and time.  Thin  HENT:  Head: Normocephalic and atraumatic.  Neck: Neck supple.  Cardiovascular: Regular rhythm.   GI: She exhibits no distension. There is no tenderness.  Musculoskeletal: She exhibits no edema or tenderness.  Neurological: She is alert and oriented to person, place, and time.    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 Denman George of the admission >could add perineal care if symptoms of the fistula worsen as she already has nephrostomy tubes in place     JACKSON-MOORE,Gwendy Boeder A 10/13/2015, 5:13 PM

## 2015-10-13 NOTE — Progress Notes (Signed)
PROGRESS NOTE    Heidi [name]  [id number] DOB: December 11, 1940 DOA: 10/11/2015 PCP: No PCP Per Patient   Brief Narrative: Heidi [name] is a 75 y.o. 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 Patient reports lower abdominal pain and difficulty urinating and defecating. Patient with fever up to 101F, WBC of 19.4, tachycardia, tachypnea, and UA positive for signs of a UTI. Treating as sepsis secondary to urinary tract infection risk factors - Follow up blood/urine cultures growing proteus  sensitive to ceftriaxone.  - Change ceftazidine to ceftriaxone.  - Tylenol prn fever - Hydrocodone prn pain -wbc trending up. Continue with fluids and IV antibiotics.  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 Most likely following patient's history of uterine cancer and radiation. Nephrostomy tubes reported to be last changed on 6/22. - ostomy care consult -IR evaluated patient, nephrostomy functioning well.   Question vesicoureteral fistula/ Question Urethral vs. Vaginal bleeding: CT found possible fistula formation, right inguinal lymphadenopathy. Family also gives report of intermittent bleeding when wiping self after urinating that  could be coming from the bladder, or ureters but would want to make sure. Especially in the setting of recent weight loss and previous CT in 06/2015 noting mildly avid nodes and small focus within the endometrial cavity. Although certain findings could be secondary to the infection as seen above. Patient has been seen by Dr. Denman George of OB/GYN and previously by Dr. Tresa Moore of Urology.  - Transvaginal U/S to evaluate the endometrium showed mass vs hematoma  -urology consulted. Appreciate Dr Patsy [place] help. Ultrasound finding hematoma vs thickening of bladder. Recent cystoscopy in the office negative for mass.  -oncology consulted.   AKI; on chronic stage iv  Creatinine on admission 2.11 with a BUN of 33; which appears overall similar to last value obtained in March of 1.9. Elevated BUN could suggest some signs of dehydration in collaboration with history of decreased po intake. - IV fluids -cr stable. Follow trend.  -foley catheter inserted.   Hyperglycemia with Diabetes mellitus type 2: Acute. Glucose elevated at 323 on admission likely secondary to underlying infection. Patient currently on only oral medications at this time. Patient's last hemoglobin A1c is unknown at this time. - Hypoglycemic protocol - Held glyburide-metformin  -hold levemir due to hypoglycemia this am.  - likely need to discontinue metformin at discharge due to renal; failure.   Anemia: Hemoglobin previously between 9 and 10, but on presentation 8.4. The patient notes the vaginal bleeding with wiping after urination. Patient also found to have blood on urinalysis. Question exact source of this time. - T&S for possible need of blood products - hb trending down, transfuse 2 unit    Hyponatremia: Sodium 129 admission. Patient still hyponatremic even after correction for hyperglycemia. - improved with IV fluids.   Hypoalbuminemia: Albumin 2.3 on admission.  - Check pre-albumin 7.8 - Glucerna shakes with meals  DVT  prophylaxis: scd.  Code Status: full code. Discussed with patient Family Communication: daughter at bedside.  Disposition Plan: remain inpatient for IV antibiotics.    Consultants:   Urology    Procedures:   none  Antimicrobials:   cefta 7-22---7-23   ceftriaxone 7-23   Subjective: Still with supra-pubic pain, but feels better.   Objective: Vitals:   10/13/15 0926 10/13/15 0949 10/13/15 1205 10/13/15 1230  BP: (!) 117/47 (!) 124/56 (!) 110/48 (!) 109/50  Pulse: (!) 101 99 90 87  Resp: 18 18 14 17   Temp: 98.6 F (37 C) 98.5 F (36.9 C) 98.1 F (36.7 C) 98.7 F (37.1 C)  TempSrc: Axillary Axillary  Oral  SpO2: 100% 100% 100% 99%  Weight:      Height:        Intake/Output Summary (Last 24 hours) at 10/13/15 1413 Last data filed at 10/13/15 1230  Gross per 24 hour  Intake             1502 ml  Output             1800 ml  Net             -298 ml   Filed Weights   10/11/15 2153  Weight: 46.8 kg (103 lb 3.2 oz)    Examination:  General exam: Appears calm and comfortable  Respiratory system: Clear to auscultation. Respiratory effort normal. Cardiovascular system: S1 & S2 heard, RRR. No JVD, murmurs, rubs, gallops or clicks. No pedal edema. Gastrointestinal system: Abdomen is nondistended, soft and supra-pubic tenderness. No organomegaly or masses felt. Normal bowel sounds heard. Supra-pubic tenderness, nephrostomy draining clear urine  Central nervous system: Alert and oriented. No focal neurological deficits. Extremities: Symmetric 5 x 5 power. Skin: No rashes, lesions or ulcers Psychiatry: Judgement and insight appear normal. Mood & affect appropriate.     Data Reviewed: I have personally reviewed following labs and imaging studies  CBC:  Recent Labs Lab 10/11/15 1425 10/12/15 0407 10/13/15 0509  WBC 19.2* 19.7* 21.4*  HGB 8.4* 8.0* 7.7*  HCT 25.9* 24.0* 24.3*  MCV 93.2 92.3 94.2  PLT 294 257 0000000   Basic Metabolic Panel:  Recent Labs Lab  10/11/15 1425 10/12/15 0407 10/13/15 0509  NA 129* 135 135  K 4.8 4.1 3.8  CL 102 111 110  CO2 19* 19* 19*  GLUCOSE 323* 126* 66  BUN 33* 29* 24*  CREATININE 2.11* 2.32* 2.21*  CALCIUM 8.6* 8.5* 8.4*   GFR: Estimated Creatinine Clearance: 16 mL/min (by C-G formula based on SCr of 2.21 mg/dL). Liver Function Tests:  Recent Labs Lab 10/11/15 1425  AST 20  ALT 19  ALKPHOS 121  BILITOT 0.3  PROT 7.1  ALBUMIN 2.3*   No results for input(s): LIPASE, AMYLASE in the last 168 hours. No results for input(s): AMMONIA in the last 168 hours. Coagulation Profile:  Recent Labs Lab 10/11/15 2212  INR 1.43   Cardiac Enzymes: No results for input(s): CKTOTAL, CKMB, CKMBINDEX, TROPONINI in the last 168 hours. BNP (last 3 results) No results for input(s): PROBNP in the last 8760 hours. HbA1C: No results for input(s): HGBA1C in the last 72 hours. CBG:  Recent Labs Lab 10/12/15 1724 10/12/15 2135 10/13/15 0754 10/13/15 0822 10/13/15 1201  GLUCAP 151* 145* 59* 86 116*   Lipid Profile: No results for input(s): CHOL, HDL, LDLCALC, TRIG, CHOLHDL, LDLDIRECT in the last 72 hours. Thyroid Function Tests: No results for input(s): TSH, T4TOTAL, FREET4, T3FREE, THYROIDAB in  the last 72 hours. Anemia Panel: No results for input(s): VITAMINB12, FOLATE, FERRITIN, TIBC, IRON, RETICCTPCT in the last 72 hours. Sepsis Labs:  Recent Labs Lab 10/11/15 1602 10/11/15 2046  LATICACIDVEN 1.61 0.63    Recent Results (from the past 240 hour(s))  Blood Culture (routine x 2)     Status: None (Preliminary result)   Collection Time: 10/11/15  3:56 PM  Result Value Ref Range Status   Specimen Description BLOOD RIGHT FOREARM  Final   Special Requests IN PEDIATRIC BOTTLE 4CC  Final   Culture NO GROWTH 1 DAY  Final   Report Status PENDING  Incomplete  Urine culture     Status: Abnormal   Collection Time: 10/11/15  4:16 PM  Result Value Ref Range Status   Specimen Description URINE,  CATHETERIZED  Final   Special Requests NONE  Final   Culture >=100,000 COLONIES/mL PROTEUS MIRABILIS (A)  Final   Report Status 10/13/2015 FINAL  Final   Organism ID, Bacteria PROTEUS MIRABILIS (A)  Final      Susceptibility   Proteus mirabilis - MIC*    AMPICILLIN <=2 SENSITIVE Sensitive     CEFAZOLIN <=4 SENSITIVE Sensitive     CEFTRIAXONE <=1 SENSITIVE Sensitive     CIPROFLOXACIN <=0.25 SENSITIVE Sensitive     GENTAMICIN <=1 SENSITIVE Sensitive     IMIPENEM 4 SENSITIVE Sensitive     NITROFURANTOIN 128 RESISTANT Resistant     TRIMETH/SULFA <=20 SENSITIVE Sensitive     AMPICILLIN/SULBACTAM <=2 SENSITIVE Sensitive     PIP/TAZO <=4 SENSITIVE Sensitive     * >=100,000 COLONIES/mL PROTEUS MIRABILIS  Blood Culture (routine x 2)     Status: None (Preliminary result)   Collection Time: 10/11/15  4:16 PM  Result Value Ref Range Status   Specimen Description BLOOD LEFT FOREARM  Final   Special Requests BOTTLES DRAWN AEROBIC AND ANAEROBIC 5CC  Final   Culture NO GROWTH 1 DAY  Final   Report Status PENDING  Incomplete         Radiology Studies: Ct Abdomen Pelvis Wo Contrast  Result Date: 10/11/2015 CLINICAL DATA:  Fever. Pelvic pain. History of cervical cancer. Bilateral hydronephrosis. EXAM: CT ABDOMEN AND PELVIS WITHOUT CONTRAST TECHNIQUE: Multidetector CT imaging of the abdomen and pelvis was performed following the standard protocol without IV contrast. COMPARISON:  PET-CT 08/05/2015.  CT abdomen and pelvis 07/21/2015. FINDINGS: Minimal atelectasis is present in the lung bases. There is no pleural effusion. A punctate calcification in the right hepatic lobe is unchanged. The liver is enlarged, measuring 21 cm in length. The multiple gallstones are again seen. There is no evidence of significant biliary dilatation. The spleen, adrenal glands, and pancreas have an unremarkable unenhanced appearance. Bilateral nephrostomy tubes remain in place. There is moderate to severe bilateral  hydronephrosis which has increased from the prior PET-CT. Both ureters are also mildly to moderately dilated diffusely. There is no evidence of bowel obstruction. Diffuse aortoiliac atherosclerosis is noted. Subcentimeter para-aortic lymph nodes are similar to the prior PET-CT. A 1.5 cm right inguinal lymph node is incompletely visualized but has enlarged (series 201, image 84, previously 1.1 cm). Adjacent subcentimeter right inguinal lymph nodes are also slightly larger. Surgical clips are again noted bilaterally in the pelvis. The bladder is mildly distended with moderate diffuse bladder wall thickening. There is new distension of the endometrial canal by low-density fluid which appears to extend anteriorly through a defect in the uterine wall and may communicate with the posterior bladder. No extraluminal fluid collection/abscess  is identified in the pelvis. Soft tissue thickening is again seen in the pelvis likely reflecting the sequelae of prior radiation therapy. No free fluid is identified. No suspicious lytic blastic osseous lesions are identified. Grade 1 anterolisthesis of L4 on L5 is unchanged. IMPRESSION: 1. Increased, moderate to severe bilateral hydronephrosis with nephrostomy tubes in place. 2. Diffuse bladder wall thickening which may reflect urinary tract infection or be radiation induced. 3. New fluid in the endometrial canal which extends through a defect in the anterior uterine wall and is suspicious for vesicouterine fistula. 4. New right inguinal lymphadenopathy which may be reactive in the setting of infection. Electronically Signed   By: Logan Bores M.D.   On: 10/11/2015 18:54   Dg Chest 2 View  Result Date: 10/11/2015 CLINICAL DATA:  Fever. EXAM: CHEST  2 VIEW COMPARISON:  PET of 08/05/2015. FINDINGS: No prior plain film. Lateral view degraded by patient arm position. Artifact degraded lateral view posteriorly. Mild right hemidiaphragm elevation. Midline trachea. Normal heart size.  Atherosclerosis in the transverse aorta. No pleural effusion or pneumothorax. Clear lungs. IMPRESSION: No acute process or explanation for fever. Aortic atherosclerosis. Electronically Signed   By: Abigail Miyamoto M.D.   On: 10/11/2015 17:59   US Transvaginal Non-ob  Result Date: 10/12/2015 CLINICAL DATA:  Vaginal bleeding. History of cervical cancer status post radiation therapy. EXAM: TRANSABDOMINAL AND TRANSVAGINAL ULTRASOUND OF PELVIS TECHNIQUE: Both transabdominal and transvaginal ultrasound examinations of the pelvis were performed. Transabdominal technique was performed for global imaging of the pelvis including uterus, ovaries, adnexal regions, and pelvic cul-de-sac. It was necessary to proceed with endovaginal exam following the transabdominal exam to visualize the endometrium. COMPARISON:  CT abdomen and pelvis earlier today FINDINGS: Uterus Poorly visualized and only seen transabdominally. Due to patient pain and guarding, the uterus could not be visualized transvaginally and the patient could not tolerate any additional imaging attempts. The uterus measures approximately 9.1 cm in length and 4.1 cm in AP thickness. Endometrium Unable to be evaluated. Right ovary Not visualized. Left ovary Not visualized. Other findings The bladder is diffusely thick-walled as seen on earlier CT and contains complex fluid throughout. There is masslike soft tissue in the posterior aspect of the bladder without internal vascularity demonstrated on color Doppler imaging. IMPRESSION: 1. Limited examination as above with poor visualization of the uterus. 2. Thick-walled bladder containing complex fluid as well as masslike soft tissue posteriorly. This is concerning for neoplasm although hematoma could potentially give a similar appearance. Cystoscopy may be helpful for further assessment. Electronically Signed   By: Logan Bores M.D.   On: 10/12/2015 00:37   US Pelvis Complete  Result Date: 10/12/2015 CLINICAL DATA:   Vaginal bleeding. History of cervical cancer status post radiation therapy. EXAM: TRANSABDOMINAL AND TRANSVAGINAL ULTRASOUND OF PELVIS TECHNIQUE: Both transabdominal and transvaginal ultrasound examinations of the pelvis were performed. Transabdominal technique was performed for global imaging of the pelvis including uterus, ovaries, adnexal regions, and pelvic cul-de-sac. It was necessary to proceed with endovaginal exam following the transabdominal exam to visualize the endometrium. COMPARISON:  CT abdomen and pelvis earlier today FINDINGS: Uterus Poorly visualized and only seen transabdominally. Due to patient pain and guarding, the uterus could not be visualized transvaginally and the patient could not tolerate any additional imaging attempts. The uterus measures approximately 9.1 cm in length and 4.1 cm in AP thickness. Endometrium Unable to be evaluated. Right ovary Not visualized. Left ovary Not visualized. Other findings The bladder is diffusely thick-walled as seen on earlier  CT and contains complex fluid throughout. There is masslike soft tissue in the posterior aspect of the bladder without internal vascularity demonstrated on color Doppler imaging. IMPRESSION: 1. Limited examination as above with poor visualization of the uterus. 2. Thick-walled bladder containing complex fluid as well as masslike soft tissue posteriorly. This is concerning for neoplasm although hematoma could potentially give a similar appearance. Cystoscopy may be helpful for further assessment. Electronically Signed   By: Logan Bores M.D.   On: 10/12/2015 00:37        Scheduled Meds: . cefTAZidime (FORTAZ)  IV  2 g Intravenous Q24H  . feeding supplement (GLUCERNA SHAKE)  237 mL Oral BID BM  . insulin aspart  0-9 Units Subcutaneous TID WC  . insulin detemir  5 Units Subcutaneous QHS  . pantoprazole  40 mg Oral Daily   Continuous Infusions: . sodium chloride 1,000 mL (10/13/15 0901)     LOS: 2 days    Time spent:  25 minutes.     Elmarie Shiley, MD [facility] Pager [phone]  If 7PM-7AM, please contact night-coverage [email or web address] Password [email or web address] 10/13/2015, 2:13 PM

## 2015-10-14 LAB — GLUCOSE, CAPILLARY
GLUCOSE-CAPILLARY: 195 mg/dL — AB (ref 65–99)
GLUCOSE-CAPILLARY: 113 mg/dL — AB (ref 65–99)
GLUCOSE-CAPILLARY: 191 mg/dL — AB (ref 65–99)
GLUCOSE-CAPILLARY: 169 mg/dL — AB (ref 65–99)
Glucose-Capillary: 173 mg/dL — ABNORMAL HIGH (ref 65–99)

## 2015-10-14 LAB — CBC
HCT: 29.8 % — ABNORMAL LOW (ref 36.0–46.0)
Hemoglobin: 9.8 g/dL — ABNORMAL LOW (ref 12.0–15.0)
MCH: 29.8 pg (ref 26.0–34.0)
MCHC: 32.9 g/dL (ref 30.0–36.0)
MCV: 90.6 fL (ref 78.0–100.0)
PLATELETS: 244 10*3/uL (ref 150–400)
RBC: 3.29 MIL/uL — ABNORMAL LOW (ref 3.87–5.11)
RDW: 15 % (ref 11.5–15.5)
WBC: 13.3 10*3/uL — AB (ref 4.0–10.5)

## 2015-10-14 LAB — BASIC METABOLIC PANEL
ANION GAP: 5 (ref 5–15)
BUN: 18 mg/dL (ref 6–20)
CALCIUM: 8.3 mg/dL — AB (ref 8.9–10.3)
CO2: 18 mmol/L — ABNORMAL LOW (ref 22–32)
CREATININE: 1.85 mg/dL — AB (ref 0.44–1.00)
Chloride: 113 mmol/L — ABNORMAL HIGH (ref 101–111)
GFR, EST AFRICAN AMERICAN: 29 mL/min — AB (ref >60)
GFR, EST NON AFRICAN AMERICAN: 25 mL/min — AB (ref >60)
Glucose, Bld: 111 mg/dL — ABNORMAL HIGH (ref 65–99)
Potassium: 3.8 mmol/L (ref 3.5–5.1)
SODIUM: 136 mmol/L (ref 135–145)

## 2015-10-14 LAB — TYPE AND SCREEN
ABO/RH(D): O POS
ANTIBODY SCREEN: NEGATIVE
UNIT DIVISION: 0
UNIT DIVISION: 0

## 2015-10-14 MED ORDER — SENNOSIDES-DOCUSATE SODIUM 8.6-50 MG PO TABS
1.0000 | ORAL_TABLET | Freq: Two times a day (BID) | ORAL | Status: DC
  Administered 2015-10-14 – 2015-10-18 (×9): 1 via ORAL
  Filled 2015-10-14 (×9): qty 1

## 2015-10-14 MED ORDER — POLYETHYLENE GLYCOL 3350 17 G PO PACK
17.0000 g | PACK | Freq: Two times a day (BID) | ORAL | Status: DC
  Administered 2015-10-14 – 2015-10-18 (×9): 17 g via ORAL
  Filled 2015-10-14 (×9): qty 1

## 2015-10-14 NOTE — Consult Note (Addendum)
WOC consult requested for urostomy; patient does not have a urostomy but has bilat nephrostomy tubes. Pt is followed by urology for assessment and plan of care, according to the EMR.  Tubes should be emptied PRN when drainage bags are full and dry gauze and tape applied to tube insertion sites Q day. No role for WOC nurse; please refer to urology team for further questions. Please re-consult if further assistance is needed.  Thank-you,  Julien Girt MSN, [place], [place], [place], [place]

## 2015-10-14 NOTE — Progress Notes (Signed)
PROGRESS NOTE    Heidi [name]  [id number] DOB: October 17, 1940 DOA: 10/11/2015 PCP: No PCP Per Patient   Brief Narrative: Heidi [name] is a 75 y.o. 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 Patient reports lower abdominal pain and difficulty urinating and defecating. Patient with fever up to 101F, WBC of 19.4, tachycardia, tachypnea, and UA positive for signs of a UTI. Treating as sepsis secondary to urinary tract infection risk factors - Follow up blood/urine cultures growing proteus  sensitive to ceftriaxone.  - Change ceftazidine to ceftriaxone.  - Tylenol prn fever - Hydrocodone prn pain -wbc trending up. Continue with fluids and IV antibiotics.  -Foley catheter placed, recommended by urology After foley [name], patient drain white purulent materia.  WBC trending down.  Continue with IV ceftriaxone. Discharge on cipro.   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 Most likely following patient's history of uterine cancer and radiation. Nephrostomy tubes reported to be last changed on 6/22. - ostomy care consult -IR evaluated patient, nephrostomy functioning well.   Question vesicoureteral fistula/ Question Urethral vs. Vaginal bleeding: CT found possible  fistula formation, right inguinal lymphadenopathy. Family also gives report of intermittent bleeding when wiping self after urinating that could be coming from the bladder, or ureters but would want to make sure. Especially in the setting of recent weight loss and previous CT in 06/2015 noting mildly avid nodes and small focus within the endometrial cavity. Although certain findings could be secondary to the infection as seen above. Patient has been seen by Dr. Denman George of OB/GYN and previously by Dr. Tresa Moore of Urology.  - Transvaginal U/S to evaluate the endometrium showed mass vs hematoma  -urology consulted. Appreciate Dr Patsy Baltimore help. Ultrasound finding hematoma vs thickening of bladder. Recent cystoscopy in the office negative for mass.  -oncology consulted. Await Dr Denman George recommendation   AKI; on chronic stage iv  Creatinine on admission 2.11 with a BUN of 33; which appears overall similar to last value obtained in March of 1.9. Elevated BUN could suggest some signs of dehydration in collaboration with history of decreased po intake. - IV fluids -cr stable. Follow trend.  -foley catheter inserted.  Renal function improved.   Hyperglycemia with Diabetes mellitus type 2: Acute. Glucose elevated at 323 on admission likely secondary to underlying infection. Patient currently on only oral medications at this time. Patient's last hemoglobin A1c is unknown at this time. - Hypoglycemic protocol - Held glyburide-metformin  -hold levemir due to hypoglycemia this am.  - likely need to discontinue metformin at discharge due to renal; failure.   Anemia: Hemoglobin previously between 9 and 10, but on presentation 8.4. The patient notes the vaginal bleeding with wiping after urination. Patient also found to have blood on urinalysis. Question exact source of this time. - T&S for possible need of blood products - hb trending down, transfuse 2 unit 7-23. Hb stable.    Hyponatremia: Sodium 129  admission.  Patient still hyponatremic even after correction for hyperglycemia. - improved with IV fluids.   Hypoalbuminemia: Albumin 2.3 on admission.  - Check pre-albumin 7.8 - Glucerna shakes with meals   DVT prophylaxis: scd.  Code Status: full code. Discussed with patient Family Communication: daughter at bedside.  Disposition Plan: remain inpatient for IV antibiotics.    Consultants:   Urology    Procedures:   none  Antimicrobials:   cefta 7-22---7-23   ceftriaxone 7-23   Subjective: Pain better. She is now constipated.   Objective: Vitals:   10/13/15 1755 10/13/15 2301 10/14/15 0451 10/14/15 1400  BP: (!) 141/52 (!) 122/51 (!) 127/53 (!) 142/57  Pulse: 93 92 84 85  Resp: 18 18 18 19   Temp: 99.2 F (37.3 C) 99.1 F (37.3 C) 98.2 F (36.8 C) 98.7 F (37.1 C)  TempSrc: Axillary  Oral   SpO2: 100% 99% 98% 99%  Weight:      Height:        Intake/Output Summary (Last 24 hours) at 10/14/15 1508 Last data filed at 10/14/15 1124  Gross per 24 hour  Intake          2904.33 ml  Output             3905 ml  Net         -1000.67 ml   Filed Weights   10/11/15 2153  Weight: 46.8 kg (103 lb 3.2 oz)    Examination:  General exam: Appears calm and comfortable  Respiratory system: Clear to auscultation. Respiratory effort normal. Cardiovascular system: S1 & S2 heard, RRR. No JVD, murmurs, rubs, gallops or clicks. No pedal edema. Gastrointestinal system: Abdomen is nondistended, soft and supra-pubic tenderness. No organomegaly or masses felt. Normal bowel sounds heard. Supra-pubic tenderness, nephrostomy draining clear urine  Central nervous system: Alert and oriented. No focal neurological deficits. Extremities: Symmetric 5 x 5 power. Skin: No rashes, lesions or ulcers Psychiatry: Judgement and insight appear normal. Mood & affect appropriate.     Data Reviewed: I have personally reviewed following labs and imaging studies  CBC:  Recent Labs Lab 10/11/15 1425  10/12/15 0407 10/13/15 0509 10/14/15 0546  WBC 19.2* 19.7* 21.4* 13.3*  HGB 8.4* 8.0* 7.7* 9.8*  HCT 25.9* 24.0* 24.3* 29.8*  MCV 93.2 92.3 94.2 90.6  PLT 294 257 261 [id number]   Basic Metabolic Panel:  Recent Labs Lab 10/11/15 1425 10/12/15 0407 10/13/15 0509 10/14/15 0546  NA 129* 135 135 136  K 4.8 4.1 3.8 3.8  CL 102 111 110 113*  CO2 19* 19* 19* 18*  GLUCOSE 323* 126* 66 111*  BUN 33* 29* 24* 18  CREATININE 2.11* 2.32* 2.21* 1.85*  CALCIUM 8.6* 8.5* 8.4* 8.3*   GFR: Estimated Creatinine Clearance: 19.1 mL/min (by C-G formula based on SCr of 1.85 mg/dL). Liver Function Tests:  Recent Labs Lab 10/11/15 1425  AST 20  ALT 19  ALKPHOS 121  BILITOT 0.3  PROT 7.1  ALBUMIN 2.3*   No results for input(s): LIPASE, AMYLASE in the last 168 hours. No results for input(s): AMMONIA in the last 168 hours. Coagulation Profile:  Recent Labs Lab 10/11/15 2212  INR 1.43   Cardiac Enzymes: No results for input(s): CKTOTAL, CKMB, CKMBINDEX, TROPONINI in the last 168 hours. BNP (last 3 results) No results for input(s): PROBNP in the last 8760 hours. HbA1C: No results for input(s): HGBA1C in the last 72 hours. CBG:  Recent Labs Lab 10/13/15 1201 10/13/15 1656 10/13/15 2259 10/14/15 [id number]  10/14/15 1154  GLUCAP 116* 273* 195* 113* 191*   Lipid Profile: No results for input(s): CHOL, HDL, LDLCALC, TRIG, CHOLHDL, LDLDIRECT in the last 72 hours. Thyroid Function Tests: No results for input(s): TSH, T4TOTAL, FREET4, T3FREE, THYROIDAB in the last 72 hours. Anemia Panel: No results for input(s): VITAMINB12, FOLATE, FERRITIN, TIBC, IRON, RETICCTPCT in the last 72 hours. Sepsis Labs:  Recent Labs Lab 10/11/15 1602 10/11/15 2046  LATICACIDVEN 1.61 0.63    Recent Results (from the past 240 hour(s))  Blood Culture (routine x 2)     Status: None (Preliminary result)   Collection Time: 10/11/15  3:56 PM  Result Value Ref Range Status   Specimen Description BLOOD RIGHT  FOREARM  Final   Special Requests IN PEDIATRIC BOTTLE 4CC  Final   Culture NO GROWTH 3 DAYS  Final   Report Status PENDING  Incomplete  Urine culture     Status: Abnormal   Collection Time: 10/11/15  4:16 PM  Result Value Ref Range Status   Specimen Description URINE, CATHETERIZED  Final   Special Requests NONE  Final   Culture >=100,000 COLONIES/mL PROTEUS MIRABILIS (A)  Final   Report Status 10/13/2015 FINAL  Final   Organism ID, Bacteria PROTEUS MIRABILIS (A)  Final      Susceptibility   Proteus mirabilis - MIC*    AMPICILLIN <=2 SENSITIVE Sensitive     CEFAZOLIN <=4 SENSITIVE Sensitive     CEFTRIAXONE <=1 SENSITIVE Sensitive     CIPROFLOXACIN <=0.25 SENSITIVE Sensitive     GENTAMICIN <=1 SENSITIVE Sensitive     IMIPENEM 4 SENSITIVE Sensitive     NITROFURANTOIN 128 RESISTANT Resistant     TRIMETH/SULFA <=20 SENSITIVE Sensitive     AMPICILLIN/SULBACTAM <=2 SENSITIVE Sensitive     PIP/TAZO <=4 SENSITIVE Sensitive     * >=100,000 COLONIES/mL PROTEUS MIRABILIS  Blood Culture (routine x 2)     Status: None (Preliminary result)   Collection Time: 10/11/15  4:16 PM  Result Value Ref Range Status   Specimen Description BLOOD LEFT FOREARM  Final   Special Requests BOTTLES DRAWN AEROBIC AND ANAEROBIC 5CC  Final   Culture NO GROWTH 3 DAYS  Final   Report Status PENDING  Incomplete         Radiology Studies: No results found.      Scheduled Meds: . cefTRIAXone (ROCEPHIN)  IV  1 g Intravenous Q24H  . feeding supplement (GLUCERNA SHAKE)  237 mL Oral BID BM  . insulin aspart  0-9 Units Subcutaneous TID WC  . pantoprazole  40 mg Oral Daily  . polyethylene glycol  17 g Oral BID  . senna-docusate  1 tablet Oral BID   Continuous Infusions: . sodium chloride 100 mL/hr at 10/14/15 0454     LOS: 3 days    Time spent: 25 minutes.     Elmarie Shiley, MD [facility] Pager [phone]  If 7PM-7AM, please contact night-coverage [email or web address] Password  [facility] 10/14/2015, 3:08 PM

## 2015-10-15 DIAGNOSIS — A419 Sepsis, unspecified organism: Secondary | ICD-10-CM

## 2015-10-15 DIAGNOSIS — N390 Urinary tract infection, site not specified: Secondary | ICD-10-CM

## 2015-10-15 LAB — BASIC METABOLIC PANEL
Anion gap: 6 (ref 5–15)
BUN: 17 mg/dL (ref 6–20)
CO2: 20 mmol/L — ABNORMAL LOW (ref 22–32)
CREATININE: 1.7 mg/dL — AB (ref 0.44–1.00)
Calcium: 8.3 mg/dL — ABNORMAL LOW (ref 8.9–10.3)
Chloride: 112 mmol/L — ABNORMAL HIGH (ref 101–111)
GFR, EST AFRICAN AMERICAN: 33 mL/min — AB (ref >60)
GFR, EST NON AFRICAN AMERICAN: 28 mL/min — AB (ref >60)
Glucose, Bld: 164 mg/dL — ABNORMAL HIGH (ref 65–99)
POTASSIUM: 4.3 mmol/L (ref 3.5–5.1)
SODIUM: 138 mmol/L (ref 135–145)

## 2015-10-15 LAB — CBC
HCT: 31.6 % — ABNORMAL LOW (ref 36.0–46.0)
HEMOGLOBIN: 10.5 g/dL — AB (ref 12.0–15.0)
MCH: 30.4 pg (ref 26.0–34.0)
MCHC: 33.2 g/dL (ref 30.0–36.0)
MCV: 91.6 fL (ref 78.0–100.0)
PLATELETS: 282 10*3/uL (ref 150–400)
RBC: 3.45 MIL/uL — AB (ref 3.87–5.11)
RDW: 14.7 % (ref 11.5–15.5)
WBC: 10.8 10*3/uL — ABNORMAL HIGH (ref 4.0–10.5)

## 2015-10-15 LAB — GLUCOSE, CAPILLARY
GLUCOSE-CAPILLARY: 157 mg/dL — AB (ref 65–99)
GLUCOSE-CAPILLARY: 161 mg/dL — AB (ref 65–99)
GLUCOSE-CAPILLARY: 187 mg/dL — AB (ref 65–99)
Glucose-Capillary: 157 mg/dL — ABNORMAL HIGH (ref 65–99)

## 2015-10-15 MED ORDER — AMLODIPINE BESYLATE 2.5 MG PO TABS
2.5000 mg | ORAL_TABLET | Freq: Every day | ORAL | Status: DC
  Administered 2015-10-15 – 2015-10-18 (×4): 2.5 mg via ORAL
  Filled 2015-10-15 (×4): qty 1

## 2015-10-15 NOTE — Consult Note (Signed)
Patient ID: Heidi [name], female   DOB: 11-11-1940, 75 y.o.   MRN: [id number] Reason for Consult: Cervical Cancer Referring Physician: Niel Hummer, MD  Heidi [name] Heidi [name] is an 75 y.o. female.  HPI: She carries a diagnosis of cervical cancer and is admitted with urosepsis.  A CT scan of the abdomen/pelvis is suggestive of a vesicovaginal fistula and right inguinal lymphadenopathy.  She has a history of urinary incontinence which is stable at present.  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 She presented to the [facility] ED with a several day history of fever, suprapubic pain and anorexia. She was admitted and started on Ceftazidine. A urine C&S grew out Proteus.  Blood cultures have been negative to date.   Interval history: She reports a remote (2003) history of cervical cancer that was treated with primary chemoradiation at [facility] [place]. She had an ex lap as part of workup of her cancer, but hysterectomy was not performed. She had a complete response to primary therapy and did attend surveillance visits for many years and had no evidence of recurrence.  However, she developed abdominal pain and symptoms of urosepsis in April, 2017 and was admitted to [facility] on 07/20/15. Upon admission her creatinine was noted to be very elevated (3.37). She had findings of Klebsiella pneumoniae bacteriuria and was treated with antibiotics. She underwent imaging of the abdomen and pelvis (non contrasted CT) which showed: Bilateral hydronephrosis, moderate-to-severe in degree, favored to be chronic given the lack of significant perinephric inflammation. Poorly defined soft tissue mass/thickening within the pelvis, surrounding the bladder and within the bilateral adnexal regions, difficult to definitively characterize without intravascular contrast. This poorly defined soft  tissue mass/thickening is the likely source of the bilateral obstructive uropathy. This may represent local spread of neoplastic mass related to the given history of endometrial cancer. Alternatively, this could represent postsurgical and/or post-treatment changes (scarring/fibrosis). As above, there is soft tissue mass/thickening circumferentially about the bladder. This may represent bladder wall thickening or an adjacent soft tissue encasement. Surgical clips are noted along the bilateral pelvic sidewalls.   She underwent bilateral percutaneous nephrostomy tube placement on 07/23/15 and received a consultation with Urologist, Dr Tresa Moore. Her creatinine trended down after placement of the percutaneous nephrostomy tubes.   On May 8th a pap was taken that was unremarkable and her pelvic exam revealed a shortened vagina with no apparent pelvic masses.  On 08/05/15 PET/CT was performed and revealed: minimal hypermetabolism within left periaortic and right inguinal lymph nodes, nonspecific and possibly reactive. Attention on followup exams is warranted. Mild vague hypermetabolism along the left vaginal cuff without a definite CT correlate. Findings may be treatment related..      Past Medical History:  Diagnosis Date  . Diabetes mellitus without complication ([place])   . Uterine cancer ([place])     History reviewed. No pertinent surgical history.  No family history on file.  Social History:  reports that she has never smoked. She does not have any smokeless tobacco history on file. She reports that she does not drink alcohol or use drugs.  Allergies:       Allergies  Allergen Reactions  . Penicillins Itching, Nausea And Vomiting and Rash    Has patient had a PCN reaction causing immediate rash, facial/tongue/throat swelling, SOB or lightheadedness with hypotension: Yes Has patient had a PCN reaction causing severe rash involving mucus membranes or skin necrosis: No  Has patient had a PCN  reaction that required hospitalization No Has patient had a PCN reaction occurring within the last 10 years: No If all of the above answers are "NO", then may proceed with Cephalosporin use.    Medications: I have reviewed the patient's current medications.  CBC    Component Value Date/Time   WBC 10.8 (H) 10/15/2015 0735   RBC 3.45 (L) 10/15/2015 0735   HGB 10.5 (L) 10/15/2015 0735   HCT 31.6 (L) 10/15/2015 0735   PLT 282 10/15/2015 0735   MCV 91.6 10/15/2015 0735   MCH 30.4 10/15/2015 0735   MCHC 33.2 10/15/2015 0735   RDW 14.7 10/15/2015 0735   LYMPHSABS 1.6 07/20/2015 1735   MONOABS 0.7 07/20/2015 1735   EOSABS 0.2 07/20/2015 1735   BASOSABS 0.0 07/20/2015 1735   BMET    Component Value Date/Time   NA 138 10/15/2015 0735   NA 133 (L) 07/29/2015 1624   K 4.3 10/15/2015 0735   K 4.5 07/29/2015 1624   CL 112 (H) 10/15/2015 0735   CO2 20 (L) 10/15/2015 0735   CO2 24 07/29/2015 1624   GLUCOSE 164 (H) 10/15/2015 0735   GLUCOSE 105 07/29/2015 1624   BUN 17 10/15/2015 0735   BUN 39.8 (H) 07/29/2015 1624   CREATININE 1.70 (H) 10/15/2015 0735   CREATININE 1.9 (H) 07/29/2015 1624   CALCIUM 8.3 (L) 10/15/2015 0735   CALCIUM 9.4 07/29/2015 1624   GFRNONAA 28 (L) 10/15/2015 0735   GFRAA 33 (L) 10/15/2015 0735     Imaging Results (Last 48 hours)  Ct Abdomen Pelvis Wo Contrast  Result Date: 10/11/2015 CLINICAL DATA:  Fever. Pelvic pain. History of cervical cancer. Bilateral hydronephrosis. EXAM: CT ABDOMEN AND PELVIS WITHOUT CONTRAST TECHNIQUE: Multidetector CT imaging of the abdomen and pelvis was performed following the standard protocol without IV contrast. COMPARISON:  PET-CT 08/05/2015.  CT abdomen and pelvis 07/21/2015. FINDINGS: Minimal atelectasis is present in the lung bases. There is no pleural effusion. A punctate calcification in the right hepatic lobe is unchanged. The liver is enlarged, measuring 21 cm in length. The multiple gallstones are again seen. There is  no evidence of significant biliary dilatation. The spleen, adrenal glands, and pancreas have an unremarkable unenhanced appearance. Bilateral nephrostomy tubes remain in place. There is moderate to severe bilateral hydronephrosis which has increased from the prior PET-CT. Both ureters are also mildly to moderately dilated diffusely. There is no evidence of bowel obstruction. Diffuse aortoiliac atherosclerosis is noted. Subcentimeter para-aortic lymph nodes are similar to the prior PET-CT. A 1.5 cm right inguinal lymph node is incompletely visualized but has enlarged (series 201, image 84, previously 1.1 cm). Adjacent subcentimeter right inguinal lymph nodes are also slightly larger. Surgical clips are again noted bilaterally in the pelvis. The bladder is mildly distended with moderate diffuse bladder wall thickening. There is new distension of the endometrial canal by low-density fluid which appears to extend anteriorly through a defect in the uterine wall and may communicate with the posterior bladder. No extraluminal fluid collection/abscess is identified in the pelvis. Soft tissue thickening is again seen in the pelvis likely reflecting the sequelae of prior radiation therapy. No free fluid is identified. No suspicious lytic blastic osseous lesions are identified. Grade 1 anterolisthesis of L4 on L5 is unchanged. IMPRESSION: 1. Increased, moderate to severe bilateral hydronephrosis with nephrostomy tubes in place. 2. Diffuse bladder wall thickening which may reflect urinary tract infection or be radiation induced. 3. New fluid in the endometrial canal which  extends through a defect in the anterior uterine wall and is suspicious for vesicouterine fistula. 4. New right inguinal lymphadenopathy which may be reactive in the setting of infection. Electronically Signed   By: Logan Bores M.D.   On: 10/11/2015 18:54   Dg Chest 2 View  Result Date: 10/11/2015 CLINICAL DATA:  Fever. EXAM: CHEST  2 VIEW COMPARISON:   PET of 08/05/2015. FINDINGS: No prior plain film. Lateral view degraded by patient arm position. Artifact degraded lateral view posteriorly. Mild right hemidiaphragm elevation. Midline trachea. Normal heart size. Atherosclerosis in the transverse aorta. No pleural effusion or pneumothorax. Clear lungs. IMPRESSION: No acute process or explanation for fever. Aortic atherosclerosis. Electronically Signed   By: Abigail Miyamoto M.D.   On: 10/11/2015 17:59   US Transvaginal Non-ob  Result Date: 10/12/2015 CLINICAL DATA:  Vaginal bleeding. History of cervical cancer status post radiation therapy. EXAM: TRANSABDOMINAL AND TRANSVAGINAL ULTRASOUND OF PELVIS TECHNIQUE: Both transabdominal and transvaginal ultrasound examinations of the pelvis were performed. Transabdominal technique was performed for global imaging of the pelvis including uterus, ovaries, adnexal regions, and pelvic cul-de-sac. It was necessary to proceed with endovaginal exam following the transabdominal exam to visualize the endometrium. COMPARISON:  CT abdomen and pelvis earlier today FINDINGS: Uterus Poorly visualized and only seen transabdominally. Due to patient pain and guarding, the uterus could not be visualized transvaginally and the patient could not tolerate any additional imaging attempts. The uterus measures approximately 9.1 cm in length and 4.1 cm in AP thickness. Endometrium Unable to be evaluated. Right ovary Not visualized. Left ovary Not visualized. Other findings The bladder is diffusely thick-walled as seen on earlier CT and contains complex fluid throughout. There is masslike soft tissue in the posterior aspect of the bladder without internal vascularity demonstrated on color Doppler imaging. IMPRESSION: 1. Limited examination as above with poor visualization of the uterus. 2. Thick-walled bladder containing complex fluid as well as masslike soft tissue posteriorly. This is concerning for neoplasm although hematoma could potentially  give a similar appearance. Cystoscopy may be helpful for further assessment. Electronically Signed   By: Logan Bores M.D.   On: 10/12/2015 00:37   US Pelvis Complete  Result Date: 10/12/2015 CLINICAL DATA:  Vaginal bleeding. History of cervical cancer status post radiation therapy. EXAM: TRANSABDOMINAL AND TRANSVAGINAL ULTRASOUND OF PELVIS TECHNIQUE: Both transabdominal and transvaginal ultrasound examinations of the pelvis were performed. Transabdominal technique was performed for global imaging of the pelvis including uterus, ovaries, adnexal regions, and pelvic cul-de-sac. It was necessary to proceed with endovaginal exam following the transabdominal exam to visualize the endometrium. COMPARISON:  CT abdomen and pelvis earlier today FINDINGS: Uterus Poorly visualized and only seen transabdominally. Due to patient pain and guarding, the uterus could not be visualized transvaginally and the patient could not tolerate any additional imaging attempts. The uterus measures approximately 9.1 cm in length and 4.1 cm in AP thickness. Endometrium Unable to be evaluated. Right ovary Not visualized. Left ovary Not visualized. Other findings The bladder is diffusely thick-walled as seen on earlier CT and contains complex fluid throughout. There is masslike soft tissue in the posterior aspect of the bladder without internal vascularity demonstrated on color Doppler imaging. IMPRESSION: 1. Limited examination as above with poor visualization of the uterus. 2. Thick-walled bladder containing complex fluid as well as masslike soft tissue posteriorly. This is concerning for neoplasm although hematoma could potentially give a similar appearance. Cystoscopy may be helpful for further assessment. Electronically Signed   By: Seymour Bars.D.  On: 10/12/2015 00:37     Review of Systems  Constitutional: Positive for fever. Negative for weight loss.  Gastrointestinal: Positive for abdominal pain. Negative for nausea and  vomiting.   BP (!) 165/59 (BP Location: Left Arm)   Pulse 75   Temp 98.1 F (36.7 C)   Resp 18   Ht 5\' 2"  (1.575 m)   Wt 103 lb 3.2 oz (46.8 kg)   SpO2 98%   BMI 18.88 kg/m   Physical Exam  Constitutional: She is oriented to person, place, and time.  Thin  HENT:  Head: Normocephalic and atraumatic.  Neck: Neck supple.  Cardiovascular: Regular rhythm.   GI: She exhibits no distension. There is no tenderness.  Musculoskeletal: She exhibits no edema or tenderness.  Neurological: She is alert and oriented to person, place, and time.  Groins: minimally palpable, slightly prominent right inguinal node.   Assessment/Plan: Principal Problem:   Sepsis due to urinary tract infection (HCC) Right inguinal lymphadenopathy. Active Problems:   Obstructive uropathy   History of cervical cancer   Renal insufficiency   Hyponatremia   Hypoalbuminemia   Vesicouterine fistula   Diabetes mellitus, type 2 (HCC)  UTI (proteus) with sepsis: Clinically stable from an infectious standpoint  Vesicovaginal fistula: not symptomatic with foley and PCN tubes. Not a candidate for a surgical diversion procedure. Continue to manage with foley and PCN's or according to Urology's recommendations. This fistula may be secondary to either radiation necrosis vs recurrence of her cervical cancer (see below).  Lymphadenopathy right groin: may represent recurrence. Recommend biopsy. If positive, patient would be a candidate for palliative intent salvage chemotherapy, however she has expressed a desire to have no further treatment.  Follow-up: I have followup scheduled with Zoua in the office at the [facility] for August 15th. We will discuss plans further at this time.   Donaciano Eva, MD  [phone]

## 2015-10-15 NOTE — Progress Notes (Signed)
Subjective:  1 - Bilateral Hydronephrosis / Stage 3 Renal Insufficiency - bilateral mod-severe hydro to deep pelvis / mass area by CT x several 2017.. Bilat neph tubes placed initially 07/2015 and recnetly changed. CR now 1.5-2's.   2 - Cervical Cancer / New Inguinal Adenopathy - h/o XRT in [place] for advanced endometrial cancer. Worsening ingiunal adenopathy by imaging this admission.   3 - Febrile UTI - fevers, malaise, bacteruria on presentation, UCX proteus sens all but macrobid. [place]. Now afebrile. This is mostly improved after neph tube flushing and bladder drainage. Known hydro as per above.   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 Fistula by CT this admission. Prior cysto in [facility] office w/o posterior bladder edema. Now with no vaginal output of urine with neph tubes and foley in place.    She lives in [place] and is in [place] visitin family for past few mos. Her graddaugher Heidi [name] is very involved with her care and lives in [place] and is reacahble at [phone]. [name]  Today "Heidi [name]" is seen in f/u above.   Objective: Vital signs in last 24 hours: Temp:  [98 F (36.7 C)-98.1 F (36.7 C)] 98.1 F (36.7 C) (07/25 1447) Pulse Rate:  [70-85] 75 (07/25 1735) Resp:  [18] 18 (07/25 1447) BP: (145-165)/(58-72) 165/59 (07/25 1735) SpO2:  [98 %-99 %] 98 % (07/25 1447) Last BM Date: 10/13/15  Intake/Output from previous day: 07/24 0701 - 07/25 0700 In: 1393.3 [P.O.:280; I.V.:1113.3] Out: 4680 [Urine:4680] Intake/Output this shift: No intake/output data recorded.  NAD, at baseline. Daughter at bedside.  RRR Non-labored breathing Thin non-tender abdomen.  Bilat neph tubes with clear yellow urine Foley with clear yellow urine. Vaginal vault w/o gross urine. Very firm / fixed superior border. No c/c/e  Lab Results:   Recent Labs  10/14/15 0546 10/15/15 0735  WBC 13.3* 10.8*  HGB 9.8* 10.5*  HCT 29.8*  31.6*  PLT 244 282   BMET  Recent Labs  10/14/15 0546 10/15/15 0735  NA 136 138  K 3.8 4.3  CL 113* 112*  CO2 18* 20*  GLUCOSE 111* 164*  BUN 18 17  CREATININE 1.85* 1.70*  CALCIUM 8.3* 8.3*   PT/INR No results for input(s): LABPROT, INR in the last 72 hours. ABG No results for input(s): PHART, HCO3 in the last 72 hours.  Invalid input(s): PCO2, PO2  Studies/Results: No results found.  Anti-infectives: Anti-infectives    Start     Dose/Rate Route Frequency Ordered Stop   10/13/15 1500  cefTRIAXone (ROCEPHIN) 1 g in dextrose 5 % 50 mL IVPB     1 g 100 mL/hr over 30 Minutes Intravenous Every 24 hours 10/13/15 1420     10/11/15 2200  ceFEPIme (MAXIPIME) 1 g in dextrose 5 % 50 mL IVPB  Status:  Discontinued     1 g 100 mL/hr over 30 Minutes Intravenous Every 12 hours 10/11/15 2059 10/11/15 2106   10/11/15 2130  cefTAZidime (FORTAZ) 2 g in dextrose 5 % 50 mL IVPB  Status:  Discontinued     2 g 100 mL/hr over 30 Minutes Intravenous Every 24 hours 10/11/15 2107 10/13/15 1420   10/11/15 1600  cefTRIAXone (ROCEPHIN) 1 g in dextrose 5 % 50 mL IVPB  Status:  Discontinued     1 g 100 mL/hr over 30 Minutes Intravenous Every 24 hours 10/11/15 1528 10/11/15 2107   10/11/15 1530  cefTRIAXone (ROCEPHIN) 2 g in dextrose 5 % 50  mL IVPB  Status:  Discontinued     2 g 100 mL/hr over 30 Minutes Intravenous  Once 10/11/15 1525 10/11/15 1528      Assessment/Plan:  1 - Bilateral Hydronephrosis / Stage 3 Renal Insufficiency -improved with neph tubes. This is likely radiation induced or malignant obstruction. Continue chronic neph tubes.   2 - Cervical Cancer / New Inguinal Adenopathy - I feel US guided BX of inguinal nodes warranted as if recurrent cancer this would greatly affect treatment / prognosis / counseling. NPO p MN and reqeust made for US guided BX.   3 - Febrile UTI - improving by systemic paramaters on CX-specific therapy. Would rec transition to PO course with 2 weeks  total treatment for discharge.   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 Malignant. Will try to arrange operative cysto / BX / cystogram this Thursday or Friday if possible with goal of ruling out local cancer recurence and confirming / excluding fistula.  This will need to be performed as [place], but pt can likley be performed with [facility] transport to / from [facility]. I will have our scheduled work on these logistics.   Please call me directly with questions anytime.  [facility], Itay Mella 10/15/2015

## 2015-10-15 NOTE — Progress Notes (Signed)
PROGRESS NOTE    Heidi [name]  [id number] DOB: 03-31-1940 DOA: 10/11/2015 PCP: No PCP Per Patient   Brief Narrative: Heidi [name] is a 75 y.o. 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  Presents with sepsis, secondary to UTI. Also with acute on chronic renal failure secondary to infection. CT scan with possible uterine-vesical fistula formation. Urology and GYN oncology consulted.    Assessment & Plan:   Principal Problem:   Sepsis due to urinary tract infection ([place]) Active Problems:   Obstructive uropathy   History of cervical cancer   Renal insufficiency   Hyponatremia   Hypoalbuminemia   Vesicouterine fistula   Diabetes mellitus, type 2 (HCC)   Sepsis secondary to urinary tract infection: Acute. Patient reports lower abdominal pain and difficulty urinating and defecating. Patient with fever up to 101F, WBC of 19.4, tachycardia, tachypnea, and UA positive for signs of a UTI. Treating as sepsis secondary to urinary tract infection risk factors - Follow up blood/urine cultures growing proteus  sensitive to ceftriaxone.  - Change ceftazidine to ceftriaxone.  - Tylenol prn fever - Hydrocodone prn pain -wbc trending up. Continue with fluids and IV antibiotics.  -Foley catheter placed, recommended by urology After foley place, patient drain white purulent materia.  WBC trending down.  Continue with IV ceftriaxone. Discharge on cipro.  Might be able to discontinue foley prior to discharge, await urology recommendations.   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 Most likely  following patient's history of uterine cancer and radiation. Nephrostomy tubes reported to be last changed on 6/22. - ostomy care consult -IR evaluated patient, nephrostomy functioning well.   Question vesicoureteral fistula/ Question Urethral vs. Vaginal bleeding: CT found possible fistula formation, right inguinal lymphadenopathy. Family also gives report of intermittent bleeding when wiping self after urinating that could be coming from the bladder, or ureters but would want to make sure. Especially in the setting of recent weight loss and previous CT in 06/2015 noting mildly avid nodes and small focus within the endometrial cavity. Although certain findings could be secondary to the infection as seen above. Patient has been seen by Dr. Denman George of OB/GYN and previously by Dr. Tresa Moore of Urology.  - Transvaginal U/S to evaluate the endometrium showed mass vs hematoma  -urology consulted. Appreciate Dr Patsy Baltimore help. Ultrasound finding hematoma vs thickening of bladder. Recent cystoscopy in the office negative for mass.  -discussed with Dr Tresa Moore and Dr Denman George, they will see patient today in consultation. They will probably recommend lymph node biopsy while patient is inpatient. Per DR Denman George , patient can be discharge after biopsy, no need to wait for results. And patient will follow with Dr Denman George.   AKI; on chronic stage iv  Creatinine on admission 2.11 with a BUN of 33; which appears overall similar to last value obtained in March of 1.9. Elevated BUN could suggest some signs of dehydration in collaboration with history of decreased po intake. - IV fluids -cr stable. Follow trend.  -foley catheter inserted.  Renal function improved.   Hyperglycemia with Diabetes mellitus type 2: Acute. Glucose elevated at 323 on admission likely secondary to underlying infection. Patient currently on only oral medications at this time. Patient's last hemoglobin A1c is unknown at this time. - Hypoglycemic protocol -  Held glyburide-metformin  -hold levemir due to hypoglycemia this am.  - likely need to discontinue metformin at discharge due to renal; failure.   Anemia: Hemoglobin previously between 9 and 10, but on presentation 8.4. The patient notes the vaginal bleeding with wiping after urination. Patient also found to have blood on urinalysis. Question exact source of this time. - T&S for possible need of blood products - hb trending down, transfuse 2 unit 7-23. Hb stable.    Hyponatremia: Sodium 129 admission. Patient still hyponatremic even after correction for hyperglycemia. - improved with IV fluids.   Hypoalbuminemia: Albumin 2.3 on admission.  - Check pre-albumin 7.8 - Glucerna shakes with meals   DVT prophylaxis: scd.  Code Status: full code. Discussed with patient Family Communication: daughter at bedside.  Disposition Plan: remain inpatient for IV antibiotics. Will need inpatient biopsy    Consultants:   Urology    Procedures:   none  Antimicrobials:   cefta 7-22---7-23   ceftriaxone 7-23   Subjective: She is feeling better, supra-pubic pain almost gone.    Objective: Vitals:   10/14/15 1400 10/14/15 2158 10/15/15 0657 10/15/15 1447  BP: (!) 142/57 (!) 149/58 (!) 145/72 (!) 150/58  Pulse: 85 81 85 70  Resp: 19 18 18 18   Temp: 98.7 F (37.1 C) 98 F (36.7 C) 98 F (36.7 C) 98.1 F (36.7 C)  TempSrc:  Oral    SpO2: 99% 99% 99% 98%  Weight:      Height:        Intake/Output Summary (Last 24 hours) at 10/15/15 1502 Last data filed at 10/15/15 1310  Gross per 24 hour  Intake          1113.33 ml  Output             3575 ml  Net         -2461.67 ml   Filed Weights   10/11/15 2153  Weight: 46.8 kg (103 lb 3.2 oz)    Examination:  General exam: Appears calm and comfortable  Respiratory system: Clear to auscultation. Respiratory effort normal. Cardiovascular system: S1 & S2 heard, RRR. No JVD, murmurs, rubs, gallops or clicks. No pedal  edema. Gastrointestinal system: Abdomen is nondistended, soft and supra-pubic tenderness. No organomegaly or masses felt. Normal bowel sounds heard. Supra-pubic with less tenderness, nephrostomy draining clear urine  Central nervous system: Alert and oriented. No focal neurological deficits. Extremities: Symmetric 5 x 5 power. Skin: No rashes, lesions or ulcers Psychiatry: Judgement and insight appear normal. Mood & affect appropriate.     Data Reviewed: I have personally reviewed following labs and imaging studies  CBC:  Recent Labs Lab 10/11/15 1425 10/12/15 0407 10/13/15 0509 10/14/15 0546 10/15/15 0735  WBC 19.2* 19.7* 21.4* 13.3* 10.8*  HGB 8.4* 8.0* 7.7* 9.8* 10.5*  HCT 25.9* 24.0* 24.3* 29.8* 31.6*  MCV 93.2 92.3 94.2 90.6 91.6  PLT 294 257 261 244 Q000111Q   Basic Metabolic Panel:  Recent Labs Lab 10/11/15 1425 10/12/15 0407 10/13/15 0509 10/14/15 0546 10/15/15 0735  NA 129* 135 135 136 138  K 4.8 4.1 3.8 3.8 4.3  CL 102 111 110 113* 112*  CO2 19* 19* 19* 18* 20*  GLUCOSE 323* 126* 66 111* 164*  BUN 33* 29* 24* 18 17  CREATININE 2.11* 2.32* 2.21* 1.85* 1.70*  CALCIUM 8.6* 8.5* 8.4* 8.3* 8.3*   GFR: Estimated Creatinine Clearance: 20.8 mL/min (by C-G formula based on SCr of 1.7 mg/dL). Liver Function Tests:  Recent Labs Lab  10/11/15 1425  AST 20  ALT 19  ALKPHOS 121  BILITOT 0.3  PROT 7.1  ALBUMIN 2.3*   No results for input(s): LIPASE, AMYLASE in the last 168 hours. No results for input(s): AMMONIA in the last 168 hours. Coagulation Profile:  Recent Labs Lab 10/11/15 2212  INR 1.43   Cardiac Enzymes: No results for input(s): CKTOTAL, CKMB, CKMBINDEX, TROPONINI in the last 168 hours. BNP (last 3 results) No results for input(s): PROBNP in the last 8760 hours. HbA1C: No results for input(s): HGBA1C in the last 72 hours. CBG:  Recent Labs Lab 10/14/15 1154 10/14/15 1719 10/14/15 2200 10/15/15 0753 10/15/15 1237  GLUCAP 191* 169* 173*  157* 161*   Lipid Profile: No results for input(s): CHOL, HDL, LDLCALC, TRIG, CHOLHDL, LDLDIRECT in the last 72 hours. Thyroid Function Tests: No results for input(s): TSH, T4TOTAL, FREET4, T3FREE, THYROIDAB in the last 72 hours. Anemia Panel: No results for input(s): VITAMINB12, FOLATE, FERRITIN, TIBC, IRON, RETICCTPCT in the last 72 hours. Sepsis Labs:  Recent Labs Lab 10/11/15 1602 10/11/15 2046  LATICACIDVEN 1.61 0.63    Recent Results (from the past 240 hour(s))  Blood Culture (routine x 2)     Status: None (Preliminary result)   Collection Time: 10/11/15  3:56 PM  Result Value Ref Range Status   Specimen Description BLOOD RIGHT FOREARM  Final   Special Requests IN PEDIATRIC BOTTLE 4CC  Final   Culture NO GROWTH 3 DAYS  Final   Report Status PENDING  Incomplete  Urine culture     Status: Abnormal   Collection Time: 10/11/15  4:16 PM  Result Value Ref Range Status   Specimen Description URINE, CATHETERIZED  Final   Special Requests NONE  Final   Culture >=100,000 COLONIES/mL PROTEUS MIRABILIS (A)  Final   Report Status 10/13/2015 FINAL  Final   Organism ID, Bacteria PROTEUS MIRABILIS (A)  Final      Susceptibility   Proteus mirabilis - MIC*    AMPICILLIN <=2 SENSITIVE Sensitive     CEFAZOLIN <=4 SENSITIVE Sensitive     CEFTRIAXONE <=1 SENSITIVE Sensitive     CIPROFLOXACIN <=0.25 SENSITIVE Sensitive     GENTAMICIN <=1 SENSITIVE Sensitive     IMIPENEM 4 SENSITIVE Sensitive     NITROFURANTOIN 128 RESISTANT Resistant     TRIMETH/SULFA <=20 SENSITIVE Sensitive     AMPICILLIN/SULBACTAM <=2 SENSITIVE Sensitive     PIP/TAZO <=4 SENSITIVE Sensitive     * >=100,000 COLONIES/mL PROTEUS MIRABILIS  Blood Culture (routine x 2)     Status: None (Preliminary result)   Collection Time: 10/11/15  4:16 PM  Result Value Ref Range Status   Specimen Description BLOOD LEFT FOREARM  Final   Special Requests BOTTLES DRAWN AEROBIC AND ANAEROBIC 5CC  Final   Culture NO GROWTH 3 DAYS   Final   Report Status PENDING  Incomplete         Radiology Studies: No results found.      Scheduled Meds: . amLODipine  2.5 mg Oral Daily  . cefTRIAXone (ROCEPHIN)  IV  1 g Intravenous Q24H  . feeding supplement (GLUCERNA SHAKE)  237 mL Oral BID BM  . insulin aspart  0-9 Units Subcutaneous TID WC  . pantoprazole  40 mg Oral Daily  . polyethylene glycol  17 g Oral BID  . senna-docusate  1 tablet Oral BID   Continuous Infusions: . sodium chloride 100 mL/hr at 10/15/15 1259     LOS: 4 days    Time spent:  25 minutes.     Elmarie Shiley, MD [facility] Pager [phone]  If 7PM-7AM, please contact night-coverage [email or web address] Password [email or web address] 10/15/2015, 3:02 PM

## 2015-10-16 ENCOUNTER — Other Ambulatory Visit: Payer: Self-pay | Admitting: Urology

## 2015-10-16 ENCOUNTER — Encounter (HOSPITAL_COMMUNITY): Payer: Self-pay | Admitting: General Surgery

## 2015-10-16 DIAGNOSIS — N183 Chronic kidney disease, stage 3 (moderate): Secondary | ICD-10-CM

## 2015-10-16 DIAGNOSIS — E440 Moderate protein-calorie malnutrition: Secondary | ICD-10-CM

## 2015-10-16 DIAGNOSIS — N179 Acute kidney failure, unspecified: Secondary | ICD-10-CM

## 2015-10-16 LAB — CULTURE, BLOOD (ROUTINE X 2)
CULTURE: NO GROWTH
Culture: NO GROWTH

## 2015-10-16 LAB — GLUCOSE, CAPILLARY
GLUCOSE-CAPILLARY: 162 mg/dL — AB (ref 65–99)
GLUCOSE-CAPILLARY: 167 mg/dL — AB (ref 65–99)
GLUCOSE-CAPILLARY: 164 mg/dL — AB (ref 65–99)
Glucose-Capillary: 126 mg/dL — ABNORMAL HIGH (ref 65–99)

## 2015-10-16 NOTE — Progress Notes (Signed)
PROGRESS NOTE    Heidi [name]  [id number] DOB: [birth year] DOA: 10/11/2015 PCP: No PCP Per Patient   Brief Narrative: Heidi [name] is a 75 y.o. 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  Presents with sepsis, secondary to UTI. Also with acute on chronic renal failure secondary to infection. CT scan with possible uterine-vesical fistula formation. Urology and GYN oncology consulted.    Assessment & Plan:   Principal Problem:   Sepsis due to urinary tract infection ([place]) Active Problems:   Obstructive uropathy   History of cervical cancer   Renal insufficiency   Hyponatremia   Hypoalbuminemia   Vesicouterine fistula   Diabetes mellitus, type 2 (HCC)   Sepsis secondary to Proteus urinary tract infection: Acute. Patient reports lower abdominal pain and difficulty urinating and defecating. Patient with fever up to 101F, WBC of 19.4, tachycardia, tachypnea, and UA positive for signs of a UTI. -initially on Ceftazidine; then narrowed to ceftriaxone, base on urine culture and sensitivity. - Tylenol prn fever - Hydrocodone prn pain -Foley catheter placed, recommended by urology After foley place, patient drain white purulent materia.  -WBC trended down WNL essentially.  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  -Most likely following patient's history of uterine cancer and radiation. Nephrostomy tubes reported to be last changed on 6/22. -IR evaluated patient, nephrostomy functioning well. -wound care consulted; will follow rec's   Question  vesicoureteral fistula/ Question Urethral vs. Vaginal bleeding: CT found possible fistula formation, right inguinal lymphadenopathy. Family also gives report of intermittent bleeding when wiping self after urinating that could be coming from the bladder, or ureters but would want to make sure. Especially in the setting of recent weight loss and previous CT in 06/2015 noting mildly avid nodes and small focus within the endometrial cavity. Although certain findings could be secondary to the infection as seen above. Patient has been seen by Dr. Denman George of OB/GYN and previously by Dr. Tresa Moore of Urology.  - Transvaginal U/S to evaluate the endometrium showed mass vs hematoma  -urology consulted. Appreciate Dr Patsy Baltimore help. Ultrasound finding hematoma vs thickening of bladder.  -discussed with Dr Tresa Moore and Dr Denman George. -plan is for inguinal lymph node biopsy, cystoscopy and bladder biopsy (planned for 7/27) -continue IV abx's for now   AKI; on chronic stage iv  Creatinine on admission 2.11 with a BUN of 33. Most likely due to acute infection and dehydration. -continue gentle IV fluids  -foley catheter in place; will discussed with urology regarding possibility of discontinuing catheter prior to discharge.  Renal function improved and back to baseline now.   Hyperglycemia with Diabetes mellitus type 2: Acute. Glucose elevated at 323 on admission likely secondary to underlying infection. Patient currently on only oral medications at home. -continue holding oral agents while inpatient -continue SSI -might need to have metformin discontinue at discharge due to renal failure.   Anemia: Hemoglobin previously between 9 and 10, but on presentation 8.4. The patient notes the vaginal bleeding with wiping after urination. Patient also found to have blood on urinalysis.  -hb continue trending down and patient was transfused 2 units of PRBC's on 7-23. -Hgb stable since then.  -will monitor Hgb trend  Hyponatremia:  Sodium 129  admission. Patient still hyponatremic even after correction for hyperglycemia. - improved with IV fluids.   Hypoalbuminemia/moderate protein calorie malnutrition: -Albumin 2.3 on admission.  -Checked pre-albumin 7.8 -continue Glucerna shakes with meals -reports appetite has been poor lately    DVT prophylaxis: scd.  Code Status: full code. Discussed with patient Family Communication: daughter at bedside.  Disposition Plan: remain inpatient for IV antibiotics. Will need inpatient biopsy and further urologic work up.   Consultants:   Urology    Procedures:   none  Antimicrobials:   cefta 7-22---7-23   ceftriaxone 7-23   Subjective: Minimal suprapubic discomfort; no fever and no CP or SOB. Renal function back to baseline now.    Objective: Vitals:   10/16/15 0517 10/16/15 0939 10/16/15 1419 10/16/15 2310  BP: (!) 157/61 (!) 166/60 (!) 149/53 (!) 163/50  Pulse: 64  78 64  Resp: 18   16  Temp: 97.7 F (36.5 C)  97.8 F (36.6 C) 98 F (36.7 C)  TempSrc:   Oral Oral  SpO2: 98%  97% 100%  Weight:      Height:        Intake/Output Summary (Last 24 hours) at 10/16/15 2326 Last data filed at 10/16/15 2224  Gross per 24 hour  Intake              480 ml  Output             3525 ml  Net            -3045 ml   Filed Weights   10/11/15 2153  Weight: 46.8 kg (103 lb 3.2 oz)    Examination: General exam: Appears calm and comfortable; afebrile, denies CP and SOB. Still with mild discomfort in her abd. Respiratory system: Clear to auscultation. Respiratory effort normal. Cardiovascular system: S1 & S2 heard, RRR. No JVD, murmurs, rubs, gallops or clicks. No pedal edema. Gastrointestinal system: Abdomen is nondistended, soft and with just mild supra-pubic tenderness. No organomegaly or masses felt. Normal bowel sounds heard. Nephrostomy draining clear urine  Central nervous system: Alert and oriented. No focal neurological deficits. Extremities: Symmetric 5  x 5 power. Skin: No rashes, lesions or ulcers Psychiatry: Judgement and insight appear normal. Mood & affect appropriate.   Data Reviewed: I have personally reviewed following labs and imaging studies  CBC:  Recent Labs Lab 10/11/15 1425 10/12/15 0407 10/13/15 0509 10/14/15 0546 10/15/15 0735  WBC 19.2* 19.7* 21.4* 13.3* 10.8*  HGB 8.4* 8.0* 7.7* 9.8* 10.5*  HCT 25.9* 24.0* 24.3* 29.8* 31.6*  MCV 93.2 92.3 94.2 90.6 91.6  PLT 294 257 261 244 [id number]   Basic Metabolic Panel:  Recent Labs Lab 10/11/15 1425 10/12/15 0407 10/13/15 0509 10/14/15 0546 10/15/15 0735  NA 129* 135 135 136 138  K 4.8 4.1 3.8 3.8 4.3  CL 102 111 110 113* 112*  CO2 19* 19* 19* 18* 20*  GLUCOSE 323* 126* 66 111* 164*  BUN 33* 29* 24* 18 17  CREATININE 2.11* 2.32* 2.21* 1.85* 1.70*  CALCIUM 8.6* 8.5* 8.4* 8.3* 8.3*   GFR: Estimated Creatinine Clearance: 20.8 mL/min (by C-G formula based on SCr of 1.7 mg/dL).   Liver Function Tests:  Recent Labs Lab 10/11/15 1425  AST 20  ALT 19  ALKPHOS 121  BILITOT 0.3  PROT 7.1  ALBUMIN 2.3*   Coagulation Profile:  Recent Labs Lab 10/11/15 2212  INR 1.43   CBG:  Recent Labs Lab 10/15/15 2314 10/16/15 0146 10/16/15 0952 10/16/15 1202 10/16/15 1708  GLUCAP 187* 162* 167* 126* 164*   Sepsis Labs:  Recent Labs Lab 10/11/15 1602 10/11/15 2046  LATICACIDVEN 1.61 0.63    Recent Results (from the past 240 hour(s))  Blood Culture (routine x 2)     Status: None   Collection Time: 10/11/15  3:56 PM  Result Value Ref Range Status   Specimen Description BLOOD RIGHT FOREARM  Final   Special Requests IN PEDIATRIC BOTTLE 4CC  Final   Culture NO GROWTH 5 DAYS  Final   Report Status 10/16/2015 FINAL  Final  Urine culture     Status: Abnormal   Collection Time: 10/11/15  4:16 PM  Result Value Ref Range Status   Specimen Description URINE, CATHETERIZED  Final   Special Requests NONE  Final   Culture >=100,000 COLONIES/mL PROTEUS MIRABILIS (A)   Final   Report Status 10/13/2015 FINAL  Final   Organism ID, Bacteria PROTEUS MIRABILIS (A)  Final      Susceptibility   Proteus mirabilis - MIC*    AMPICILLIN <=2 SENSITIVE Sensitive     CEFAZOLIN <=4 SENSITIVE Sensitive     CEFTRIAXONE <=1 SENSITIVE Sensitive     CIPROFLOXACIN <=0.25 SENSITIVE Sensitive     GENTAMICIN <=1 SENSITIVE Sensitive     IMIPENEM 4 SENSITIVE Sensitive     NITROFURANTOIN 128 RESISTANT Resistant     TRIMETH/SULFA <=20 SENSITIVE Sensitive     AMPICILLIN/SULBACTAM <=2 SENSITIVE Sensitive     PIP/TAZO <=4 SENSITIVE Sensitive     * >=100,000 COLONIES/mL PROTEUS MIRABILIS  Blood Culture (routine x 2)     Status: None   Collection Time: 10/11/15  4:16 PM  Result Value Ref Range Status   Specimen Description BLOOD LEFT FOREARM  Final   Special Requests BOTTLES DRAWN AEROBIC AND ANAEROBIC 5CC  Final   Culture NO GROWTH 5 DAYS  Final   Report Status 10/16/2015 FINAL  Final     Radiology Studies: No results found.    Scheduled Meds: . amLODipine  2.5 mg Oral Daily  . cefTRIAXone (ROCEPHIN)  IV  1 g Intravenous Q24H  . feeding supplement (GLUCERNA SHAKE)  237 mL Oral BID BM  . insulin aspart  0-9 Units Subcutaneous TID WC  . pantoprazole  40 mg Oral Daily  . polyethylene glycol  17 g Oral BID  . senna-docusate  1 tablet Oral BID   Continuous Infusions: . sodium chloride 100 mL/hr at 10/15/15 2329     LOS: 5 days    Time spent: 25 minutes.     Barton Dubois, MD [facility] Pager [phone]  If 7PM-7AM, please contact night-coverage [email or web address] Password [facility] 10/16/2015, 11:26 PM

## 2015-10-16 NOTE — Progress Notes (Signed)
Nutrition Follow-up  DOCUMENTATION CODES:   Not applicable  INTERVENTION:   -Continue Glucerna Shake po BID, each supplement provides 220 kcal and 10 grams of protein -Continue Magic Cup daily  NUTRITION DIAGNOSIS:   Inadequate oral intake related to poor appetite as evidenced by per patient/family report.  Progressing  GOAL:   Patient will meet greater than or equal to 90% of their needs  Progressing  MONITOR:   PO intake, Supplement acceptance, Labs  REASON FOR ASSESSMENT:   Malnutrition Screening Tool    ASSESSMENT:   Heidi [name] is a 75 y.o. 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   Pt talking on phone at time of visit. Spoke with pt family member at bedside. She reports pt's appetite has improved over the past few days. She confirms pt is consuming almost all of her food on trays provided (per doc flowsheets, meal completion 75-100%). She is also consuming Glucerna supplements.   Pt is currently NPO for inguinal LN biopsy due to bilateral inguinal lymphadenopathy. Per oncology notes, may represent recurrence and pt could be candidate for palliative chemotherapy, however, pt has expressed desire for no further treatments. Plan to follow-up with outpatient oncology service after discharge.   Labs reviewed: CBGS: 161-187.   Diet Order:  Diet NPO time specified Except for: Sips with Meds  Skin:  Reviewed, no issues  Last BM:  10/16/15  Height:   Ht Readings from Last 1 Encounters:  10/11/15 5\' 2"  (1.575 m)    Weight:   Wt Readings from Last 1 Encounters:  10/11/15 103 lb 3.2 oz (46.8 kg)    Ideal Body Weight:  50 kg  BMI:  Body mass index is 18.88 kg/m.  Estimated Nutritional Needs:   Kcal:   1500-1700  Protein:  65-80 grams  Fluid:  1.5-1.7 L  EDUCATION NEEDS:   No education needs identified at this time  Rondy Krupinski A. Jimmye Norman, RD, LDN, CDE Pager: [phone] After hours Pager: [phone]

## 2015-10-16 NOTE — Progress Notes (Signed)
Subjective:  1 - Bilateral Hydronephrosis / Stage 3 Renal Insufficiency - bilateral mod-severe hydro to deep pelvis / mass area by CT x several 2017.. Bilat neph tubes placed initially 07/2015 and recnetly changed. CR now 1.5-2's.   2 - Cervical Cancer / New Inguinal Adenopathy - h/o XRT in [place] for advanced endometrial cancer. Worsening ingiunal adenopathy by imaging this admission.   3 - Febrile UTI - fevers, malaise, bacteruria on presentation, UCX proteus sens all but macrobid. [place]. Now afebrile. This is mostly improved after neph tube flushing and bladder drainage. Known hydro as per above.   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 Fistula by CT this admission. Prior cysto in [facility] office w/o posterior bladder edema. Now with no vaginal output of urine with neph tubes and foley in place.    She lives in [place] and is in [place] visit[place] family for past few mos. Her graddaugher Heidi [name] is very involved with her care and lives in [place] and is reacahble at [phone]. [name]  Today "Heidi [name]" is seen in f/u above. She did not have inguinal node BX today, but I have palced her on schedule for inguinal node BX + cysto and bladder BX tomorrow 7/27 at 2pm at [facility].   Objective: Vital signs in last 24 hours: Temp:  [97.7 F (36.5 C)-98.1 F (36.7 C)] 97.8 F (36.6 C) (07/26 1419) Pulse Rate:  [63-78] 78 (07/26 1419) Resp:  [18] 18 (07/26 0517) BP: (149-166)/(49-61) 149/53 (07/26 1419) SpO2:  [97 %-98 %] 97 % (07/26 1419) Last BM Date: 10/16/15  Intake/Output from previous day: 07/25 0701 - 07/26 0700 In: 0  Out: 3200 [Urine:3200] Intake/Output this shift: Total I/O In: 240 [P.O.:240] Out: 750 [Urine:750]  General appearance: alert, cooperative, appears stated age and grand-daughter and great grandson at bedside.  Eyes: negative Nose: Nares normal. Septum midline. Mucosa normal. No drainage or sinus  tenderness. Throat: lips, mucosa, and tongue normal; teeth and gums normal Neck: supple, symmetrical, trachea midline Back: symmetric, no curvature. ROM normal. No CVA tenderness. Resp: non-labored on [place] O2 Cardio: Nl rate Pelvic: external genitalia normal and Foley c/d/i with clear yellow urine. Bilat neph tubes in palce with clear urine as well that is non-foul.  Extremities: extremities normal, atraumatic, no cyanosis or edema Skin: Skin color, texture, turgor normal. No rashes or lesions Lymph nodes: Cervical, supraclavicular, and axillary nodes normal. Neurologic: Grossly normal  Lab Results:   Recent Labs  10/14/15 0546 10/15/15 0735  WBC 13.3* 10.8*  HGB 9.8* 10.5*  HCT 29.8* 31.6*  PLT 244 282   BMET  Recent Labs  10/14/15 0546 10/15/15 0735  NA 136 138  K 3.8 4.3  CL 113* 112*  CO2 18* 20*  GLUCOSE 111* 164*  BUN 18 17  CREATININE 1.85* 1.70*  CALCIUM 8.3* 8.3*   PT/INR No results for input(s): LABPROT, INR in the last 72 hours. ABG No results for input(s): PHART, HCO3 in the last 72 hours.  Invalid input(s): PCO2, PO2  Studies/Results: No results found.  Anti-infectives: Anti-infectives    Start     Dose/Rate Route Frequency Ordered Stop   10/13/15 1500  cefTRIAXone (ROCEPHIN) 1 g in dextrose 5 % 50 mL IVPB     1 g 100 mL/hr over 30 Minutes Intravenous Every 24 hours 10/13/15 1420     10/11/15 2200  ceFEPIme (MAXIPIME) 1 g in dextrose 5 % 50 mL IVPB  Status:  Discontinued  1 g 100 mL/hr over 30 Minutes Intravenous Every 12 hours 10/11/15 2059 10/11/15 2106   10/11/15 2130  cefTAZidime (FORTAZ) 2 g in dextrose 5 % 50 mL IVPB  Status:  Discontinued     2 g 100 mL/hr over 30 Minutes Intravenous Every 24 hours 10/11/15 2107 10/13/15 1420   10/11/15 1600  cefTRIAXone (ROCEPHIN) 1 g in dextrose 5 % 50 mL IVPB  Status:  Discontinued     1 g 100 mL/hr over 30 Minutes Intravenous Every 24 hours 10/11/15 1528 10/11/15 2107   10/11/15 1530  cefTRIAXone  (ROCEPHIN) 2 g in dextrose 5 % 50 mL IVPB  Status:  Discontinued     2 g 100 mL/hr over 30 Minutes Intravenous  Once 10/11/15 1525 10/11/15 1528      Assessment/Plan:  1 - Bilateral Hydronephrosis / Stage 3 Renal Insufficiency -improved with neph tubes. This is likely radiation induced or malignant obstruction. Continue chronic neph tubes.   2 - Cervical Cancer / New Inguinal Adenopathy - I feel US guided BX of inguinal nodes warranted as if recurrent cancer this would greatly affect treatment / prognosis / counseling.   As this was unable to be performed toady, I will perform at time of cysto-BX tomorrow at [facility].   3 - Febrile UTI - improving by systemic paramaters on CX-specific therapy. Would rec transition to PO course with 2 weeks total treatment for discharge.   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 Malignant.   She is tentatively scheduled for operative cysto / BX / cystogram tomorros 7/27 at 2pm at [facility] to and from. Pt and primary team updated on plan and want to proceed.    Please call me directly with questions anytime.  [facility], [name] 10/16/2015

## 2015-10-16 NOTE — Consult Note (Signed)
Chief Complaint: bilateral inguinal lymphadenopathy, hx of cervical cancer  Referring Physician:Dr. Alexis Frock  Supervising Physician: Markus Daft  Patient Status: In-pt  HPI: Heidi [name] is an 75 y.o. female who is from [place] and is here visiting her family for the last several months.  She developed obstructive uropathy and had bilateral PCNs placed in May.  She has had these exchanged with no issues in late June.  She has been seen by Dr. Denman George for a concern over a central pelvis mass that was possibly a recurrence of her cervical cancer.  She had a PET scan that did not show hypermetabolic state of this area.  Repeat PET was recommended in 3 months. She presents to the hospital currently with a UTI.  She is being followed by medicine and urology.  She was also seen by Dr. Denman George as well.  Given in the size increase of her inguinal lymph nodes, Dr. Denman George and Dr. Tresa Moore have requested a US guided core biopsy to evaluate for recurrence.  Past Medical History:  Past Medical History:  Diagnosis Date  . Diabetes mellitus without complication ([place])   . Uterine cancer [facility])     Past Surgical History: History reviewed. No pertinent surgical history.  Family History: History reviewed. No pertinent family history.  Social History:  reports that she has never smoked. She does not have any smokeless tobacco history on file. She reports that she does not drink alcohol or use drugs.  Allergies:  Allergies  Allergen Reactions  . Penicillins Itching, Nausea And Vomiting and Rash    Has patient had a PCN reaction causing immediate rash, facial/tongue/throat swelling, SOB or lightheadedness with hypotension: Yes Has patient had a PCN reaction causing severe rash involving mucus membranes or skin necrosis: No Has patient had a PCN reaction that required hospitalization No Has patient had a PCN reaction occurring within the last 10 years: No If all of the above answers are "NO",  then may proceed with Cephalosporin use.     Medications:   Medication List    ASK your doctor about these medications   acetaminophen 500 MG tablet Commonly known as:  TYLENOL Take 500 mg by mouth every 6 (six) hours as needed for mild pain.   glyBURIDE-metformin 5-500 MG tablet Commonly known as:  GLUCOVANCE Take 1 tablet by mouth 2 (two) times daily as needed (for high blood sugar). Blood sugar > 150 then pt takes, if < 95 pt does not take   oxyCODONE-acetaminophen 5-325 MG tablet Commonly known as:  PERCOCET/ROXICET Take 1 tablet by mouth every 6 (six) hours as needed for moderate pain or severe pain.       Please HPI for pertinent positives, otherwise complete 10 system ROS negative.  Mallampati Score: MD Evaluation Airway: WNL Heart: WNL Abdomen: WNL Chest/ Lungs: WNL Other Pertinent Findings: has bilateral PCN drains ASA  Classification: 3 Mallampati/Airway Score: Two  Physical Exam: BP (!) 166/60   Pulse 64   Temp 97.7 F (36.5 C)   Resp 18   Ht _0  (1.575 m)   Wt 103 lb 3.2 oz (46.8 kg)   SpO2 98%   BMI 18.88 kg/m  Body mass index is 18.88 kg/m. General: pleasant, WD, WN Hispanic female who is laying in bed in NAD HEENT: head is normocephalic, atraumatic.  Sclera are noninjected.  PERRL.  Ears and nose without any masses or lesions.  Mouth is pink and moist Heart: regular, rate, and rhythm.  Normal s1,s2.  No obvious murmurs, gallops, or rubs noted.  Palpable radial and pedal pulses bilaterally Lungs: CTAB, no wheezes, rhonchi, or rales noted.  Respiratory effort nonlabored Abd: soft, NT, ND, +BS, no masses, hernias, or organomegaly.  Bilateral PCNs in place with clear yellow urine and the sites are c/d/i Lymph: she has palpable bilateral inguinal nodes.   Psych: A&Ox3 with an appropriate affect.   Labs: Results for orders placed or performed during the hospital encounter of 10/11/15 (from the past 48 hour(s))  Glucose, capillary     Status:  Abnormal   Collection Time: 10/14/15 11:54 AM  Result Value Ref Range   Glucose-Capillary 191 (H) 65 - 99 mg/dL  Glucose, capillary     Status: Abnormal   Collection Time: 10/14/15  5:19 PM  Result Value Ref Range   Glucose-Capillary 169 (H) 65 - 99 mg/dL  Glucose, capillary     Status: Abnormal   Collection Time: 10/14/15 10:00 PM  Result Value Ref Range   Glucose-Capillary 173 (H) 65 - 99 mg/dL  CBC     Status: Abnormal   Collection Time: 10/15/15  7:35 AM  Result Value Ref Range   WBC 10.8 (H) 4.0 - 10.5 K/uL   RBC 3.45 (L) 3.87 - 5.11 MIL/uL   Hemoglobin 10.5 (L) 12.0 - 15.0 g/dL   HCT 31.6 (L) 36.0 - 46.0 %   MCV 91.6 78.0 - 100.0 fL   MCH 30.4 26.0 - 34.0 pg   MCHC 33.2 30.0 - 36.0 g/dL   RDW 14.7 11.5 - 15.5 %   Platelets 282 150 - 400 K/uL  Basic metabolic panel     Status: Abnormal   Collection Time: 10/15/15  7:35 AM  Result Value Ref Range   Sodium 138 135 - 145 mmol/L   Potassium 4.3 3.5 - 5.1 mmol/L   Chloride 112 (H) 101 - 111 mmol/L   CO2 20 (L) 22 - 32 mmol/L   Glucose, Bld 164 (H) 65 - 99 mg/dL   BUN 17 6 - 20 mg/dL   Creatinine, Ser 1.70 (H) 0.44 - 1.00 mg/dL   Calcium 8.3 (L) 8.9 - 10.3 mg/dL   GFR calc non Af Amer 28 (L) >60 mL/min   GFR calc Af Amer 33 (L) >60 mL/min    Comment: (NOTE) The eGFR has been calculated using the CKD EPI equation. This calculation has not been validated in all clinical situations. eGFR's persistently <60 mL/min signify possible Chronic Kidney Disease.    Anion gap 6 5 - 15  Glucose, capillary     Status: Abnormal   Collection Time: 10/15/15  7:53 AM  Result Value Ref Range   Glucose-Capillary 157 (H) 65 - 99 mg/dL  Glucose, capillary     Status: Abnormal   Collection Time: 10/15/15 12:37 PM  Result Value Ref Range   Glucose-Capillary 161 (H) 65 - 99 mg/dL  Glucose, capillary     Status: Abnormal   Collection Time: 10/15/15  5:03 PM  Result Value Ref Range   Glucose-Capillary 157 (H) 65 - 99 mg/dL  Glucose,  capillary     Status: Abnormal   Collection Time: 10/15/15 11:14 PM  Result Value Ref Range   Glucose-Capillary 187 (H) 65 - 99 mg/dL  Glucose, capillary     Status: Abnormal   Collection Time: 10/16/15  1:46 AM  Result Value Ref Range   Glucose-Capillary 162 (H) 65 - 99 mg/dL  Glucose, capillary     Status: Abnormal   Collection Time: 10/16/15  9:52 AM  Result Value Ref Range   Glucose-Capillary 167 (H) 65 - 99 mg/dL    Imaging: No results found.  Assessment/Plan 1. Bilateral inguinal lymphadenopathy, hx of cervical cancer -we will try to proceed with inguinal LN BX today.  If we are unable to do this today, then we will proceed tomorrow -remain NPO for today, will let eat if unable to do today -Risks and Benefits discussed with the patient including, but not limited to bleeding, infection, damage to adjacent structures or low yield requiring additional tests. All of the patient's questions were answered, patient is agreeable to proceed. Consent signed and in chart.   Thank you for this interesting consult.  I greatly enjoyed meeting Heidi [name] and look forward to participating in their care.  A copy of this report was sent to the requesting provider on this date.  Electronically Signed: Henreitta Cea 10/16/2015, 10:20 AM   I spent a total of 40 Minutes    in face to face in clinical consultation, greater than 50% of which was counseling/coordinating care for lymphadenopathy, inguinal

## 2015-10-17 ENCOUNTER — Inpatient Hospital Stay (HOSPITAL_COMMUNITY): Payer: Medicaid Other | Admitting: Anesthesiology

## 2015-10-17 ENCOUNTER — Encounter (HOSPITAL_COMMUNITY): Payer: Self-pay | Admitting: Anesthesiology

## 2015-10-17 ENCOUNTER — Encounter (HOSPITAL_COMMUNITY)
Admission: EM | Disposition: A | Discharge status: Home / Self Care | Payer: Self-pay | Source: Home / Self Care | Attending: Internal Medicine

## 2015-10-17 ENCOUNTER — Ambulatory Visit (HOSPITAL_COMMUNITY): Admission: RE | Admit: 2015-10-17 | Payer: Medicaid Other | Source: Ambulatory Visit | Admitting: Urology

## 2015-10-17 DIAGNOSIS — E1122 Type 2 diabetes mellitus with diabetic chronic kidney disease: Secondary | ICD-10-CM

## 2015-10-17 DIAGNOSIS — N184 Chronic kidney disease, stage 4 (severe): Secondary | ICD-10-CM

## 2015-10-17 HISTORY — PX: CYSTOSCOPY WITH FULGERATION: SHX6638

## 2015-10-17 HISTORY — PX: CYSTOGRAM: SHX6285

## 2015-10-17 HISTORY — PX: CYSTOSCOPY WITH BIOPSY: SHX5122

## 2015-10-17 LAB — GLUCOSE, CAPILLARY
GLUCOSE-CAPILLARY: 102 mg/dL — AB (ref 65–99)
Glucose-Capillary: 170 mg/dL — ABNORMAL HIGH (ref 65–99)
Glucose-Capillary: 202 mg/dL — ABNORMAL HIGH (ref 65–99)

## 2015-10-17 LAB — BASIC METABOLIC PANEL
ANION GAP: 7 (ref 5–15)
BUN: 16 mg/dL (ref 6–20)
CALCIUM: 8.3 mg/dL — AB (ref 8.9–10.3)
CO2: 21 mmol/L — AB (ref 22–32)
Chloride: 110 mmol/L (ref 101–111)
Creatinine, Ser: 1.44 mg/dL — ABNORMAL HIGH (ref 0.44–1.00)
GFR calc Af Amer: 40 mL/min — ABNORMAL LOW (ref >60)
GFR, EST NON AFRICAN AMERICAN: 34 mL/min — AB (ref >60)
GLUCOSE: 157 mg/dL — AB (ref 65–99)
POTASSIUM: 3.8 mmol/L (ref 3.5–5.1)
Sodium: 138 mmol/L (ref 135–145)

## 2015-10-17 LAB — CBC
HEMATOCRIT: 31.3 % — AB (ref 36.0–46.0)
Hemoglobin: 10.5 g/dL — ABNORMAL LOW (ref 12.0–15.0)
MCH: 30.7 pg (ref 26.0–34.0)
MCHC: 33.5 g/dL (ref 30.0–36.0)
MCV: 91.5 fL (ref 78.0–100.0)
PLATELETS: 271 10*3/uL (ref 150–400)
RBC: 3.42 MIL/uL — ABNORMAL LOW (ref 3.87–5.11)
RDW: 14 % (ref 11.5–15.5)
WBC: 7.8 10*3/uL (ref 4.0–10.5)

## 2015-10-17 SURGERY — CYSTOGRAM
Anesthesia: General

## 2015-10-17 MED ORDER — ACETAMINOPHEN 325 MG PO TABS: 325.0000 mg | ORAL_TABLET | ORAL | Status: DC | PRN

## 2015-10-17 MED ORDER — 0.9 % SODIUM CHLORIDE (POUR BTL) OPTIME
TOPICAL | Status: DC | PRN
  Administered 2015-10-17: 1000 mL

## 2015-10-17 MED ORDER — LIDOCAINE HCL (CARDIAC) 20 MG/ML IV SOLN
INTRAVENOUS | Status: DC | PRN
  Administered 2015-10-17: 50 mg via INTRAVENOUS

## 2015-10-17 MED ORDER — LACTATED RINGERS IV SOLN
INTRAVENOUS | Status: DC
  Administered 2015-10-17: 14:00:00 via INTRAVENOUS

## 2015-10-17 MED ORDER — OXYCODONE HCL 5 MG/5ML PO SOLN
5.0000 mg | Freq: Once | ORAL | Status: DC | PRN
  Filled 2015-10-17: qty 5

## 2015-10-17 MED ORDER — FENTANYL CITRATE (PF) 100 MCG/2ML IJ SOLN
25.0000 ug | INTRAMUSCULAR | Status: DC | PRN
  Administered 2015-10-17: 25 ug via INTRAVENOUS
  Administered 2015-10-17: 50 ug via INTRAVENOUS
  Administered 2015-10-17: 25 ug via INTRAVENOUS

## 2015-10-17 MED ORDER — DIATRIZOATE MEGLUMINE 30 % UR SOLN
URETHRAL | Status: DC | PRN
  Administered 2015-10-17: 100 mL via URETHRAL
  Administered 2015-10-17: 60 mL via URETHRAL

## 2015-10-17 MED ORDER — FENTANYL CITRATE (PF) 100 MCG/2ML IJ SOLN
INTRAMUSCULAR | Status: AC
  Filled 2015-10-17: qty 2

## 2015-10-17 MED ORDER — ACETAMINOPHEN 160 MG/5ML PO SOLN: 325.0000 mg | ORAL | Status: DC | PRN

## 2015-10-17 MED ORDER — ONDANSETRON HCL 4 MG/2ML IJ SOLN
INTRAMUSCULAR | Status: DC | PRN
  Administered 2015-10-17: 4 mg via INTRAVENOUS

## 2015-10-17 MED ORDER — PROPOFOL 10 MG/ML IV BOLUS
INTRAVENOUS | Status: DC | PRN
  Administered 2015-10-17: 40 mg via INTRAVENOUS
  Administered 2015-10-17: 60 mg via INTRAVENOUS
  Administered 2015-10-17: 20 mg via INTRAVENOUS

## 2015-10-17 MED ORDER — OXYCODONE HCL 5 MG PO TABS: 5.0000 mg | ORAL_TABLET | Freq: Once | ORAL | Status: DC | PRN

## 2015-10-17 MED ORDER — SODIUM CHLORIDE 0.9 % IR SOLN
Status: DC | PRN
  Administered 2015-10-17: 6000 mL

## 2015-10-17 MED ORDER — LIDOCAINE HCL (CARDIAC) 20 MG/ML IV SOLN: INTRAVENOUS | Status: DC | PRN

## 2015-10-17 MED ORDER — FENTANYL CITRATE (PF) 100 MCG/2ML IJ SOLN
INTRAMUSCULAR | Status: DC | PRN
  Administered 2015-10-17 (×4): 25 ug via INTRAVENOUS

## 2015-10-17 MED ORDER — IOHEXOL 300 MG/ML  SOLN
INTRAMUSCULAR | Status: DC | PRN
  Administered 2015-10-17: 50 mL via INTRAVENOUS

## 2015-10-17 SURGICAL SUPPLY — 32 items
BAG URINE DRAINAGE (UROLOGICAL SUPPLIES) ×4 IMPLANT
BAG URO CATCHER STRL LF (MISCELLANEOUS) ×4 IMPLANT
BASKET LASER NITINOL 1.9FR (BASKET) IMPLANT
CATH FOLEY 2WAY SLVR 30CC 22FR (CATHETERS) ×4 IMPLANT
CATH INTERMIT  6FR 70CM (CATHETERS) ×4 IMPLANT
CLOTH BEACON ORANGE TIMEOUT ST (SAFETY) ×4 IMPLANT
COVER PROBE U/S 5X48 (MISCELLANEOUS) ×4 IMPLANT
ELECT REM PT RETURN 9FT ADLT (ELECTROSURGICAL) ×4
ELECTRODE REM PT RTRN 9FT ADLT (ELECTROSURGICAL) ×2 IMPLANT
EVACUATOR MICROVAS BLADDER (UROLOGICAL SUPPLIES) IMPLANT
FIBER LASER FLEXIVA 1000 (UROLOGICAL SUPPLIES) IMPLANT
FIBER LASER FLEXIVA 365 (UROLOGICAL SUPPLIES) IMPLANT
FIBER LASER FLEXIVA 550 (UROLOGICAL SUPPLIES) IMPLANT
FIBER LASER TRAC TIP (UROLOGICAL SUPPLIES) IMPLANT
GLOVE BIOGEL M STRL SZ7.5 (GLOVE) ×4 IMPLANT
GOWN STRL REUS W/TWL LRG LVL3 (GOWN DISPOSABLE) ×8 IMPLANT
GUIDEWIRE ANG ZIPWIRE 038X150 (WIRE) ×4 IMPLANT
GUIDEWIRE STR DUAL SENSOR (WIRE) ×4 IMPLANT
INSTRUMENT FULL CORE 16GA 15CM (MISCELLANEOUS) ×4 IMPLANT
IV NS 1000ML (IV SOLUTION) ×2
IV NS 1000ML BAXH (IV SOLUTION) ×2 IMPLANT
LOOP CUT BIPOLAR 24F LRG (ELECTROSURGICAL) ×4 IMPLANT
MANIFOLD NEPTUNE II (INSTRUMENTS) ×4 IMPLANT
NEEDLE BIOPSY 14GX4.5 SOFT TIS (NEEDLE) ×4 IMPLANT
PACK CYSTO (CUSTOM PROCEDURE TRAY) ×4 IMPLANT
PAD TELFA 2X3 NADH STRL (GAUZE/BANDAGES/DRESSINGS) ×8 IMPLANT
SET ASPIRATION TUBING (TUBING) IMPLANT
SYR CONTROL 10ML LL (SYRINGE) ×4 IMPLANT
SYRINGE IRR TOOMEY STRL 70CC (SYRINGE) IMPLANT
TUBE FEEDING 8FR 16IN STR KANG (MISCELLANEOUS) ×4 IMPLANT
TUBING CONNECTING 10 (TUBING) ×3 IMPLANT
TUBING CONNECTING 10' (TUBING) ×1

## 2015-10-17 NOTE — Transfer of Care (Signed)
Immediate Anesthesia Transfer of Care Note  Patient: Heidi [name]  Procedure(s) Performed: Procedure(s): CYSTOGRAM (N/A) CYSTOSCOPY WITH BLADDER BIOPSY AND INGUINAL NODE BIOPSY WITH ULTRASOUND (N/A) CYSTOSCOPY WITH FULGERATION (N/A)  Patient Location: PACU  Anesthesia Type:General  Level of Consciousness:  sedated, patient cooperative and responds to stimulation  Airway & Oxygen Therapy:Patient Spontanous Breathing and Patient connected to face mask oxgen  Post-op Assessment:  Report given to PACU RN and Post -op Vital signs reviewed and stable  Post vital signs:  Reviewed and stable  Last Vitals:  Vitals:   10/17/15 1430 10/17/15 1500  BP: (!) 169/61 (!) 148/63  Pulse: 63 66  Resp: 12 16  Temp:      Complications: No apparent anesthesia complications

## 2015-10-17 NOTE — Progress Notes (Deleted)
Pt potassium is now 2.9 . On call family medicine MD notified and to put in orders.

## 2015-10-17 NOTE — Anesthesia Preprocedure Evaluation (Signed)
Anesthesia Evaluation  Patient identified by MRN, date of birth, ID band Patient awake    Reviewed: Allergy & Precautions, NPO status , Patient's Chart, lab work & pertinent test results  History of Anesthesia Complications Negative for: history of anesthetic complications  Airway Mallampati: II  TM Distance: >3 FB Neck ROM: Full    Dental  (+) Teeth Intact   Pulmonary neg pulmonary ROS,    breath sounds clear to auscultation       Cardiovascular negative cardio ROS   Rhythm:Regular     Neuro/Psych negative neurological ROS  negative psych ROS   GI/Hepatic negative GI ROS, Neg liver ROS,   Endo/Other  diabetes, Type 2  Renal/GU Renal InsufficiencyRenal disease     Musculoskeletal   Abdominal   Peds  Hematology  (+) anemia ,   Anesthesia Other Findings   Reproductive/Obstetrics                             Anesthesia Physical Anesthesia Plan  ASA: III  Anesthesia Plan: General   Post-op Pain Management:    Induction: Intravenous  Airway Management Planned: Oral ETT and LMA  Additional Equipment: None  Intra-op Plan:   Post-operative Plan: Extubation in OR  Informed Consent: I have reviewed the patients History and Physical, chart, labs and discussed the procedure including the risks, benefits and alternatives for the proposed anesthesia with the patient or authorized representative who has indicated his/her understanding and acceptance.   Dental advisory given  Plan Discussed with: CRNA and Surgeon  Anesthesia Plan Comments:         Anesthesia Quick Evaluation

## 2015-10-17 NOTE — Progress Notes (Signed)
PACU Charting all completed by Gust Rung, RN

## 2015-10-17 NOTE — Brief Op Note (Signed)
10/11/2015 - 10/17/2015  5:02 PM  PATIENT:  Heidi [name]  75 y.o. female  PRE-OPERATIVE DIAGNOSIS:  HYDRONEPHROSIS, PELVIC MASS, POSSIBLE BLADDER -URETERINE FISTULA, LARGE INGUINAL NODES  POST-OPERATIVE DIAGNOSIS:  HYDRONEPHROSIS, PELVIC MASS, POSSIBLE BLADDER -URETERINE FISTULA, LARGE INGUINAL NODES  PROCEDURE:  Procedure(s): CYSTOGRAM (N/A) CYSTOSCOPY WITH BLADDER BIOPSY AND INGUINAL NODE BIOPSY WITH ULTRASOUND (N/A) CYSTOSCOPY WITH FULGERATION (N/A)  SURGEON:  Surgeon(s) and Role:    * Alexis Frock, MD - Primary  PHYSICIAN ASSISTANT:   ASSISTANTS: none   ANESTHESIA:   general  EBL:  Total I/O In: 1475 [I.V.:800; Other:675] Out: 225 [Urine:225]  BLOOD ADMINISTERED:none  DRAINS: 1 - foley to gravity   LOCAL MEDICATIONS USED:  NONE  SPECIMEN:  Source of Specimen:  1 - Rt inguinal lymph node core, 2 - Bladder BX (area near fistula)  DISPOSITION OF SPECIMEN:  PATHOLOGY  COUNTS:  YES  TOURNIQUET:  * No tourniquets in log *  DICTATION: .Other Dictation: Dictation Number [id number]  PLAN OF CARE: Admit to inpatient   PATIENT DISPOSITION:  PACU - hemodynamically stable.   Delay start of Pharmacological VTE agent (>24hrs) due to surgical blood loss or risk of bleeding: yes

## 2015-10-17 NOTE — Progress Notes (Signed)
PROGRESS NOTE    Heidi [name]  [id number] DOB: January 23, 1941 DOA: 10/11/2015 PCP: No PCP Per Patient   Brief Narrative: Heidi [name] is a 75 y.o. 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  Presents with sepsis, secondary to UTI. Also with acute on chronic renal failure secondary to infection. CT scan with possible uterine-vesical fistula formation. Urology and GYN oncology consulted.    Assessment & Plan:   Principal Problem:   Sepsis due to urinary tract infection ([place]) Active Problems:   Obstructive uropathy   History of cervical cancer   Renal insufficiency   Hyponatremia   Hypoalbuminemia   Vesicouterine fistula   Diabetes mellitus, type 2 (HCC)   Sepsis secondary to Proteus urinary tract infection: Acute. Patient reports lower abdominal pain and difficulty urinating and defecating. Patient with fever up to 101F, WBC of 19.4, tachycardia, tachypnea, and UA positive for signs of a UTI. -initially on Ceftazidine; then narrowed to ceftriaxone, base on urine culture and sensitivity. - Tylenol prn fever - Hydrocodone prn pain -Foley catheter placed, recommended by urology After foley place, patient drain white purulent materia.  -WBC trended down WNL essentially.  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  -Most likely following patient's history of uterine cancer and radiation. Nephrostomy tubes reported to be last changed on 6/22. -IR evaluated patient, nephrostomy functioning well. -wound care consulted; will  follow rec's   Question vesicoureteral fistula/ Question Urethral vs. Vaginal bleeding: CT found possible fistula formation, right inguinal lymphadenopathy. Family also gives report of intermittent bleeding when wiping self after urinating that could be coming from the bladder, or ureters but would want to make sure. Especially in the setting of recent weight loss and previous CT in 06/2015 noting mildly avid nodes and small focus within the endometrial cavity. Although certain findings could be secondary to the infection as seen above. Patient has been seen by Dr. Denman George of OB/GYN and previously by Dr. Tresa Moore of Urology.  - Transvaginal U/S to evaluate the endometrium showed mass vs hematoma  -urology consulted. Appreciate Dr Patsy Baltimore help. Ultrasound finding hematoma vs thickening of bladder.  -discussed with Dr Tresa Moore and Dr Denman George. -plan is for inguinal lymph node biopsy, cystoscopy and bladder biopsy later today (7/27) -continue IV abx's for now   AKI; on chronic stage IV  Creatinine on admission 2.11 with a BUN of 33. Most likely due to acute infection and dehydration. -continue gentle IV fluids  -foley catheter in place; will discussed with urology regarding possibility of discontinuing catheter prior to discharge.  Renal function improved and back to baseline now.   Hyperglycemia with Diabetes mellitus type 2: Acute. Glucose elevated at 323 on admission likely secondary to underlying infection. Patient currently on only oral medications at home. -continue holding oral agents while inpatient -continue SSI -might need to have metformin discontinue at discharge due to renal failure.   Anemia: Hemoglobin previously between 9 and 10, but on presentation 8.4. The patient notes the vaginal bleeding with wiping after urination. Patient also found to have blood on urinalysis.  -hb continue trending down and patient was transfused 2 units of PRBC's on 7-23. -Hgb stable since then.  -will monitor Hgb  trend  Hyponatremia: Sodium 129 admission. Most likely secondary to dehydration. - improved with IV fluids.  -will monitor   Hypoalbuminemia/moderate protein calorie malnutrition: -Albumin 2.3 on admission.  -Checked pre-albumin 7.8 -continue Glucerna shakes with meals -reports appetite has been poor lately    DVT prophylaxis: scd.  Code Status: full code. Discussed with patient Family Communication: daughter at bedside.  Disposition Plan: remain inpatient for IV antibiotics. Will have inguinal lymph node biopsy and bladder biopsy later today.  Consultants:   Urology    Procedures:   none  Antimicrobials:   cefta 7-22---7-23   ceftriaxone 7-23   Subjective: Minimal suprapubic discomfort; no fever and no CP or SOB. Renal function back to baseline now. Patient slightly anxious about procedure later today  Objective: Vitals:   10/17/15 1430 10/17/15 1500 10/17/15 1717 10/17/15 1720  BP: (!) 169/61 (!) 148/63 (!) 169/79   Pulse: 63 66 80 80  Resp: 12 16 14 11   Temp:   97.8 F (36.6 C)   TempSrc:      SpO2: 98% 98% 100% 100%  Weight:      Height:        Intake/Output Summary (Last 24 hours) at 10/17/15 1744 Last data filed at 10/17/15 1719  Gross per 24 hour  Intake             2215 ml  Output             2925 ml  Net             -710 ml   Filed Weights   10/11/15 2153  Weight: 46.8 kg (103 lb 3.2 oz)    Examination: General exam: Appears calm and comfortable; afebrile, denies CP and SOB. Still with mild discomfort in her abd and slightly anxious about procedure later today. Respiratory system: Clear to auscultation. Respiratory effort normal. Cardiovascular system: S1 & S2 heard, RRR. No JVD, murmurs, rubs, gallops or clicks. No pedal edema. Gastrointestinal system: Abdomen is nondistended, soft and with just mild supra-pubic tenderness. No organomegaly or masses felt. Normal bowel sounds heard. Nephrostomy draining clear urine  Central nervous system:  Alert and oriented. No focal neurological deficits. Extremities: Symmetric 5 x 5 power. Skin: No rashes, lesions or ulcers Psychiatry: Judgement and insight appear normal. Mood & affect appropriate.   Data Reviewed: I have personally reviewed following labs and imaging studies  CBC:  Recent Labs Lab 10/12/15 0407 10/13/15 0509 10/14/15 0546 10/15/15 0735 10/17/15 0438  WBC 19.7* 21.4* 13.3* 10.8* 7.8  HGB 8.0* 7.7* 9.8* 10.5* 10.5*  HCT 24.0* 24.3* 29.8* 31.6* 31.3*  MCV 92.3 94.2 90.6 91.6 91.5  PLT 257 261 244 282 [id number]   Basic Metabolic Panel:  Recent Labs Lab 10/12/15 0407 10/13/15 0509 10/14/15 0546 10/15/15 0735 10/17/15 0438  NA 135 135 136 138 138  K 4.1 3.8 3.8 4.3 3.8  CL 111 110 113* 112* 110  CO2 19* 19* 18* 20* 21*  GLUCOSE 126* 66 111* 164* 157*  BUN 29* 24* 18 17 16   CREATININE 2.32* 2.21* 1.85* 1.70* 1.44*  CALCIUM 8.5* 8.4* 8.3* 8.3* 8.3*   GFR: Estimated Creatinine Clearance: 24.6 mL/min (by C-G formula based on SCr of 1.44 mg/dL).   Liver Function Tests:  Recent Labs Lab 10/11/15 1425  AST 20  ALT 19  ALKPHOS 121  BILITOT 0.3  PROT 7.1  ALBUMIN 2.3*   Coagulation Profile:  Recent Labs Lab 10/11/15 2212  INR 1.43   CBG:  Recent Labs Lab 10/16/15  [id number] 10/16/15 1202 10/16/15 1708 10/17/15 0749 10/17/15 1734  GLUCAP 167* 126* 164* 170* 102*   Sepsis Labs:  Recent Labs Lab 10/11/15 1602 10/11/15 2046  LATICACIDVEN 1.61 0.63    Recent Results (from the past 240 hour(s))  Blood Culture (routine x 2)     Status: None   Collection Time: 10/11/15  3:56 PM  Result Value Ref Range Status   Specimen Description BLOOD RIGHT FOREARM  Final   Special Requests IN PEDIATRIC BOTTLE 4CC  Final   Culture NO GROWTH 5 DAYS  Final   Report Status 10/16/2015 FINAL  Final  Urine culture     Status: Abnormal   Collection Time: 10/11/15  4:16 PM  Result Value Ref Range Status   Specimen Description URINE, CATHETERIZED  Final   Special  Requests NONE  Final   Culture >=100,000 COLONIES/mL PROTEUS MIRABILIS (A)  Final   Report Status 10/13/2015 FINAL  Final   Organism ID, Bacteria PROTEUS MIRABILIS (A)  Final      Susceptibility   Proteus mirabilis - MIC*    AMPICILLIN <=2 SENSITIVE Sensitive     CEFAZOLIN <=4 SENSITIVE Sensitive     CEFTRIAXONE <=1 SENSITIVE Sensitive     CIPROFLOXACIN <=0.25 SENSITIVE Sensitive     GENTAMICIN <=1 SENSITIVE Sensitive     IMIPENEM 4 SENSITIVE Sensitive     NITROFURANTOIN 128 RESISTANT Resistant     TRIMETH/SULFA <=20 SENSITIVE Sensitive     AMPICILLIN/SULBACTAM <=2 SENSITIVE Sensitive     PIP/TAZO <=4 SENSITIVE Sensitive     * >=100,000 COLONIES/mL PROTEUS MIRABILIS  Blood Culture (routine x 2)     Status: None   Collection Time: 10/11/15  4:16 PM  Result Value Ref Range Status   Specimen Description BLOOD LEFT FOREARM  Final   Special Requests BOTTLES DRAWN AEROBIC AND ANAEROBIC 5CC  Final   Culture NO GROWTH 5 DAYS  Final   Report Status 10/16/2015 FINAL  Final     Radiology Studies: No results found.    Scheduled Meds: . [MAR Hold] amLODipine  2.5 mg Oral Daily  . [MAR Hold] cefTRIAXone (ROCEPHIN)  IV  1 g Intravenous Q24H  . [MAR Hold] feeding supplement (GLUCERNA SHAKE)  237 mL Oral BID BM  . fentaNYL      . [MAR Hold] insulin aspart  0-9 Units Subcutaneous TID WC  . [MAR Hold] pantoprazole  40 mg Oral Daily  . [MAR Hold] polyethylene glycol  17 g Oral BID  . [MAR Hold] senna-docusate  1 tablet Oral BID   Continuous Infusions: . sodium chloride Stopped (10/17/15 1335)  . lactated ringers 125 mL/hr at 10/17/15 1335     LOS: 6 days    Time spent: 25 minutes.     Barton Dubois, MD [facility] Pager [phone]  If 7PM-7AM, please contact night-coverage [email or web address] Password [facility] 10/17/2015, 5:44 PM

## 2015-10-17 NOTE — Progress Notes (Signed)
Subjective:  1 - Bilateral Hydronephrosis / Stage 3 Renal Insufficiency - bilateral mod-severe hydro to deep pelvis / mass area by CT x several 2017.. Bilat neph tubes placed initially 07/2015 and recnetly changed. CR now 1.5-2's.   2 - Cervical Cancer / New Inguinal Adenopathy - h/o XRT in [place] for advanced endometrial cancer. Worsening ingiunal adenopathy by imaging this admission.   3 - Febrile UTI - fevers, malaise, bacteruria on presentation, UCX proteus sens all but macrobid. [place]. Now afebrile. This is mostly improved after neph tube flushing and bladder drainage. Known hydro as per above.   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 Fistula by CT this admission. Prior cysto in Urol office w/o posterior bladder edema. Now with no vaginal output of urine with neph tubes and foley in place.    She lives in [place] and is in [place] visitin family for past few mos. Her graddaugher Heidi [name] is very involved with her care and lives in [place] and is reacahble at [phone]. [name]  Today "Heidi [name]" is seen in f/u above. She is scheduled for inguinal node BX + cysto and bladder BX today. No interval fevers. She remains on rocephin.   Objective: Vital signs in last 24 hours: Temp:  [97.8 F (36.6 C)-98 F (36.7 C)] 98 F (36.7 C) (07/27 0521) Pulse Rate:  [62-78] 62 (07/27 0521) Resp:  [16] 16 (07/27 0521) BP: (148-166)/(50-60) 148/52 (07/27 0521) SpO2:  [95 %-100 %] 95 % (07/27 0521) Last BM Date: 10/16/15  Intake/Output from previous day: 07/26 0701 - 07/27 0700 In: 480 [P.O.:480] Out: 3450 [Urine:3450] Intake/Output this shift: Total I/O In: -  Out: 2275 [Urine:2275]  General appearance: alert, cooperative, appears stated age and family at bedside Eyes: conjunctivae/corneas clear. PERRL, EOM's intact. Fundi benign. Nose: Nares normal. Septum midline. Mucosa normal. No drainage or sinus tenderness. Throat: lips,  mucosa, and tongue normal; teeth and gums normal Neck: supple, symmetrical, trachea midline Back: symmetric, no curvature. ROM normal. No CVA tenderness., bilat neph tubes wtih clear urine at present. Non-thick, non-foul.  Resp: non-labored on minimal [place] O2 Cardio: Nl rate Pelvic: external genitalia normal and foley c/d/i with clear yellow urine.  Extremities: extremities normal, atraumatic, no cyanosis or edema Pulses: 2+ and symmetric  Lab Results:   Recent Labs  10/15/15 0735 10/17/15 0438  WBC 10.8* 7.8  HGB 10.5* 10.5*  HCT 31.6* 31.3*  PLT 282 271   BMET  Recent Labs  10/15/15 0735 10/17/15 0438  NA 138 138  K 4.3 3.8  CL 112* 110  CO2 20* 21*  GLUCOSE 164* 157*  BUN 17 16  CREATININE 1.70* 1.44*  CALCIUM 8.3* 8.3*   PT/INR No results for input(s): LABPROT, INR in the last 72 hours. ABG No results for input(s): PHART, HCO3 in the last 72 hours.  Invalid input(s): PCO2, PO2  Studies/Results: No results found.  Anti-infectives: Anti-infectives    Start     Dose/Rate Route Frequency Ordered Stop   10/13/15 1500  cefTRIAXone (ROCEPHIN) 1 g in dextrose 5 % 50 mL IVPB     1 g 100 mL/hr over 30 Minutes Intravenous Every 24 hours 10/13/15 1420     10/11/15 2200  ceFEPIme (MAXIPIME) 1 g in dextrose 5 % 50 mL IVPB  Status:  Discontinued     1 g 100 mL/hr over 30 Minutes Intravenous Every 12 hours 10/11/15 2059 10/11/15 2106   10/11/15 2130  cefTAZidime (FORTAZ) 2 g  in dextrose 5 % 50 mL IVPB  Status:  Discontinued     2 g 100 mL/hr over 30 Minutes Intravenous Every 24 hours 10/11/15 2107 10/13/15 1420   10/11/15 1600  cefTRIAXone (ROCEPHIN) 1 g in dextrose 5 % 50 mL IVPB  Status:  Discontinued     1 g 100 mL/hr over 30 Minutes Intravenous Every 24 hours 10/11/15 1528 10/11/15 2107   10/11/15 1530  cefTRIAXone (ROCEPHIN) 2 g in dextrose 5 % 50 mL IVPB  Status:  Discontinued     2 g 100 mL/hr over 30 Minutes Intravenous  Once 10/11/15 1525 10/11/15 1528       Assessment/Plan:  Proceed as planned today with cysto, retrogrades, bladder BX, Rt ingunal LN biopsy with goal of ruling out recurrence cacner and characterizeing possible bladder-uterine fistula. Risks, benefits, alternatives, expected peri-op course discussed with pt and family x several and she has good undertanding.   [facility], Heidi [name] 10/17/2015

## 2015-10-17 NOTE — Anesthesia Procedure Notes (Signed)
Procedure Name: LMA Insertion Date/Time: 10/17/2015 4:18 PM Performed by: Anne Fu Pre-anesthesia Checklist: Patient identified, Emergency Drugs available, Suction available, Patient being monitored and Timeout performed Patient Re-evaluated:Patient Re-evaluated prior to inductionOxygen Delivery Method: Circle system utilized Preoxygenation: Pre-oxygenation with 100% oxygen Intubation Type: IV induction Ventilation: Mask ventilation without difficulty LMA: LMA inserted LMA Size: 3.0 Number of attempts: 1 Placement Confirmation: positive ETCO2 and breath sounds checked- equal and bilateral Tube secured with: Tape

## 2015-10-18 ENCOUNTER — Encounter (HOSPITAL_COMMUNITY): Payer: Self-pay | Admitting: Urology

## 2015-10-18 DIAGNOSIS — N183 Chronic kidney disease, stage 3 (moderate): Secondary | ICD-10-CM

## 2015-10-18 DIAGNOSIS — N179 Acute kidney failure, unspecified: Secondary | ICD-10-CM

## 2015-10-18 DIAGNOSIS — E440 Moderate protein-calorie malnutrition: Secondary | ICD-10-CM

## 2015-10-18 LAB — GLUCOSE, CAPILLARY
GLUCOSE-CAPILLARY: 136 mg/dL — AB (ref 65–99)
Glucose-Capillary: 123 mg/dL — ABNORMAL HIGH (ref 65–99)

## 2015-10-18 MED ORDER — CEFUROXIME AXETIL 250 MG PO TABS: 250.0000 mg | ORAL_TABLET | Freq: Two times a day (BID) | ORAL | 0 refills | Status: DC

## 2015-10-18 MED ORDER — AMLODIPINE BESYLATE 2.5 MG PO TABS: 2.5000 mg | ORAL_TABLET | Freq: Every day | ORAL | 1 refills | Status: DC

## 2015-10-18 MED ORDER — GLUCERNA SHAKE PO LIQD: 237.0000 mL | Freq: Two times a day (BID) | ORAL | 0 refills | Status: DC

## 2015-10-18 MED ORDER — PANTOPRAZOLE SODIUM 40 MG PO TBEC: 40.0000 mg | DELAYED_RELEASE_TABLET | Freq: Every day | ORAL | 1 refills | Status: DC

## 2015-10-18 NOTE — Care Management Important Message (Signed)
Important Message  Patient Details  Name: Staisha Vicary MRN: [id number] Date of Birth: 1940/12/23   Medicare Important Message Given:  Yes    Loann Quill 10/18/2015, 10:50 AM

## 2015-10-18 NOTE — Op Note (Signed)
NAME:  Heidi [name], HESTERBERG.:  [id number]  MEDICAL RECORD NO.:  [id number]  LOCATION:                               FACILITY:  [facility]  PHYSICIAN:  Alexis Frock, MD     DATE OF BIRTH:  1940/10/25  DATE OF PROCEDURE: 10/17/2015                               OPERATIVE REPORT   DIAGNOSES:  History of cervical cancer, bilateral hydronephrosis, question bladder, urine fistula, right inguinal adenopathy.  PROCEDURES: 1. Cystoscopy with cystogram. 2. Bladder biopsy with fulguration. 3. Ultrasound-guided right inguinal lymph node biopsy.  ESTIMATED BLOOD LOSS:  Nil.  COMPLICATION:  None.  SPECIMENS: 1. Right inguinal lymph node cores for permanent pathology. 2. Bladder biopsy area, near suspect fistula.  DRAINS:  A 22-French Foley catheter to straight drain.  FINDINGS: 1. Palpable non-fixed dominant right inguinal lymph node.  This was     visualized by ultrasound and cores taken. 2. Alternating extreme pallor and erythema of the urinary bladder     consistent with radiation changes and suspect approximately 2-cm     diameter vesicouterine fistula, photodocumentation performed. 3. Cystogram with very wide mouth fistula such as complete drainage     with some Foley catheter in place.  INDICATION:  Heidi [name] is a pleasant, but unfortunate 75 year old lady with history of advanced gynecologic malignancy, status post chemoradiation years before in another state.  She re-presented with acute renal failure, bilateral hydronephrosis several months ago and questionable pelvic recurrence of her disease, similar bilateral nephrostomies since that time, her kidney function has improved.  She re- presented with clinical urosepsis and new fluid in her uterus worrisome for possible vesicouterine fistula.  She is now cleared her systemic infectious parameters.  Options were discussed with the patient including truly palliative protocols versus attempted tissue diagnosis or  new inguinal adenopathy and possible fistula to aid in her decision making.  She wished to proceed.  Informed consent was obtained and placed in the medical record.  PROCEDURE IN DETAIL:  The patient being Heidi [name], was verified. Procedure being cystoscopy with bladder biopsy fulguration and cystogram and inguinal lymph node biopsy was confirmed.  Procedure was carried out and time-out was performed.  Intravenous antibiotics were administered. General LMA anesthesia was introduced.  The patient was placed into a low lithotomy position and sterile field was created by prepping and draping the patient's thighs to her knee, her infraumbilical abdomen, vagina, introitus using iodinated prep.  Initial attention was directed toward inguinal lymph node biopsy, the SonoSite surface ultrasound was used to surface probe.  Attention was directed to the right femoral region corresponding to adenopathy from recent CT scan.  Dominant lymph node was indeed seen, this appeared to be in a plane that was suitable for percutaneous biopsy.  The seminal vessels were also positively identified and quite deep to this.  Using a medial-to-lateral approach, two cores were taken using the 22-mm setting of the biopsy apparatus choosing a medial-to-lateral plane of biopsy to avoid any injury to the seminal vessels, which did not occur ultrasonically.  These cores were setting aside for permanent pathology of the right inguinal adenopathy. Attention was then directed to cystoscopy.  A 23-French rigid cystoscope was used to perform cystourethroscopy.  There was a diffuse alternating extreme  pallor and erythema to the urinary bladder most consistent with radiation changes.  The trigone was then nearly completely obliterated and exchanging the ureteral orifice was very difficult to visualize and they could not be positively identified.  So, retrograde pyelography was not performed.  There was a significant defect  and likely fistulous tract between the bladder and uterus in an area just superior to the left of the trigone.  There was no obvious mounding mass at this location.  The medium-sized cystoscope loop was then used to obtain representative samples from the lateral edges of this fistulous tract, these were setting aside for permanent pathology, labeled as bladder biopsy.  Cystoscope was then exchanged for a 22-French Foley catheter, 10 mL of sterile water in the balloon.  The bladder was initially drained and cystogram was performed.  Cystogram was performed using 100 mL of Cystografin followed by normal saline via a septo.  The bladder had normal contour.  No evidence of reflux.  Obvious fistula track was not seen at this point.  Bladder was drained and this all drained completely.  There was no obvious leakage of contrast or contrast per vagina.  It was felt that most likely represent wide-mouth fistula such that Foley catheterization was able to drain both the uterine and bladder cavities.  This was connected to straight drain.  Procedure was terminated.  The patient tolerated the procedure well.  There were no immediate periprocedural complications. The patient was taken to the postanesthesia care unit in stable condition and plan for transfer back to [facility].    ______________________________ Alexis Frock, MD   ______________________________ Alexis Frock, MD    TM/MEDQ  D:  10/17/2015  T:  10/17/2015  Job:  [id number]

## 2015-10-18 NOTE — Discharge Summary (Signed)
Physician Discharge Summary  Heidi [name] [id number] DOB: 01/22/1941 DOA: 10/11/2015  PCP: No PCP Per Patient  Admit date: 10/11/2015 Discharge date: 10/18/2015  Time spent: 35 minutes  Recommendations for Outpatient Follow-up:  1. Repeat BMET to follow electrolytes and renal function  2. Repeat CBC to follow Hgb trend   Discharge Diagnoses:  Principal Problem:   Sepsis due to urinary tract infection ([place]) Active Problems:   Obstructive uropathy   History of cervical cancer   Renal insufficiency   Hyponatremia   Hypoalbuminemia   Vesicouterine fistula   Diabetes mellitus, type 2 (HCC)   Acute renal failure superimposed on stage 3 chronic kidney disease (HCC)   Moderate protein-calorie malnutrition ([place])   Discharge Condition: stable and improved. Discharge home with family care. Will follow up with Dr. Tresa Moore and Dr. Denman George as an outpatient   Diet recommendation: heart healthy and modified carbohydrates  Filed Weights   10/11/15 2153  Weight: 46.8 kg (103 lb 3.2 oz)    History of present illness:  As per Dr. Tamala Julian H&P written on 10/11/15 75 y.o. 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 History is obtained with use of the Spanish interpreter phone and with the assistant of patient's granddaughter is also present at bedside. The patient started complaining of pain since her last doctor's visit on 6/22, where her nephrostomy tubes were reported to be changed out. Complains of a strong burning pain in the suprapubic and lower abdomen. Pain worsen with urinating and defecating. Associated symptoms include fatigue, generalized weakness, fever up to 101F today, reported intermittent bleeding when wiping after urinating, decreased po  intake, weight loss of 6-7 pounds over the last month, and can only makes a few drops of urine from her bladder when she does urinate since the nephrostomy tubes were first placed back in April 2017. Denies having any gross blood in stool or urine, nausea, vomiting, diarrhea, chest pain, shortness of breath. She states that her nephrostomy sites appear clean and are draining yellow urine.  Patient reports being followed by Dr. Tammi Klippel of urology. Also followed by Dr. Denman George of OB/GYN for cervical cancer history. Patient's last office visit was on 5/22, and review of records shows that imaging studies showed mildly avid nodes and small focus within the endometrial cavity for which repeat PET scan imaging was recommended in 3 months.  Hospital Course:  Sepsis secondary to Proteus urinary tract infection: Acute. Patient reports lower abdominal pain and difficulty urinating and defecating. Patient with fever up to 101F, WBC of 19.4, tachycardia, tachypnea, and UA positive for signs of a UTI. -initially on Ceftazidine; then narrowed to ceftriaxone, base on urine culture and sensitivity. -discharge on ceftin to complete therapy  -continue Tylenol prn fever -continue Hydrocodone prn pain -Foley catheter placed, recommended by urology; after foley place, patient drained white purulent material; resolved by time of discharge  -WBC trended down and WNL at discharge.  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  -Most likely following patient's history of uterine cancer and radiation. Nephrostomy tubes reported to be last changed on 6/22. -IR evaluated patient, nephrostomy tubes functioning well. -will follow up with urology as an outpatient   Question vesicoureteral  fistula/ Question Urethral vs.  Vaginal bleeding: CT found possible fistula formation, right inguinal lymphadenopathy. Family also gives report of intermittent bleeding when wiping self after urinating that could be coming from the bladder, or ureters but would want to make sure. Especially in the setting of recent weight loss and previous CT in 06/2015 noting mildly avid nodes and small focus within the endometrial cavity. Although certain findings could be secondary to the infection as seen above. Patient has been seen by Dr. Denman George of OB/GYN and previously by Dr. Tresa Moore of Urology.  - Transvaginal U/S to evaluate the endometrium showed mass vs hematoma  -urology consulted. Appreciate help and recommendations. Ultrasound finding hematoma vs thickening of bladder.  -discussed with Dr Tresa Moore and Dr Denman George. -S/P for inguinal lymph node biopsy, cystoscopy and bladder biopsy (7/27)  AKI; on chronic stage IV  Creatinine on admission 2.11 with a BUN of 33. Most likely due to acute infection and dehydration. -renal function back to baseline at discharge -Cr 1.4 at discharge -foley catheter in place, and after discussing with urology will keep foley in place until her follow up as an outpatient    -patient advise to follow good hydration   Hyperglycemia with Diabetes mellitus type 2: Acute. Glucose elevated at 323 on admission likely secondary to underlying infection. Patient currently on only oral medications at home. -advise to follow low carbohydrates diet -resume oral hypoglycemic regimen   Anemia: Hemoglobin previously between 9 and 10, but on presentation 8.4. The patient notes the vaginal bleeding with wiping after urination. Patient also found to have blood on urinalysis.  -hb continue trending down and patient was transfused 2 units of PRBC's on 7-23. -Hgb stable since then, and no further hematuria appreciated  -will recommend to repeat CBC to follow Hgb trend   Hyponatremia: Sodium 129 admission. Most likely secondary to  dehydration. -improved with IV fluids.  -will recommend BMET to monitor electrolytes trend   Hypoalbuminemia/moderate protein calorie malnutrition:  -Albumin 2.3 on admission.  -Checked pre-albumin 7.8 -continue Glucerna shakes between meals -reports appetite has been poor lately; encourage to maintain adequate nutrition     Procedures:  Bladder biopsy and inguinal lymph node biopsy 7/27  Consultations:  Urology   GYN oncology   Discharge Exam: Vitals:   10/17/15 2238 10/18/15 0523  BP: (!) 150/59 (!) 156/62  Pulse: 70 68  Resp: 18 16  Temp: 98 F (36.7 C) 98.8 F (37.1 C)   General exam: Appears calm and comfortable; afebrile, denies CP and SOB. Minimal discomfort in her abd and no further hematuria. Respiratory system: Clear to auscultation. Respiratory effort normal. Cardiovascular system: S1 & S2 heard, RRR. No JVD, murmurs, rubs, gallops or clicks. No pedal edema. Gastrointestinal system: Abdomen is nondistended, soft and with just minimal supra-pubic tenderness. No organomegaly or masses felt. Normal bowel sounds heard. Nephrostomy draining clear urine  Central nervous system: Alert and oriented. No focal neurological deficits. Extremities: Symmetric 5 x 5 power. Skin: No rashes, lesions or ulcers Psychiatry: Judgement and insight appear normal. Mood & affect appropriate.    Discharge Instructions   Discharge Instructions    Discharge instructions    Complete by:  As directed   Keep yourself well hydrated  Take medications as prescribed Follow up with Dr. Tresa Moore and Dr. Denman George as instructed (office will contact you with appointment details) Follow modified carb diet     Current Discharge Medication List    START taking these medications   Details  amLODipine (NORVASC) 2.5  MG tablet Take 1 tablet (2.5 mg total) by mouth daily. Qty: 30 tablet, Refills: 1    cefUROXime (CEFTIN) 250 MG tablet Take 1 tablet (250 mg total) by mouth 2 (two) times daily with  a meal. Qty: 10 tablet, Refills: 0    feeding supplement, GLUCERNA SHAKE, (GLUCERNA SHAKE) LIQD Take 237 mLs by mouth 2 (two) times daily between meals. Refills: 0      CONTINUE these medications which have CHANGED   Details  pantoprazole (PROTONIX) 40 MG tablet Take 1 tablet (40 mg total) by mouth daily. Qty: 30 tablet, Refills: 1      CONTINUE these medications which have NOT CHANGED   Details  acetaminophen (TYLENOL) 500 MG tablet Take 500 mg by mouth every 6 (six) hours as needed for mild pain.    glyBURIDE-metformin (GLUCOVANCE) 5-500 MG tablet Take 1 tablet by mouth 2 (two) times daily as needed (for high blood sugar). Blood sugar > 150 then pt takes, if < 95 pt does not take    oxyCODONE-acetaminophen (PERCOCET/ROXICET) 5-325 MG tablet Take 1 tablet by mouth every 6 (six) hours as needed for moderate pain or severe pain. Qty: 30 tablet, Refills: 0       Allergies  Allergen Reactions  . Penicillins Itching, Nausea And Vomiting and Rash    Has patient had a PCN reaction causing immediate rash, facial/tongue/throat swelling, SOB or lightheadedness with hypotension: Yes Has patient had a PCN reaction causing severe rash involving mucus membranes or skin necrosis: No Has patient had a PCN reaction that required hospitalization No Has patient had a PCN reaction occurring within the last 10 years: No If all of the above answers are "NO", then may proceed with Cephalosporin use.    Follow-up Information    Alexis Frock, MD .   Specialty:  Urology Why:  office will contact you with appointment details  Contact information: [place] [address] [phone]        Donaciano Eva, MD .   Specialty:  Obstetrics and Gynecology Why:  office will contact you with appointment details  Contact information: [place] [address] [phone]           The results of significant diagnostics from this hospitalization (including  imaging, microbiology, ancillary and laboratory) are listed below for reference.    Significant Diagnostic Studies: Ct Abdomen Pelvis Wo Contrast  Result Date: 10/11/2015 CLINICAL DATA:  Fever. Pelvic pain. History of cervical cancer. Bilateral hydronephrosis. EXAM: CT ABDOMEN AND PELVIS WITHOUT CONTRAST TECHNIQUE: Multidetector CT imaging of the abdomen and pelvis was performed following the standard protocol without IV contrast. COMPARISON:  PET-CT 08/05/2015.  CT abdomen and pelvis 07/21/2015. FINDINGS: Minimal atelectasis is present in the lung bases. There is no pleural effusion. A punctate calcification in the right hepatic lobe is unchanged. The liver is enlarged, measuring 21 cm in length. The multiple gallstones are again seen. There is no evidence of significant biliary dilatation. The spleen, adrenal glands, and pancreas have an unremarkable unenhanced appearance. Bilateral nephrostomy tubes remain in place. There is moderate to severe bilateral hydronephrosis which has increased from the prior PET-CT. Both ureters are also mildly to moderately dilated diffusely. There is no evidence of bowel obstruction. Diffuse aortoiliac atherosclerosis is noted. Subcentimeter para-aortic lymph nodes are similar to the prior PET-CT. A 1.5 cm right inguinal lymph node is incompletely visualized but has enlarged (series 201, image 84, previously 1.1 cm). Adjacent subcentimeter right inguinal lymph nodes are  also slightly larger. Surgical clips are again noted bilaterally in the pelvis. The bladder is mildly distended with moderate diffuse bladder wall thickening. There is new distension of the endometrial canal by low-density fluid which appears to extend anteriorly through a defect in the uterine wall and may communicate with the posterior bladder. No extraluminal fluid collection/abscess is identified in the pelvis. Soft tissue thickening is again seen in the pelvis likely reflecting the sequelae of prior  radiation therapy. No free fluid is identified. No suspicious lytic blastic osseous lesions are identified. Grade 1 anterolisthesis of L4 on L5 is unchanged. IMPRESSION: 1. Increased, moderate to severe bilateral hydronephrosis with nephrostomy tubes in place. 2. Diffuse bladder wall thickening which may reflect urinary tract infection or be radiation induced. 3. New fluid in the endometrial canal which extends through a defect in the anterior uterine wall and is suspicious for vesicouterine fistula. 4. New right inguinal lymphadenopathy which may be reactive in the setting of infection. Electronically Signed   By: Logan Bores M.D.   On: 10/11/2015 18:54   Dg Chest 2 View  Result Date: 10/11/2015 CLINICAL DATA:  Fever. EXAM: CHEST  2 VIEW COMPARISON:  PET of 08/05/2015. FINDINGS: No prior plain film. Lateral view degraded by patient arm position. Artifact degraded lateral view posteriorly. Mild right hemidiaphragm elevation. Midline trachea. Normal heart size. Atherosclerosis in the transverse aorta. No pleural effusion or pneumothorax. Clear lungs. IMPRESSION: No acute process or explanation for fever. Aortic atherosclerosis. Electronically Signed   By: Abigail Miyamoto M.D.   On: 10/11/2015 17:59   US Transvaginal Non-ob  Result Date: 10/12/2015 CLINICAL DATA:  Vaginal bleeding. History of cervical cancer status post radiation therapy. EXAM: TRANSABDOMINAL AND TRANSVAGINAL ULTRASOUND OF PELVIS TECHNIQUE: Both transabdominal and transvaginal ultrasound examinations of the pelvis were performed. Transabdominal technique was performed for global imaging of the pelvis including uterus, ovaries, adnexal regions, and pelvic cul-de-sac. It was necessary to proceed with endovaginal exam following the transabdominal exam to visualize the endometrium. COMPARISON:  CT abdomen and pelvis earlier today FINDINGS: Uterus Poorly visualized and only seen transabdominally. Due to patient pain and guarding, the uterus could not  be visualized transvaginally and the patient could not tolerate any additional imaging attempts. The uterus measures approximately 9.1 cm in length and 4.1 cm in AP thickness. Endometrium Unable to be evaluated. Right ovary Not visualized. Left ovary Not visualized. Other findings The bladder is diffusely thick-walled as seen on earlier CT and contains complex fluid throughout. There is masslike soft tissue in the posterior aspect of the bladder without internal vascularity demonstrated on color Doppler imaging. IMPRESSION: 1. Limited examination as above with poor visualization of the uterus. 2. Thick-walled bladder containing complex fluid as well as masslike soft tissue posteriorly. This is concerning for neoplasm although hematoma could potentially give a similar appearance. Cystoscopy may be helpful for further assessment. Electronically Signed   By: Logan Bores M.D.   On: 10/12/2015 00:37   US Pelvis Complete  Result Date: 10/12/2015 CLINICAL DATA:  Vaginal bleeding. History of cervical cancer status post radiation therapy. EXAM: TRANSABDOMINAL AND TRANSVAGINAL ULTRASOUND OF PELVIS TECHNIQUE: Both transabdominal and transvaginal ultrasound examinations of the pelvis were performed. Transabdominal technique was performed for global imaging of the pelvis including uterus, ovaries, adnexal regions, and pelvic cul-de-sac. It was necessary to proceed with endovaginal exam following the transabdominal exam to visualize the endometrium. COMPARISON:  CT abdomen and pelvis earlier today FINDINGS: Uterus Poorly visualized and only seen transabdominally. Due to patient pain  and guarding, the uterus could not be visualized transvaginally and the patient could not tolerate any additional imaging attempts. The uterus measures approximately 9.1 cm in length and 4.1 cm in AP thickness. Endometrium Unable to be evaluated. Right ovary Not visualized. Left ovary Not visualized. Other findings The bladder is diffusely  thick-walled as seen on earlier CT and contains complex fluid throughout. There is masslike soft tissue in the posterior aspect of the bladder without internal vascularity demonstrated on color Doppler imaging. IMPRESSION: 1. Limited examination as above with poor visualization of the uterus. 2. Thick-walled bladder containing complex fluid as well as masslike soft tissue posteriorly. This is concerning for neoplasm although hematoma could potentially give a similar appearance. Cystoscopy may be helpful for further assessment. Electronically Signed   By: Logan Bores M.D.   On: 10/12/2015 00:37   Ir Nephrostomy Exchange Left  Addendum Date: 09/19/2015   ADDENDUM REPORT: 09/19/2015 14:17 ADDENDUM: Case was discussed with referring urologist, Dr. Tresa Moore, and the decision was made to pursue routine bilateral percutaneous nephrostomy catheter exchanges until further notice. He will see the patient in follow-up consultation in several months at which time he will determine whether attempted internalization is appropriate. Electronically Signed   By: Sandi Mariscal M.D.   On: 09/19/2015 14:17  Result Date: 09/19/2015 INDICATION: History of cervical cancer, post bilateral percutaneous nephrostomy catheter placement on 07/23/2015. Patient returns to the Interventional Radiology department for routine bilateral percutaneous nephrostomy catheter exchange. EXAM: FLUOROSCOPIC GUIDED BILATERAL SIDED NEPHROSTOMY CATHETER EXCHANGE COMPARISON:  None. CONTRAST:  A total of 15 mL Isovue-300 administered was administered into both collecting systems FLUOROSCOPY TIME:  48 seconds (5 mGy) COMPLICATIONS: None immediate. TECHNIQUE: Informed written consent was obtained from the patient after a discussion of the risks, benefits and alternatives to treatment. Questions regarding the procedure were encouraged and answered. A timeout was performed prior to the initiation of the procedure. The bilateral flanks and external portions of  existing nephrostomy catheters were prepped and draped in the usual sterile fashion. A sterile drape was applied covering the operative field. Maximum barrier sterile technique with sterile gowns and gloves were used for the procedure. A timeout was performed prior to the initiation of the procedure. A pre procedural spot fluoroscopic image was obtained. Beginning with the left-sided nephrostomy, a small amount of contrast was injected via the existing left-sided nephrostomy catheter demonstrating appropriate positioning within the renal pelvis. The existing nephrostomy catheter was cut and cannulated with a Benson wire which was coiled within the renal pelvis. Under intermittent fluoroscopic guidance, the existing nephrostomy catheter was exchanged for a new 10.2 [place] all-purpose drainage catheter. Limited contrast injection confirmed appropriate positioning within the left renal pelvis and a post exchange fluoroscopic image was obtained. The catheter was locked, secured to the skin with an interrupted suture and reconnected to a gravity bag. The identical repeat procedure was repeated for the contralateral right-sided nephrostomy, ultimately allowing successful exchange of a new 10.2 [place] all-purpose drainage catheter with end coiled and locked within the right renal pelvis. Dressings were placed. The patient tolerated the above procedures well without immediate postprocedural complication. FINDINGS: The existing nephrostomy catheters are appropriately positioned and functioning. After successful fluoroscopic guided exchange, new bilateral 10.2 French nephrostomy catheters are coiled and locked within the respective renal pelvises. IMPRESSION: Successful fluoroscopic guided exchange of bilateral 10.2 French percutaneous nephrostomy catheters. Electronically Signed: By: Sandi Mariscal M.D. On: 09/19/2015 13:42   Ir Nephrostomy Exchange Right  Addendum Date: 09/19/2015   ADDENDUM REPORT:  09/19/2015 14:17  ADDENDUM: Case was discussed with referring urologist, Dr. Tresa Moore, and the decision was made to pursue routine bilateral percutaneous nephrostomy catheter exchanges until further notice. He will see the patient in follow-up consultation in several months at which time he will determine whether attempted internalization is appropriate. Electronically Signed   By: Sandi Mariscal M.D.   On: 09/19/2015 14:17  Result Date: 09/19/2015 INDICATION: History of cervical cancer, post bilateral percutaneous nephrostomy catheter placement on 07/23/2015. Patient returns to the Interventional Radiology department for routine bilateral percutaneous nephrostomy catheter exchange. EXAM: FLUOROSCOPIC GUIDED BILATERAL SIDED NEPHROSTOMY CATHETER EXCHANGE COMPARISON:  None. CONTRAST:  A total of 15 mL Isovue-300 administered was administered into both collecting systems FLUOROSCOPY TIME:  48 seconds (5 mGy) COMPLICATIONS: None immediate. TECHNIQUE: Informed written consent was obtained from the patient after a discussion of the risks, benefits and alternatives to treatment. Questions regarding the procedure were encouraged and answered. A timeout was performed prior to the initiation of the procedure. The bilateral flanks and external portions of existing nephrostomy catheters were prepped and draped in the usual sterile fashion. A sterile drape was applied covering the operative field. Maximum barrier sterile technique with sterile gowns and gloves were used for the procedure. A timeout was performed prior to the initiation of the procedure. A pre procedural spot fluoroscopic image was obtained. Beginning with the left-sided nephrostomy, a small amount of contrast was injected via the existing left-sided nephrostomy catheter demonstrating appropriate positioning within the renal pelvis. The existing nephrostomy catheter was cut and cannulated with a Benson wire which was coiled within the renal pelvis. Under intermittent fluoroscopic  guidance, the existing nephrostomy catheter was exchanged for a new 10.2 [place] all-purpose drainage catheter. Limited contrast injection confirmed appropriate positioning within the left renal pelvis and a post exchange fluoroscopic image was obtained. The catheter was locked, secured to the skin with an interrupted suture and reconnected to a gravity bag. The identical repeat procedure was repeated for the contralateral right-sided nephrostomy, ultimately allowing successful exchange of a new 10.2 [place] all-purpose drainage catheter with end coiled and locked within the right renal pelvis. Dressings were placed. The patient tolerated the above procedures well without immediate postprocedural complication. FINDINGS: The existing nephrostomy catheters are appropriately positioned and functioning. After successful fluoroscopic guided exchange, new bilateral 10.2 French nephrostomy catheters are coiled and locked within the respective renal pelvises. IMPRESSION: Successful fluoroscopic guided exchange of bilateral 10.2 French percutaneous nephrostomy catheters. Electronically Signed: By: Sandi Mariscal M.D. On: 09/19/2015 13:42    Microbiology: Recent Results (from the past 240 hour(s))  Blood Culture (routine x 2)     Status: None   Collection Time: 10/11/15  3:56 PM  Result Value Ref Range Status   Specimen Description BLOOD RIGHT FOREARM  Final   Special Requests IN PEDIATRIC BOTTLE 4CC  Final   Culture NO GROWTH 5 DAYS  Final   Report Status 10/16/2015 FINAL  Final  Urine culture     Status: Abnormal   Collection Time: 10/11/15  4:16 PM  Result Value Ref Range Status   Specimen Description URINE, CATHETERIZED  Final   Special Requests NONE  Final   Culture >=100,000 COLONIES/mL PROTEUS MIRABILIS (A)  Final   Report Status 10/13/2015 FINAL  Final   Organism ID, Bacteria PROTEUS MIRABILIS (A)  Final      Susceptibility   Proteus mirabilis - MIC*    AMPICILLIN <=2 SENSITIVE Sensitive      CEFAZOLIN <=4 SENSITIVE Sensitive  CEFTRIAXONE <=1 SENSITIVE Sensitive     CIPROFLOXACIN <=0.25 SENSITIVE Sensitive     GENTAMICIN <=1 SENSITIVE Sensitive     IMIPENEM 4 SENSITIVE Sensitive     NITROFURANTOIN 128 RESISTANT Resistant     TRIMETH/SULFA <=20 SENSITIVE Sensitive     AMPICILLIN/SULBACTAM <=2 SENSITIVE Sensitive     PIP/TAZO <=4 SENSITIVE Sensitive     * >=100,000 COLONIES/mL PROTEUS MIRABILIS  Blood Culture (routine x 2)     Status: None   Collection Time: 10/11/15  4:16 PM  Result Value Ref Range Status   Specimen Description BLOOD LEFT FOREARM  Final   Special Requests BOTTLES DRAWN AEROBIC AND ANAEROBIC 5CC  Final   Culture NO GROWTH 5 DAYS  Final   Report Status 10/16/2015 FINAL  Final     Labs: Basic Metabolic Panel:  Recent Labs Lab 10/12/15 0407 10/13/15 0509 10/14/15 0546 10/15/15 0735 10/17/15 0438  NA 135 135 136 138 138  K 4.1 3.8 3.8 4.3 3.8  CL 111 110 113* 112* 110  CO2 19* 19* 18* 20* 21*  GLUCOSE 126* 66 111* 164* 157*  BUN 29* 24* 18 17 16   CREATININE 2.32* 2.21* 1.85* 1.70* 1.44*  CALCIUM 8.5* 8.4* 8.3* 8.3* 8.3*   CBC:  Recent Labs Lab 10/12/15 0407 10/13/15 0509 10/14/15 0546 10/15/15 0735 10/17/15 0438  WBC 19.7* 21.4* 13.3* 10.8* 7.8  HGB 8.0* 7.7* 9.8* 10.5* 10.5*  HCT 24.0* 24.3* 29.8* 31.6* 31.3*  MCV 92.3 94.2 90.6 91.6 91.5  PLT 257 261 244 282 271   CBG:  Recent Labs Lab 10/17/15 0749 10/17/15 1409 10/17/15 1734 10/17/15 2235 10/18/15 0750  GLUCAP 170* 123* 102* 202* 136*    Signed:  Barton Dubois MD.  [facility] 10/18/2015, 3:32 PM

## 2015-10-18 NOTE — Progress Notes (Signed)
Nsg Discharge Note  Admit Date:  10/11/2015 Discharge date: 10/18/2015   Heidi [name] to be D/C'd Home per MD order.  AVS completed.  Copy for chart, and copy for patient signed, and dated. Patient/caregiver able to verbalize understanding.  Discharge Medication:   Medication List    TAKE these medications   acetaminophen 500 MG tablet Commonly known as:  TYLENOL Take 500 mg by mouth every 6 (six) hours as needed for mild pain.   amLODipine 2.5 MG tablet Commonly known as:  NORVASC Take 1 tablet (2.5 mg total) by mouth daily.   cefUROXime 250 MG tablet Commonly known as:  CEFTIN Take 1 tablet (250 mg total) by mouth 2 (two) times daily with a meal.   feeding supplement (GLUCERNA SHAKE) Liqd Take 237 mLs by mouth 2 (two) times daily between meals.   glyBURIDE-metformin 5-500 MG tablet Commonly known as:  GLUCOVANCE Take 1 tablet by mouth 2 (two) times daily as needed (for high blood sugar). Blood sugar > 150 then pt takes, if < 95 pt does not take   oxyCODONE-acetaminophen 5-325 MG tablet Commonly known as:  PERCOCET/ROXICET Take 1 tablet by mouth every 6 (six) hours as needed for moderate pain or severe pain.   pantoprazole 40 MG tablet Commonly known as:  PROTONIX Take 1 tablet (40 mg total) by mouth daily.       Discharge Assessment: Vitals:   10/17/15 2238 10/18/15 0523  BP: (!) 150/59 (!) 156/62  Pulse: 70 68  Resp: 18 16  Temp: 98 F (36.7 C) 98.8 F (37.1 C)   Skin clean, dry and intact without evidence of skin break down, no evidence of skin tears noted. IV catheter discontinued intact. Site without signs and symptoms of complications - no redness or edema noted at insertion site, patient denies c/o pain - only slight tenderness at site.  Dressing with slight pressure applied.  D/c Instructions-Education: Discharge instructions given to patient/family with verbalized understanding. D/c education completed with patient/family including follow  up instructions, medication list, d/c activities limitations if indicated, with other d/c instructions as indicated by MD - patient able to verbalize understanding, all questions fully answered. Patient instructed to return to ED, call 911, or call MD for any changes in condition.  Patient escorted via [place], and D/C home via private auto.  Salley Slaughter, RN 10/18/2015 4:38 PM

## 2015-10-18 NOTE — Care Management Note (Signed)
Case Management Note  Patient Details  Name: Heidi [name] MRN: [id number] Date of Birth: May 04, 1940  Subjective/Objective:                 Patient admitted with bilateral nephrostomy tubes managed by her family prior to admission. No home health needs identified.    Action/Plan:  Patient to DC to home, in care of her family who has been managing her B neph tubes prior to admission. No new needs requiring HH identified by CM or MD.  Expected Discharge Date:                  Expected Discharge Plan:  Home/Self Care  In-House Referral:     Discharge planning Services  CM Consult  Post Acute Care Choice:  NA Choice offered to:  Patient, Adult Children  DME Arranged:  N/A DME Agency:  NA  HH Arranged:  NA HH Agency:  NA  Status of Service:  Completed, signed off  If discussed at [place] of Stay Meetings, dates discussed:    Additional Comments:  Carles Collet, RN 10/18/2015, 12:20 PM

## 2015-10-20 NOTE — Anesthesia Postprocedure Evaluation (Signed)
Anesthesia Post Note  Patient: Heidi [name]  Procedure(s) Performed: Procedure(s) (LRB): CYSTOGRAM (N/A) CYSTOSCOPY WITH BLADDER BIOPSY AND INGUINAL NODE BIOPSY WITH ULTRASOUND (N/A) CYSTOSCOPY WITH FULGERATION (N/A)  Patient location during evaluation: PACU Anesthesia Type: General Level of consciousness: awake Pain management: pain level controlled Vital Signs Assessment: post-procedure vital signs reviewed and stable Respiratory status: spontaneous breathing Cardiovascular status: stable Postop Assessment: no signs of nausea or vomiting Anesthetic complications: no    Last Vitals:  Vitals:   10/17/15 2238 10/18/15 0523  BP: (!) 150/59 (!) 156/62  Pulse: 70 68  Resp: 18 16  Temp: 36.7 C 37.1 C    Last Pain:  Vitals:   10/18/15 1010  TempSrc:   PainSc: 0-No pain                 Dachelle Molzahn

## 2015-11-01 ENCOUNTER — Encounter (HOSPITAL_COMMUNITY)
Admission: RE | Admit: 2015-11-01 | Discharge: 2015-11-01 | Disposition: A | Discharge status: Home / Self Care | Payer: Medicaid Other | Source: Ambulatory Visit | Attending: Gynecologic Oncology | Admitting: Gynecologic Oncology

## 2015-11-01 ENCOUNTER — Encounter (HOSPITAL_COMMUNITY): Payer: Self-pay | Admitting: Radiology

## 2015-11-01 DIAGNOSIS — Z8541 Personal history of malignant neoplasm of cervix uteri: Secondary | ICD-10-CM | POA: Diagnosis present

## 2015-11-01 LAB — GLUCOSE, CAPILLARY: GLUCOSE-CAPILLARY: 139 mg/dL — AB (ref 65–99)

## 2015-11-01 MED ORDER — FLUDEOXYGLUCOSE F - 18 (FDG) INJECTION: 5.1000 | Freq: Once | INTRAVENOUS | Status: DC | PRN

## 2015-11-06 ENCOUNTER — Encounter: Payer: Self-pay | Admitting: Gynecologic Oncology

## 2015-11-06 ENCOUNTER — Ambulatory Visit: Payer: Medicaid Other | Attending: Gynecologic Oncology | Admitting: Gynecologic Oncology

## 2015-11-06 VITALS — BP 137/54 | HR 79 | Temp 99.1°F | Resp 18 | Ht 62.0 in | Wt 99.1 lb

## 2015-11-06 DIAGNOSIS — Z880 Allergy status to penicillin: Secondary | ICD-10-CM | POA: Insufficient documentation

## 2015-11-06 DIAGNOSIS — N135 Crossing vessel and stricture of ureter without hydronephrosis: Secondary | ICD-10-CM | POA: Diagnosis not present

## 2015-11-06 DIAGNOSIS — N138 Other obstructive and reflux uropathy: Secondary | ICD-10-CM | POA: Insufficient documentation

## 2015-11-06 DIAGNOSIS — N1330 Unspecified hydronephrosis: Secondary | ICD-10-CM | POA: Insufficient documentation

## 2015-11-06 DIAGNOSIS — Z8541 Personal history of malignant neoplasm of cervix uteri: Secondary | ICD-10-CM

## 2015-11-06 DIAGNOSIS — R590 Localized enlarged lymph nodes: Secondary | ICD-10-CM | POA: Insufficient documentation

## 2015-11-06 DIAGNOSIS — E119 Type 2 diabetes mellitus without complications: Secondary | ICD-10-CM | POA: Diagnosis not present

## 2015-11-06 DIAGNOSIS — Z79899 Other long term (current) drug therapy: Secondary | ICD-10-CM | POA: Diagnosis not present

## 2015-11-06 DIAGNOSIS — R599 Enlarged lymph nodes, unspecified: Secondary | ICD-10-CM

## 2015-11-06 DIAGNOSIS — N179 Acute kidney failure, unspecified: Secondary | ICD-10-CM | POA: Diagnosis not present

## 2015-11-06 NOTE — Patient Instructions (Signed)
Plan to follow up in Feb 2018 or sooner if needed.  Please call in October or November 2017 to schedule your appointment for Feb 2018.  Please call for any questions or concerns.

## 2015-11-06 NOTE — Progress Notes (Signed)
CERVICAL CANCER FOLLOW-UP  Consult was initially requested by Dr. Daryll Drown for the evaluation of Heidi [name] 75 y.o. female  CC:        bilateral hydronephrosis History of cervical cancer Soft tissue density in central pelvis on CT imaging and hypermetabolic lymphadenopathy on PET AKI - obstructive nephropathy  Assessment/Plan:  Heidi [name] is a 75 y.o. 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  She has PET avid pelvic and inguinal adenopathy that on imaging is highly suspicious for recurrence. However, on biopsy of the right inguinal lymph node, this is negative for viable disease. There is a possibility that the PET findings are secondary to reactive nodes. Additionally, the patient is not interested in salvage therapy should her cancer recur.  I am recommending no interventions at this time.  I will see her back for surveillance exam in 6 months.  She sees Dr Tresa Moore tomorrow for consideration of removal of foley catheter.  HPI: Heidi [name] is a 75 year old parous woman who is seen in consultation at the request of Dr Gilles Chiquito for pelvic tissue thickening on CT imaging, a history of cervical cancer and bilateral ureteral obstruction.  The patient was interviewed with her grand daughter who translated.  She reports a remote (2003) history of cervical cancer that was treated with primary chemoradiation at [facility] [place] [facility]. She had an ex lap as part of workup of her cancer, but hysterectomy was not performed. She had a complete response to primary therapy and did attend surveillance visits for many years and had no evidence of recurrence.  However, she developed abdominal pain and symptoms of urosepsis in April, 2017 and was admitted to [facility] on 07/20/15. Upon admission her creatinine was noted to be very elevated (3.37). She had  findings of Klebsiella pneumoniae bacteriuria and was treated with antibiotics. She underwent imaging of the abdomen and pelvis (non contrasted CT) which showed: Bilateral hydronephrosis, moderate-to-severe in degree, favored to be chronic given the lack of significant perinephric inflammation. Poorly defined soft tissue mass/thickening within the pelvis, surrounding the bladder and within the bilateral adnexal regions, difficult to definitively characterize without intravascular contrast. This poorly defined soft tissue mass/thickening is the likely source of the bilateral obstructive uropathy. This may represent local spread of neoplastic mass related to the given history of endometrial cancer. Alternatively, this could represent postsurgical and/or post-treatment changes (scarring/fibrosis). As above, there is soft tissue mass/thickening circumferentially about the bladder. This may represent bladder wall thickening or an adjacent soft tissue encasement. Surgical clips are noted along the bilateral pelvic sidewalls. Previous tumor resection?  She underwent bilateral percutaneous nephrostomy tube placement on 07/23/15 and received a consultation with Urologist, Dr Tresa Moore. Her creatinine trended down after placement of the percutaneous nephrostomy tubes.  The patient reports having no vaginal bleeding or abnormal discharge. She denies weight loss.  On May 8th a pap was taken that was unremarkable and her pelvic exam revealed a shortened vagina with no apparent pelvic masses.  On 08/05/15 PET/CT was performed and revealed: minimal hypermetabolism within left periaortic and right inguinal lymph nodes, nonspecific and possibly reactive. Attention on followup exams is warranted. Mild vague hypermetabolism along the left vaginal cuff without a definite CT correlate. Findings may be treatment related.  Interval Hx:  In July, 2017 she was admitted to [facility] with urosepsis. A foley catheter was placed. Imaging  of the abdomen and pelvis suggested  right inguinal adenopathy which was palpable on exam. A biopsy of the right inguinal node revealed benign lymph node tissue.  A follow-up PET scan on 11/01/15 showed enlargement and increased hypermetabolism involving right inguinal nodes suspicious for progressive nodal mets and a persistent non-specific hypermetabolism in the left uterine wall.   She is doing well but strongly desires foley removal. It produces approximately 100-200cc urine per day, with the rest produced via her to PCN's.  She plans to travel to [place] for 6 months.  Meds:  Current Outpatient Prescriptions on File Prior to Visit  Medication Sig Dispense Refill  . acetaminophen (TYLENOL) 500 MG tablet Take 500 mg by mouth every 6 (six) hours as needed for mild pain.    Marland Kitchen amLODipine (NORVASC) 2.5 MG tablet Take 1 tablet (2.5 mg total) by mouth daily. 30 tablet 1  . feeding supplement, GLUCERNA SHAKE, (GLUCERNA SHAKE) LIQD Take 237 mLs by mouth 2 (two) times daily between meals.  0  . glyBURIDE-metformin (GLUCOVANCE) 5-500 MG tablet Take 1 tablet by mouth 2 (two) times daily as needed (for high blood sugar). Blood sugar > 150 then pt takes, if < 95 pt does not take    . pantoprazole (PROTONIX) 40 MG tablet Take 1 tablet (40 mg total) by mouth daily. (Patient not taking: Reported on 11/06/2015) 30 tablet 1   Current Facility-Administered Medications on File Prior to Visit  Medication Dose Route Frequency Provider Last Rate Last Dose  . fludeoxyglucose F - 18 (FDG) injection 5.1 millicurie  5.1 millicurie Intravenous Once PRN Markus Daft, MD         Allergy:  Allergies  Allergen Reactions  . Penicillins Itching, Nausea And Vomiting and Rash    Has patient had a PCN reaction causing immediate rash, facial/tongue/throat swelling, SOB or lightheadedness with hypotension: Yes Has patient had a PCN reaction causing severe rash involving mucus membranes or skin necrosis: No Has patient  had a PCN reaction that required hospitalization No Has patient had a PCN reaction occurring within the last 10 years: No If all of the above answers are "NO", then may proceed with Cephalosporin use.     Social Hx:  Social History   Social History  . Marital Status: Widowed    Spouse Name: N/A  . Number of Children: N/A  . Years of Education: N/A   Occupational History  . Not on file.   Social History Main Topics  . Smoking status: Never Smoker   . Smokeless tobacco: Not on file  . Alcohol Use: Not on file  . Drug Use: Not on file  . Sexual Activity: Not on file   Other Topics Concern  . Not on file   Social History Narrative  . No narrative on file    Past Surgical Hx: History reviewed. Exploratory laparotomy (no hysterectomy) for cervical cancer diagnosis in 2003  Past Medical Hx:  Past Medical History  Diagnosis Date  . Diabetes mellitus without complication ([place])    cervical cancer ("early stage") treated with chemoradiation 2003  Past Gynecological History: Cervical cancer 2003 (chemoradiation) No LMP recorded.  Family Hx: No family history on file. No gyn malignancies  Review of Systems:  Constitutional  Feels improved generally  ENT Normal appearing ears and nares bilaterally Skin/Breast  No rash, sores, jaundice, itching, dryness Cardiovascular  No chest pain, shortness of breath, or edema  Pulmonary  No cough or wheeze.  Gastro Intestinal  No nausea, vomitting, or diarrhoea. No bright red blood per rectum,  no abdominal pain, change in bowel movement, or constipation.  Genito Urinary  No frequency, urgency, dysuria, no bleeding or discharge Musculo Skeletal  No myalgia, arthralgia, joint swelling or pain  Neurologic  No weakness, numbness, change in gait,  Psychology  No depression, anxiety, insomnia.   Vitals: Blood pressure 144/62, pulse 85,  temperature 98.3 F (36.8 C), temperature source Oral, resp. rate 16, height 5' (1.524 m), weight 98 lb 12.3 oz (44.8 kg), SpO2 96 %.  Physical Exam: WD in NAD Neck  Supple NROM, without any enlargements.  Lymph Node Survey No cervical supraclavicular adenopathy. + right inguinal adenopathy Cardiovascular  deferred Lungs  deferred Skin  No rash/lesions/breakdown  Psychiatry  Alert and oriented to person, place, and time  Abdomen  Normoactive bowel sounds, abdomen soft, non-tender and obese without evidence of hernia. tatoo mark from prior radiation. Low transverse incision from prior laparotomy Back No CVA tenderness Genito Urinary  Normal appearing external genitalia. Vagina is very shortened (1-2cm) and with radiation changes (atrophy, thin vaginal tissue, loss of rugation) but with no lesions. Pap taken. Rectal  No distinct mass appreciated in anterior pelvis. Tissues mobile. No nodularity. Extremities  No bilateral cyanosis, clubbing or edema.   Heidi Eva, MD   CC: Dr Tresa Moore

## 2015-11-11 ENCOUNTER — Other Ambulatory Visit: Payer: Self-pay | Admitting: Urology

## 2015-11-11 ENCOUNTER — Encounter (HOSPITAL_COMMUNITY): Payer: Self-pay | Admitting: Interventional Radiology

## 2015-11-11 ENCOUNTER — Ambulatory Visit (HOSPITAL_COMMUNITY)
Admission: RE | Admit: 2015-11-11 | Discharge: 2015-11-11 | Disposition: A | Discharge status: Home / Self Care | Payer: Medicaid Other | Source: Ambulatory Visit | Attending: Urology | Admitting: Urology

## 2015-11-11 DIAGNOSIS — N131 Hydronephrosis with ureteral stricture, not elsewhere classified: Secondary | ICD-10-CM

## 2015-11-11 DIAGNOSIS — Z436 Encounter for attention to other artificial openings of urinary tract: Secondary | ICD-10-CM | POA: Insufficient documentation

## 2015-11-11 DIAGNOSIS — N139 Obstructive and reflux uropathy, unspecified: Secondary | ICD-10-CM | POA: Insufficient documentation

## 2015-11-11 HISTORY — PX: IR GENERIC HISTORICAL: IMG1180011

## 2015-11-11 MED ORDER — LIDOCAINE HCL 1 % IJ SOLN
INTRAMUSCULAR | Status: AC
  Administered 2015-11-11: 6 mL
  Filled 2015-11-11: qty 20

## 2015-11-11 MED ORDER — IOPAMIDOL (ISOVUE-300) INJECTION 61%
INTRAVENOUS | Status: AC
  Administered 2015-11-11: 50 mL
  Filled 2015-11-11: qty 50

## 2015-11-11 NOTE — Procedures (Signed)
Interventional Radiology Procedure Note  Procedure: Bilateral PCN exchange, routine.  Complications: None Recommendations:  - OK to DC - Do not submerge   - Routine care   Signed,  Dulcy Fanny. Earleen Newport, DO

## 2015-11-14 ENCOUNTER — Other Ambulatory Visit (HOSPITAL_COMMUNITY): Payer: Medicaid Other

## 2016-01-10 ENCOUNTER — Encounter (HOSPITAL_COMMUNITY): Payer: Self-pay | Admitting: *Deleted

## 2016-01-10 ENCOUNTER — Emergency Department (HOSPITAL_COMMUNITY): Payer: Medicaid Other

## 2016-01-10 ENCOUNTER — Emergency Department (HOSPITAL_COMMUNITY)
Admission: EM | Admit: 2016-01-10 | Discharge: 2016-01-10 | Disposition: A | Discharge status: Home / Self Care | Payer: Medicaid Other | Attending: Emergency Medicine | Admitting: Emergency Medicine

## 2016-01-10 DIAGNOSIS — Z7984 Long term (current) use of oral hypoglycemic drugs: Secondary | ICD-10-CM | POA: Diagnosis not present

## 2016-01-10 DIAGNOSIS — J069 Acute upper respiratory infection, unspecified: Secondary | ICD-10-CM

## 2016-01-10 DIAGNOSIS — Z8541 Personal history of malignant neoplasm of cervix uteri: Secondary | ICD-10-CM | POA: Diagnosis not present

## 2016-01-10 DIAGNOSIS — Z8542 Personal history of malignant neoplasm of other parts of uterus: Secondary | ICD-10-CM | POA: Insufficient documentation

## 2016-01-10 DIAGNOSIS — N183 Chronic kidney disease, stage 3 (moderate): Secondary | ICD-10-CM | POA: Diagnosis not present

## 2016-01-10 DIAGNOSIS — Z79899 Other long term (current) drug therapy: Secondary | ICD-10-CM | POA: Insufficient documentation

## 2016-01-10 DIAGNOSIS — E1122 Type 2 diabetes mellitus with diabetic chronic kidney disease: Secondary | ICD-10-CM | POA: Insufficient documentation

## 2016-01-10 DIAGNOSIS — R509 Fever, unspecified: Secondary | ICD-10-CM | POA: Diagnosis present

## 2016-01-10 LAB — RAPID STREP SCREEN (MED CTR MEBANE ONLY): Streptococcus, Group A Screen (Direct): NEGATIVE

## 2016-01-10 MED ORDER — ACETAMINOPHEN 325 MG PO TABS
650.0000 mg | ORAL_TABLET | Freq: Once | ORAL | Status: AC | PRN
  Administered 2016-01-10: 650 mg via ORAL
  Filled 2016-01-10: qty 2

## 2016-01-10 NOTE — ED Triage Notes (Signed)
Pt complains of fever, cough, sore throat and nasal congestion for the past 2 days. Pt denies shortness of breath. Pt denies urinary symptoms.

## 2016-01-10 NOTE — ED Provider Notes (Signed)
[facility] DEPT Provider Note   CSN: [id number] Arrival date & time: 01/10/16  1711     History   Chief Complaint Chief Complaint  Patient presents with  . Fever  . Cough  . Nasal Congestion    HPI Heidi [name] is a 75 y.o. female.  Patient presents with cough. Her daughter is translating. She has a history of chronic kidney disease, diabetes, colostomy.  She's had a 2 day history of nasal congestion and coughing. She's had fevers up to 100.4. She denies any shortness of breath. No productive cough. No shortness of breath or chest pain. No leg pain or swelling. No nausea or vomiting. No dizziness.    Fever   Associated symptoms include congestion and cough. Pertinent negatives include no chest pain, no diarrhea, no vomiting and no headaches.  Cough  Associated symptoms include chills and rhinorrhea. Pertinent negatives include no chest pain, no headaches and no shortness of breath.    Past Medical History:  Diagnosis Date  . Diabetes mellitus without complication ([place])   . Uterine cancer [facility])     Patient Active Problem List   Diagnosis Date Noted  . Lymphadenopathy, inguinal 11/06/2015  . Acute renal failure superimposed on stage 3 chronic kidney disease ([place])   . Moderate protein-calorie malnutrition ([place])   . Complicated UTI (urinary tract infection) 10/11/2015  . Sepsis due to urinary tract infection ([place]) 10/11/2015  . Renal insufficiency 10/11/2015  . Hyponatremia 10/11/2015  . Hypoalbuminemia 10/11/2015  . 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 Diabetes mellitus, type 2 ([place]) 10/11/2015  . History of cervical cancer 07/29/2015  . Elevated serum creatinine   . Malignancy ([place])   . Obstructive uropathy   . Hematuria 07/21/2015  . UTI (urinary tract infection) 07/21/2015  . Hydronephrosis determined by ultrasound 07/21/2015  . Symptomatic anemia   . Urinary tract infectious disease   . Severe sepsis ([place]) 07/20/2015    Past Surgical  History:  Procedure Laterality Date  . CYSTOGRAM N/A 10/17/2015   Procedure: CYSTOGRAM;  Surgeon: Alexis Frock, MD;  Location: [facility] ORS;  Service: Urology;  Laterality: N/A;  . CYSTOSCOPY WITH BIOPSY N/A 10/17/2015   Procedure: CYSTOSCOPY WITH BLADDER BIOPSY AND INGUINAL NODE BIOPSY WITH ULTRASOUND;  Surgeon: Alexis Frock, MD;  Location: [facility] ORS;  Service: Urology;  Laterality: N/A;  . CYSTOSCOPY WITH FULGERATION N/A 10/17/2015   Procedure: CYSTOSCOPY WITH FULGERATION;  Surgeon: Alexis Frock, MD;  Location: [facility]S;  Service: Urology;  Laterality: N/A;  . IR GENERIC HISTORICAL  11/11/2015   IR NEPHROSTOMY EXCHANGE RIGHT 11/11/2015 Corrie Mckusick, DO [facility]-INTERV RAD  . IR GENERIC HISTORICAL  11/11/2015   IR NEPHROSTOMY EXCHANGE LEFT 11/11/2015 Corrie Mckusick, DO [facility]INTER[facility]    OB History    No data available       Home Medications    Prior to Admission medications   Medication Sig Start Date End Date Taking? Authorizing Provider  acetaminophen (TYLENOL) 500 MG tablet Take 500 mg by mouth every 6 (six) hours as needed for mild pain.    Historical Provider, MD  amLODipine (NORVASC) 2.5 MG tablet Take 1 tablet (2.5 mg total) by mouth daily. 10/18/15   Barton Dubois, MD  feeding supplement, GLUCERNA SHAKE, (GLUCERNA SHAKE) LIQD Take 237 mLs by mouth 2 (two) times daily between meals. 10/18/15   Barton Dubois, MD  glyBURIDE-metformin (GLUCOVANCE) 5-500 MG tablet Take 1 tablet by mouth 2 (two) times daily as needed (for high blood sugar). Blood sugar > 150 then pt takes,  if < 95 pt does not take    Historical Provider, MD  pantoprazole (PROTONIX) 40 MG tablet Take 1 tablet (40 mg total) by mouth daily. Patient not taking: Reported on 11/06/2015 10/18/15   Barton Dubois, MD    Family History No family history on file.  Social History Social History  Substance Use Topics  . Smoking status: Never Smoker  . Smokeless tobacco: Never Used  . Alcohol use No     Allergies   Penicillins   Review  of Systems Review of Systems  Constitutional: Positive for chills, fatigue and fever. Negative for diaphoresis.  HENT: Positive for congestion, postnasal drip and rhinorrhea. Negative for sneezing.   Eyes: Negative.   Respiratory: Positive for cough. Negative for chest tightness and shortness of breath.   Cardiovascular: Negative for chest pain and leg swelling.  Gastrointestinal: Negative for abdominal pain, blood in stool, diarrhea, nausea and vomiting.  Genitourinary: Negative for difficulty urinating, flank pain, frequency and hematuria.  Musculoskeletal: Negative for arthralgias and back pain.  Skin: Negative for rash.  Neurological: Negative for dizziness, speech difficulty, weakness, numbness and headaches.     Physical Exam Updated Vital Signs BP 112/57 (BP Location: Left Arm)   Pulse 93   Temp 98.2 F (36.8 C) (Oral)   Resp 17   SpO2 98%   Physical Exam  Constitutional: She is oriented to person, place, and time. She appears well-developed and well-nourished.  HENT:  Head: Normocephalic and atraumatic.  Right Ear: External ear normal.  Left Ear: External ear normal.  Mouth/Throat: Oropharynx is clear and moist.  Eyes: Pupils are equal, round, and reactive to light.  Neck: Normal range of motion. Neck supple.  Cardiovascular: Normal rate, regular rhythm and normal heart sounds.   Pulmonary/Chest: Effort normal and breath sounds normal. No respiratory distress. She has no wheezes. She has no rales. She exhibits no tenderness.  Abdominal: Soft. Bowel sounds are normal. There is no tenderness. There is no rebound and no guarding.  Musculoskeletal: Normal range of motion. She exhibits no edema.  Lymphadenopathy:    She has no cervical adenopathy.  Neurological: She is alert and oriented to person, place, and time.  Skin: Skin is warm and dry. No rash noted.  Psychiatric: She has a normal mood and affect.     ED Treatments / Results  Labs (all labs ordered are  listed, but only abnormal results are displayed) Labs Reviewed  RAPID STREP SCREEN (NOT AT [facility])  CULTURE, GROUP A STREP [facility])    EKG  EKG Interpretation None       Radiology Dg Chest 2 View  Result Date: 01/10/2016 CLINICAL DATA:  75 year old female with history of fever, cough, sore throat and nasal congestion for the past 2 days. EXAM: CHEST  2 VIEW COMPARISON:  Chest x-ray 10/11/2015. FINDINGS: Lung volumes are normal. No consolidative airspace disease. No pleural effusions. No pneumothorax. No pulmonary nodule or mass noted. Pulmonary vasculature and the cardiomediastinal silhouette are within normal limits. Aortic atherosclerosis. Bilateral nephrostomy tubes are incompletely imaged, however, based on the appearance of the right nephrostomy tube (pigtail appears poorly formed), the possibility of migration of the tube should be considered. IMPRESSION: 1.  No radiographic evidence of acute cardiopulmonary disease. 2. Aortic atherosclerosis. 3. Clinical correlation for proper function of the right nephrostomy tube is suggested, as the appearance may indicate slight migration of the tube. Electronically Signed   By: Vinnie Langton M.D.   On: 01/10/2016 19:54    Procedures Procedures (  including critical care time)  Medications Ordered in ED Medications  acetaminophen (TYLENOL) tablet 650 mg (650 mg Oral Given 01/10/16 1730)     Initial Impression / Assessment and Plan / ED Course  I have reviewed the triage vital signs and the nursing notes.  Pertinent labs & imaging results that were available during my care of the patient were reviewed by me and considered in my medical decision making (see chart for details).  Clinical Course    Patient has no evidence of pneumonia. She's otherwise well-appearing. I advised her to use Tylenol for fevers and plain Mucinex for symptomatic relief. She was advised to follow-up with her PCP or return here as needed if she has any worsening  symptoms. On chest x-ray there is a question of migration of the right nephrostomy tube. She states that both tubes are draining normally. She does have a scheduled appointment in 3 days to have her tubes changed out.  Final Clinical Impressions(s) / ED Diagnoses   Final diagnoses:  Viral upper respiratory tract infection    New Prescriptions New Prescriptions   No medications on file     Malvin Johns, MD 01/10/16 2030

## 2016-01-13 ENCOUNTER — Other Ambulatory Visit: Payer: Self-pay | Admitting: Urology

## 2016-01-13 ENCOUNTER — Encounter (HOSPITAL_COMMUNITY): Payer: Self-pay | Admitting: Diagnostic Radiology

## 2016-01-13 ENCOUNTER — Ambulatory Visit (HOSPITAL_COMMUNITY)
Admission: RE | Admit: 2016-01-13 | Discharge: 2016-01-13 | Disposition: A | Discharge status: Home / Self Care | Payer: Medicaid Other | Source: Ambulatory Visit | Attending: Urology | Admitting: Urology

## 2016-01-13 DIAGNOSIS — Z8541 Personal history of malignant neoplasm of cervix uteri: Secondary | ICD-10-CM | POA: Diagnosis not present

## 2016-01-13 DIAGNOSIS — N131 Hydronephrosis with ureteral stricture, not elsewhere classified: Secondary | ICD-10-CM

## 2016-01-13 DIAGNOSIS — Z436 Encounter for attention to other artificial openings of urinary tract: Secondary | ICD-10-CM | POA: Diagnosis present

## 2016-01-13 HISTORY — PX: IR GENERIC HISTORICAL: IMG1180011

## 2016-01-13 LAB — CULTURE, GROUP A STREP (THRC)

## 2016-01-13 MED ORDER — LIDOCAINE HCL 1 % IJ SOLN
INTRAMUSCULAR | Status: AC
Start: 2016-01-13 — End: 2016-01-13
  Filled 2016-01-13: qty 20

## 2016-01-13 MED ORDER — IOPAMIDOL (ISOVUE-300) INJECTION 61%
INTRAVENOUS | Status: AC
  Administered 2016-01-13: 10 mL
  Filled 2016-01-13: qty 50

## 2016-01-13 MED ORDER — LIDOCAINE HCL 1 % IJ SOLN
INTRAMUSCULAR | Status: DC | PRN
  Administered 2016-01-13: 5 mL

## 2016-01-13 NOTE — Procedures (Signed)
Routine exchange of bilateral nephrostomy tubes.  No immediate complication.

## 2016-01-14 ENCOUNTER — Encounter (HOSPITAL_COMMUNITY): Payer: Self-pay | Admitting: Diagnostic Radiology

## 2016-03-09 ENCOUNTER — Other Ambulatory Visit: Payer: Self-pay | Admitting: Urology

## 2016-03-09 ENCOUNTER — Ambulatory Visit (HOSPITAL_COMMUNITY)
Admission: RE | Admit: 2016-03-09 | Discharge: 2016-03-09 | Disposition: A | Discharge status: Home / Self Care | Payer: Medicaid Other | Source: Ambulatory Visit | Attending: Urology | Admitting: Urology

## 2016-03-09 ENCOUNTER — Encounter (HOSPITAL_COMMUNITY): Payer: Self-pay | Admitting: Interventional Radiology

## 2016-03-09 DIAGNOSIS — N131 Hydronephrosis with ureteral stricture, not elsewhere classified: Secondary | ICD-10-CM

## 2016-03-09 DIAGNOSIS — Z436 Encounter for attention to other artificial openings of urinary tract: Secondary | ICD-10-CM | POA: Insufficient documentation

## 2016-03-09 HISTORY — PX: IR GENERIC HISTORICAL: IMG1180011

## 2016-03-09 MED ORDER — LIDOCAINE HCL 1 % IJ SOLN
INTRAMUSCULAR | Status: DC | PRN
  Administered 2016-03-09: 10 mL

## 2016-03-09 MED ORDER — LIDOCAINE HCL (PF) 1 % IJ SOLN
INTRAMUSCULAR | Status: AC
  Filled 2016-03-09: qty 30

## 2016-03-09 MED ORDER — IOPAMIDOL (ISOVUE-300) INJECTION 61%
INTRAVENOUS | Status: AC
  Administered 2016-03-09: 20 mL
  Filled 2016-03-09: qty 50

## 2016-05-04 ENCOUNTER — Ambulatory Visit (HOSPITAL_COMMUNITY)
Admission: RE | Admit: 2016-05-04 | Discharge: 2016-05-04 | Disposition: A | Discharge status: Home / Self Care | Payer: Medicaid Other | Source: Ambulatory Visit | Attending: Urology | Admitting: Urology

## 2016-05-04 ENCOUNTER — Other Ambulatory Visit: Payer: Self-pay | Admitting: Urology

## 2016-05-04 ENCOUNTER — Encounter (HOSPITAL_COMMUNITY): Payer: Self-pay | Admitting: Interventional Radiology

## 2016-05-04 DIAGNOSIS — N131 Hydronephrosis with ureteral stricture, not elsewhere classified: Secondary | ICD-10-CM

## 2016-05-04 DIAGNOSIS — N1330 Unspecified hydronephrosis: Secondary | ICD-10-CM | POA: Diagnosis not present

## 2016-05-04 DIAGNOSIS — Z436 Encounter for attention to other artificial openings of urinary tract: Secondary | ICD-10-CM | POA: Insufficient documentation

## 2016-05-04 HISTORY — PX: IR GENERIC HISTORICAL: IMG1180011

## 2016-05-04 MED ORDER — LIDOCAINE HCL (PF) 1 % IJ SOLN
INTRAMUSCULAR | Status: AC
  Filled 2016-05-04: qty 30

## 2016-05-04 MED ORDER — IOPAMIDOL (ISOVUE-300) INJECTION 61%
INTRAVENOUS | Status: AC
  Administered 2016-05-04: 15 mL
  Filled 2016-05-04: qty 50

## 2016-05-04 MED ORDER — LIDOCAINE HCL 1 % IJ SOLN
INTRAMUSCULAR | Status: DC | PRN
  Administered 2016-05-04: 15 mL

## 2016-05-04 NOTE — Procedures (Signed)
Pre Procedure Dx: Hydronephrosis Post Procedure Dx: Same  Successful bilateral PCN exchange.    EBL: None   No immediate complications.   Jay Sheriff Rodenberg, MD Pager #: [phone]  

## 2016-05-06 ENCOUNTER — Other Ambulatory Visit (HOSPITAL_COMMUNITY): Payer: Self-pay | Admitting: Interventional Radiology

## 2016-05-06 DIAGNOSIS — N131 Hydronephrosis with ureteral stricture, not elsewhere classified: Secondary | ICD-10-CM

## 2016-05-07 ENCOUNTER — Ambulatory Visit (HOSPITAL_COMMUNITY): Admission: RE | Admit: 2016-05-07 | Payer: Medicaid Other | Source: Ambulatory Visit

## 2016-06-29 ENCOUNTER — Encounter (HOSPITAL_COMMUNITY): Payer: Self-pay | Admitting: Interventional Radiology

## 2016-06-29 ENCOUNTER — Other Ambulatory Visit: Payer: Self-pay | Admitting: Urology

## 2016-06-29 ENCOUNTER — Ambulatory Visit (HOSPITAL_COMMUNITY)
Admission: RE | Admit: 2016-06-29 | Discharge: 2016-06-29 | Disposition: A | Discharge status: Home / Self Care | Payer: Medicaid Other | Source: Ambulatory Visit | Attending: Urology | Admitting: Urology

## 2016-06-29 DIAGNOSIS — N131 Hydronephrosis with ureteral stricture, not elsewhere classified: Secondary | ICD-10-CM

## 2016-06-29 DIAGNOSIS — Z436 Encounter for attention to other artificial openings of urinary tract: Secondary | ICD-10-CM | POA: Diagnosis not present

## 2016-06-29 DIAGNOSIS — Z8541 Personal history of malignant neoplasm of cervix uteri: Secondary | ICD-10-CM | POA: Diagnosis not present

## 2016-06-29 DIAGNOSIS — Z923 Personal history of irradiation: Secondary | ICD-10-CM | POA: Diagnosis not present

## 2016-06-29 HISTORY — PX: IR NEPHROSTOMY EXCHANGE RIGHT: IMG6070

## 2016-06-29 HISTORY — PX: IR NEPHROSTOMY EXCHANGE LEFT: IMG6069

## 2016-06-29 MED ORDER — LIDOCAINE HCL 1 % IJ SOLN
INTRAMUSCULAR | Status: AC
Start: 2016-06-29 — End: 2016-06-29
  Filled 2016-06-29: qty 20

## 2016-06-29 MED ORDER — IOPAMIDOL (ISOVUE-300) INJECTION 61%
INTRAVENOUS | Status: AC
  Administered 2016-06-29: 15 mL
  Filled 2016-06-29: qty 50

## 2016-06-29 NOTE — Procedures (Signed)
Interventional Radiology Procedure Note  Procedure: Routine exchange of bilateral indwelling percutaneous nephrostomy tubes.   Findings: No hydronephrosis.  Both the right and l;eft ureters drain into the urinary bladder.   Complications: None  Estimated Blood Loss: None  Recommendations: - Will discuss potential for internalization with Dr. Tresa Moore - Tentatively schedule for next routine exchange in 8 weeks   Signed,  Criselda Peaches, MD

## 2016-07-03 ENCOUNTER — Encounter (HOSPITAL_COMMUNITY): Payer: Self-pay | Admitting: Interventional Radiology

## 2016-07-03 ENCOUNTER — Other Ambulatory Visit (HOSPITAL_COMMUNITY): Payer: Self-pay | Admitting: Interventional Radiology

## 2016-07-03 ENCOUNTER — Ambulatory Visit (HOSPITAL_COMMUNITY)
Admission: RE | Admit: 2016-07-03 | Discharge: 2016-07-03 | Disposition: A | Discharge status: Home / Self Care | Payer: Medicaid Other | Source: Ambulatory Visit | Attending: Interventional Radiology | Admitting: Interventional Radiology

## 2016-07-03 DIAGNOSIS — N1339 Other hydronephrosis: Secondary | ICD-10-CM

## 2016-07-03 DIAGNOSIS — N1330 Unspecified hydronephrosis: Secondary | ICD-10-CM | POA: Insufficient documentation

## 2016-07-03 DIAGNOSIS — Z436 Encounter for attention to other artificial openings of urinary tract: Secondary | ICD-10-CM | POA: Diagnosis present

## 2016-07-03 HISTORY — PX: IR NEPHROSTOMY EXCHANGE LEFT: IMG6069

## 2016-07-03 MED ORDER — IOPAMIDOL (ISOVUE-300) INJECTION 61%
INTRAVENOUS | Status: AC
  Administered 2016-07-03: 10 mL
  Filled 2016-07-03: qty 50

## 2016-07-03 NOTE — Procedures (Signed)
Pre Procedure Dx: Hydronephrosis Post Procedure Dx: Same  Successful left sided PCN exchange.    EBL: None   No immediate complications.   Jay Cora Brierley, MD Pager #: [phone]   

## 2016-08-21 ENCOUNTER — Other Ambulatory Visit: Payer: Self-pay | Admitting: Student

## 2016-08-24 ENCOUNTER — Other Ambulatory Visit: Payer: Self-pay | Admitting: Urology

## 2016-08-24 ENCOUNTER — Ambulatory Visit (HOSPITAL_COMMUNITY)
Admission: RE | Admit: 2016-08-24 | Discharge: 2016-08-24 | Disposition: A | Discharge status: Home / Self Care | Payer: Medicaid Other | Source: Ambulatory Visit | Attending: Urology | Admitting: Urology

## 2016-08-24 ENCOUNTER — Encounter (HOSPITAL_COMMUNITY): Payer: Self-pay | Admitting: Interventional Radiology

## 2016-08-24 DIAGNOSIS — C55 Malignant neoplasm of uterus, part unspecified: Secondary | ICD-10-CM | POA: Insufficient documentation

## 2016-08-24 DIAGNOSIS — N131 Hydronephrosis with ureteral stricture, not elsewhere classified: Secondary | ICD-10-CM

## 2016-08-24 DIAGNOSIS — Z436 Encounter for attention to other artificial openings of urinary tract: Secondary | ICD-10-CM | POA: Diagnosis not present

## 2016-08-24 HISTORY — PX: IR NEPHROSTOMY EXCHANGE RIGHT: IMG6070

## 2016-08-24 HISTORY — PX: IR NEPHROSTOMY EXCHANGE LEFT: IMG6069

## 2016-08-24 MED ORDER — LIDOCAINE HCL 1 % IJ SOLN
INTRAMUSCULAR | Status: AC
  Filled 2016-08-24: qty 20

## 2016-08-24 MED ORDER — IOPAMIDOL (ISOVUE-300) INJECTION 61%
INTRAVENOUS | Status: AC
  Administered 2016-08-24: 15 mL
  Filled 2016-08-24: qty 50

## 2016-10-28 ENCOUNTER — Ambulatory Visit (HOSPITAL_COMMUNITY)
Admission: RE | Admit: 2016-10-28 | Discharge: 2016-10-28 | Disposition: A | Discharge status: Home / Self Care | Payer: Medicaid Other | Source: Ambulatory Visit | Attending: Urology | Admitting: Urology

## 2016-10-28 ENCOUNTER — Other Ambulatory Visit (HOSPITAL_COMMUNITY): Payer: Self-pay | Admitting: Interventional Radiology

## 2016-10-28 ENCOUNTER — Encounter (HOSPITAL_COMMUNITY): Payer: Self-pay | Admitting: Interventional Radiology

## 2016-10-28 ENCOUNTER — Other Ambulatory Visit: Payer: Self-pay | Admitting: Urology

## 2016-10-28 DIAGNOSIS — N131 Hydronephrosis with ureteral stricture, not elsewhere classified: Secondary | ICD-10-CM

## 2016-10-28 DIAGNOSIS — Z8542 Personal history of malignant neoplasm of other parts of uterus: Secondary | ICD-10-CM | POA: Insufficient documentation

## 2016-10-28 DIAGNOSIS — Z436 Encounter for attention to other artificial openings of urinary tract: Secondary | ICD-10-CM | POA: Insufficient documentation

## 2016-10-28 DIAGNOSIS — N059 Unspecified nephritic syndrome with unspecified morphologic changes: Secondary | ICD-10-CM

## 2016-10-28 HISTORY — PX: IR NEPHROSTOMY EXCHANGE RIGHT: IMG6070

## 2016-10-28 HISTORY — PX: IR NEPHROSTOMY EXCHANGE LEFT: IMG6069

## 2016-10-28 MED ORDER — IOPAMIDOL (ISOVUE-300) INJECTION 61%
INTRAVENOUS | Status: AC
  Administered 2016-10-28: 15 mL
  Filled 2016-10-28: qty 50

## 2016-10-28 MED ORDER — LIDOCAINE HCL (PF) 1 % IJ SOLN
INTRAMUSCULAR | Status: AC
  Filled 2016-10-28: qty 30

## 2016-12-28 ENCOUNTER — Encounter (HOSPITAL_COMMUNITY): Payer: Self-pay | Admitting: Interventional Radiology

## 2016-12-28 ENCOUNTER — Other Ambulatory Visit (HOSPITAL_COMMUNITY): Payer: Self-pay | Admitting: Interventional Radiology

## 2016-12-28 ENCOUNTER — Ambulatory Visit (HOSPITAL_COMMUNITY)
Admission: RE | Admit: 2016-12-28 | Discharge: 2016-12-28 | Disposition: A | Discharge status: Home / Self Care | Payer: Medicaid Other | Source: Ambulatory Visit | Attending: Interventional Radiology | Admitting: Interventional Radiology

## 2016-12-28 DIAGNOSIS — N135 Crossing vessel and stricture of ureter without hydronephrosis: Secondary | ICD-10-CM | POA: Insufficient documentation

## 2016-12-28 DIAGNOSIS — N059 Unspecified nephritic syndrome with unspecified morphologic changes: Secondary | ICD-10-CM

## 2016-12-28 DIAGNOSIS — Z436 Encounter for attention to other artificial openings of urinary tract: Secondary | ICD-10-CM | POA: Diagnosis present

## 2016-12-28 HISTORY — PX: IR NEPHROSTOMY EXCHANGE RIGHT: IMG6070

## 2016-12-28 HISTORY — PX: IR NEPHROSTOMY EXCHANGE LEFT: IMG6069

## 2016-12-28 MED ORDER — IOPAMIDOL (ISOVUE-300) INJECTION 61%
INTRAVENOUS | Status: AC
  Filled 2016-12-28: qty 50

## 2016-12-28 MED ORDER — LIDOCAINE HCL 1 % IJ SOLN
INTRAMUSCULAR | Status: DC
Start: 2016-12-28 — End: 2016-12-28
  Filled 2016-12-28: qty 20

## 2016-12-28 MED ORDER — IOPAMIDOL (ISOVUE-300) INJECTION 61%
INTRAVENOUS | Status: AC
  Administered 2016-12-28: 15 mL
  Filled 2016-12-28: qty 50

## 2016-12-28 MED ORDER — LIDOCAINE HCL 1 % IJ SOLN
INTRAMUSCULAR | Status: AC
  Filled 2016-12-28: qty 20

## 2016-12-28 NOTE — Procedures (Signed)
Interventional Radiology Procedure Note  Procedure: Routine exchange of bilateral PCN.  New 27F bilateral to gravity.   Findings:  The left PCN has significant debris, and the benson wire became lodged in debris.  Successful exchange with stiff glide.    Complications: None  Recommendations:  - Routine exchange in 6 weeks.  - to gravity drain - Routine drain care   Signed,  Dulcy Fanny. Earleen Newport, DO

## 2017-02-05 ENCOUNTER — Encounter (HOSPITAL_COMMUNITY): Payer: Self-pay

## 2017-02-05 ENCOUNTER — Ambulatory Visit (HOSPITAL_COMMUNITY): Admission: RE | Admit: 2017-02-05 | Payer: Medicaid Other | Source: Ambulatory Visit

## 2017-02-08 ENCOUNTER — Other Ambulatory Visit (HOSPITAL_COMMUNITY): Payer: Self-pay | Admitting: Interventional Radiology

## 2017-02-08 ENCOUNTER — Ambulatory Visit (HOSPITAL_COMMUNITY)
Admission: RE | Admit: 2017-02-08 | Discharge: 2017-02-08 | Disposition: A | Discharge status: Home / Self Care | Payer: Medicaid Other | Source: Ambulatory Visit | Attending: Interventional Radiology | Admitting: Interventional Radiology

## 2017-02-08 ENCOUNTER — Other Ambulatory Visit (HOSPITAL_COMMUNITY): Payer: Medicaid Other

## 2017-02-08 ENCOUNTER — Encounter (HOSPITAL_COMMUNITY): Payer: Self-pay | Admitting: Interventional Radiology

## 2017-02-08 DIAGNOSIS — N135 Crossing vessel and stricture of ureter without hydronephrosis: Secondary | ICD-10-CM | POA: Diagnosis present

## 2017-02-08 DIAGNOSIS — N059 Unspecified nephritic syndrome with unspecified morphologic changes: Secondary | ICD-10-CM

## 2017-02-08 HISTORY — PX: IR NEPHROSTOMY PLACEMENT LEFT: IMG6063

## 2017-02-08 HISTORY — PX: IR NEPHROSTOMY PLACEMENT RIGHT: IMG6064

## 2017-02-08 MED ORDER — CHLORHEXIDINE GLUCONATE 4 % EX LIQD
CUTANEOUS | Status: AC
  Filled 2017-02-08: qty 15

## 2017-02-08 MED ORDER — OXYCODONE-ACETAMINOPHEN 5-325 MG PO TABS
1.0000 | ORAL_TABLET | Freq: Once | ORAL | Status: AC
  Administered 2017-02-08: 1 via ORAL
  Filled 2017-02-08: qty 1

## 2017-02-08 MED ORDER — LIDOCAINE HCL (PF) 1 % IJ SOLN
INTRAMUSCULAR | Status: DC | PRN
  Administered 2017-02-08: 20 mL

## 2017-02-08 MED ORDER — SODIUM CHLORIDE 0.9% FLUSH: 5.0000 mL | Freq: Three times a day (TID) | INTRAVENOUS | Status: DC

## 2017-02-08 MED ORDER — LIDOCAINE HCL 1 % IJ SOLN
INTRAMUSCULAR | Status: AC
  Filled 2017-02-08: qty 20

## 2017-02-08 MED ORDER — IOPAMIDOL (ISOVUE-300) INJECTION 61%
INTRAVENOUS | Status: AC
  Administered 2017-02-08: 30 mL
  Filled 2017-02-08: qty 50

## 2017-02-08 MED ORDER — OXYCODONE-ACETAMINOPHEN 5-325 MG PO TABS
ORAL_TABLET | ORAL | Status: AC
  Filled 2017-02-08: qty 1

## 2017-02-08 NOTE — Discharge Instructions (Signed)
Nefrostoma percutnea: gua para el hogar (Percutaneous Nephrostomy Home Guide) Un tubo de nefrostoma permite eliminar la orina cuando una afeccin impide que el rin la elimine normalmente. La orina es transportada normalmente desde los riones hasta la vejiga a travs de tubos estrechos llamados urteres. El urter se puede obstruir debido a afecciones como clculos renales, tumores, infecciones o cogulos sanguneos. Un tubo de nefrostoma es un tubo hueco y flexible que se coloca en el rin para restaurar el flujo de [place]. El tubo se coloca del lado derecho o izquierdo, en la parte inferior de la [place], y se conecta a una bolsa de drenaje externa. HIGIENE PERSONAL  Puede ducharse, a menos que el mdico le haya indicado lo contrario. Para hacerlo, recubra el vendaje del tubo de nefrostoma con [place] de plstico.  Despus de ducharse, cambie el vendaje de inmediato. Asegrese de que la piel alrededor del punto de salida del tubo de nefrostoma est seca.  Evite sumergir el tubo de nefrostoma en agua, como tomar un bao de inmersin o Designer, television/film set. CUIDADOS DEL TUBO DE NEFROSTOMA  El tubo de nefrostoma se conecta a una bolsa que se sujeta a la pierna o a una bolsa de drenaje que est al costado de la cama. Mantenga siempre el tubo, la bolsa de la pierna o la bolsa de drenaje nocturna por debajo del nivel del rin, para que la orina drene fcilmente.  Sus actividades no se ven limitadas, siempre y cuando no tire del tubo.  [place], si conecta el tubo de nefrostoma a una bolsa en la pierna, asegrese de que el tubo no est doblado y que la orina drene con facilidad. Una tcnica til para evitar que el tubo de nefrostoma se doble o desconecte es colocar una venda elstica alrededor del tubo. Esto ayudar a Designer, industrial/product. Asegrese de que no haya tensin en el tubo de modo que no se desconecte.  Por la noche, posiblemente quiera conectar el tubo de  nefrostoma a una bolsa de drenaje nocturna ms grande ubicada al costado de la cama. CMO VACIAR LA BOLSA EN LA PIERNA O LA BOLSA DE DRENAJE NOCTURNA  La bolsa de la pierna o la bolsa de drenaje nocturna se deben vaciar cuando se llenan y antes de irse a dormir. La mayora de las bolsas tiene un tubo de drenaje en la parte inferior que permite que la orina sea excretada. 1. Sostenga la bolsa de la pierna o la bolsa de drenaje nocturna por encima del inodoro o recipiente de recoleccin. Use un recipiente medidor si recibi instrucciones de [employer]. 2. Sherlon Handing el tubo de drenaje y permita que la orina drene. 3. Una vez que se haya vaciado la bolsa, cierre el tubo de drenaje completamente para evitar que se filtre la [place]. 4. Tire la cadena del inodoro. Si se [place] un recipiente de recoleccin, enjuguelo. CUIDADOS DEL PUNTO DE SALIDA DEL TUBO DE NEFROSTOMA El punto de salida del tubo de nefrostoma se cubre con un apsito (vendaje). Limpie el punto de salida y [place] el vendaje, segn las indicaciones del mdico o si el vendaje se moja. Materiales necesarios:  Jabn suave y [place].  Gasas divididas de 4 x 4pulgadas (10 x 10cm).  Gasas de 4 x 4pulgadas (10 x 10cm).  Cinta adhesiva de papel. Cuidados del punto de salida y cambio de vendajes: durante las 2 primeras semanas luego de la insercin del tubo de [place], debe cambiar el vendaje todos [place].  Despus de 2semanas, puede cambiar el vendaje 2 veces por semana, cuando se moje o segn las indicaciones del mdico. Debido a la ubicacin del tubo de [place], probablemente necesite la ayuda de otra persona para Quarry manager los vendajes. Los pasos para cambiar un vendaje son los siguientes: 1. Lvese bien las manos con agua y [place]. 2. Retire con cuidado las cintas y los vendajes de alrededor del tubo de nefrostoma. Tenga cuidado de no tirar del tubo mientras retira los vendajes. Evite usar tijeras para retirar el vendaje, ya que se  podra daar accidentalmente el tubo. 3. Lave la piel alrededor del tubo con el jabn [place] y [place], enjuague bien y seque con un pao limpio. 4. Inspeccione la piel alrededor del tubo de drenaje para asegurarse de que no haya enrojecimiento, hinchazn ni una secrecin de color amarillo o verde de mal olor. 5. Si el drenaje est suturado a la piel, inspeccione la sutura para verificar que todava est sujetado a la piel. 6. [employer] gasas divididas en el punto de salida del tubo y alrededor de [place]. No aplique ungentos ni alcohol en el lugar. 7. Coloque una gasa comn encima de la gasa dividida. 8. Enrosque el tubo por encima de la gasa. El tubo debe apoyarse en la gasa y no en la piel. 9. Coloque cinta alrededor de cada extremo de la gasa. 10. Fije el tubo de nefrostoma. Asegrese de que no est doblado ni pinzado. El tubo debe apoyarse en la gasa y no en la piel. 11. Deseche adecuadamente los materiales usados. CMO PURGAR EL TUBO DE NEFROSTOMA  Purgue el tubo de nefrostoma segn las indicaciones del mdico. Purgar un tubo de nefrostoma es ms fcil si se coloca una llave de tres vas entre el tubo y la bolsa de la pierna o la bolsa de drenaje nocturna. Una de las conexiones de la llave de tres vas se conecta al tubo, la segunda se conecta a la bolsa de la pierna o a la bolsa de drenaje nocturna, y la tercera conexin generalmente se sella con un tapn. El mango de la llave de tres vas apunta hacia el flujo cerrado de la llave. Normalmente, el mango apunta hacia el tapn para permitir el drenaje de la orina desde el tubo hasta la bolsa de la pierna o la bolsa de drenaje nocturna. Materiales necesarios:  Pao con alcohol para fricciones.  Jeringa con 20ml de solucin salina al 0,9%. Cmo purgar el tubo de nefrostoma 1. Rena los materiales necesarios. 2. Mueva el mango de la llave de tres vas hasta que apunte hacia la bolsa de la pierna o a la bolsa de drenaje nocturna. 3. Limpie el  tapn con un pao embebido en alcohol para fricciones y luego enrosque la punta de [place] con 61ml de solucin salina al 0,9% en la tapa. 4. Use el mbolo de la [place] y administre los 51ml de solucin salina al 0,9% de la jeringa durante 5 a 10segundos. Si siente resistencia o dolor durante la administracin, suspndala inmediatamente. 5. Retire la jeringa del tapn. 6. Regrese el mango de la llave a su posicin habitual, es decir, apuntando [employer] tapn. 7. Deseche adecuadamente los materiales usados. CMO REEMPLAZAR LA BOLSA DE LA PIERNA O LA BOLSA DE DRENAJE NOCTURNA  Reemplace la bolsa de la pierna, la bolsa de drenaje nocturna, la llave de tres vas y Higher education careers adviser tubo de extensin segn las indicaciones del mdico. Asegrese de tener siempre disponibles una bolsa de drenaje y tubos de conexin  adicionales. 1. Vace la orina de la bolsa de la pierna o la bolsa de drenaje nocturna. 2. Rena la bolsa de la pierna o la bolsa de drenaje nocturna, la llave de tres vas y los tubos de extensin nuevos. 3. Retire la bolsa de la pierna o la bolsa de drenaje nocturna, la llave de tres vas y cualquier tubo de extensin del tubo de nefrostoma. 4. Conecte la bolsa de la pierna o la bolsa de drenaje nocturna, la llave de tres vas y cualquier tubo de extensin nuevos al tubo de nefrostoma. 5. Deseche la bolsa de la pierna o la bolsa de drenaje nocturna, la llave de tres vas y cualquier tubo de extensin del tubo de nefrostoma que estn usados. SOLICITE ATENCIN MDICA DE INMEDIATO SI:  Tiene fiebre.  Tiene dolor abdominal durante la primera semana despus de la insercin del tubo de nefrostoma.  Observa nuevamente, sangre en la orina.  Observa secrecin, enrojecimiento o hinchazn en el lugar de la insercin del tubo de nefrostoma.  Tiene dificultad o dolor al purgar el tubo.  Nota una disminucin en la diuresis que no tiene explicacin ya que no disminuy la cantidad de lquido que  bebe.  El tubo de nefrostoma se sale o la sutura que fija el tubo se suelta. Esta informacin no tiene Marine scientist el consejo del mdico. Asegrese de hacerle al mdico cualquier pregunta que tenga. Document Released: 12/28/2012 Document Revised: 03/14/2013 Elsevier Interactive Patient Education  2017 [employer].

## 2017-02-08 NOTE — Sedation Documentation (Signed)
Taking PO's without problem, denies pain.  Right PCN draining blood tinged urine but seems to be clearing.  Left PCN clear yellow urine.  Daughter at bedside

## 2017-02-08 NOTE — Procedures (Signed)
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 Post Procedural Dx: Same  Successful [place] and fluoroscopic guided placement of a new right sided PCN with end coiled and locked within the renal pelvis. Successful fluoro guided recanalization of chronic left sided PCN track.  Both PCNs connected to gravity bags.  EBL: Minimal  Complications: None immediate.  Ronny Bacon, MD Pager #: [phone]

## 2017-02-22 ENCOUNTER — Other Ambulatory Visit (HOSPITAL_COMMUNITY): Payer: Medicaid Other

## 2017-04-05 ENCOUNTER — Ambulatory Visit (HOSPITAL_COMMUNITY)
Admission: RE | Admit: 2017-04-05 | Discharge: 2017-04-05 | Disposition: A | Discharge status: Home / Self Care | Payer: Medicaid Other | Source: Ambulatory Visit | Attending: Interventional Radiology | Admitting: Interventional Radiology

## 2017-04-05 ENCOUNTER — Encounter (HOSPITAL_COMMUNITY): Payer: Self-pay | Admitting: Interventional Radiology

## 2017-04-05 ENCOUNTER — Other Ambulatory Visit (HOSPITAL_COMMUNITY): Payer: Self-pay | Admitting: Interventional Radiology

## 2017-04-05 DIAGNOSIS — N059 Unspecified nephritic syndrome with unspecified morphologic changes: Secondary | ICD-10-CM

## 2017-04-05 DIAGNOSIS — Z436 Encounter for attention to other artificial openings of urinary tract: Secondary | ICD-10-CM | POA: Insufficient documentation

## 2017-04-05 HISTORY — PX: IR NEPHROSTOMY EXCHANGE RIGHT: IMG6070

## 2017-04-05 HISTORY — PX: IR NEPHROSTOMY EXCHANGE LEFT: IMG6069

## 2017-04-05 MED ORDER — LIDOCAINE HCL (PF) 1 % IJ SOLN
INTRAMUSCULAR | Status: DC | PRN
  Administered 2017-04-05: 10 mL

## 2017-04-05 MED ORDER — LIDOCAINE HCL 1 % IJ SOLN
INTRAMUSCULAR | Status: AC
  Filled 2017-04-05: qty 20

## 2017-04-05 MED ORDER — IOPAMIDOL (ISOVUE-300) INJECTION 61%
INTRAVENOUS | Status: AC
  Administered 2017-04-05: 10 mL
  Filled 2017-04-05: qty 50

## 2017-06-03 ENCOUNTER — Ambulatory Visit (HOSPITAL_COMMUNITY)
Admission: RE | Admit: 2017-06-03 | Discharge: 2017-06-03 | Disposition: A | Discharge status: Home / Self Care | Payer: Medicaid Other | Source: Ambulatory Visit | Attending: Interventional Radiology | Admitting: Interventional Radiology

## 2017-06-03 ENCOUNTER — Other Ambulatory Visit (HOSPITAL_COMMUNITY): Payer: Self-pay | Admitting: Interventional Radiology

## 2017-06-03 ENCOUNTER — Encounter (HOSPITAL_COMMUNITY): Payer: Self-pay | Admitting: Interventional Radiology

## 2017-06-03 ENCOUNTER — Ambulatory Visit (HOSPITAL_COMMUNITY): Payer: Medicaid Other

## 2017-06-03 ENCOUNTER — Other Ambulatory Visit (HOSPITAL_COMMUNITY): Payer: Medicaid Other

## 2017-06-03 DIAGNOSIS — Z436 Encounter for attention to other artificial openings of urinary tract: Secondary | ICD-10-CM | POA: Insufficient documentation

## 2017-06-03 DIAGNOSIS — N059 Unspecified nephritic syndrome with unspecified morphologic changes: Secondary | ICD-10-CM

## 2017-06-03 DIAGNOSIS — N135 Crossing vessel and stricture of ureter without hydronephrosis: Secondary | ICD-10-CM | POA: Insufficient documentation

## 2017-06-03 HISTORY — PX: IR NEPHROSTOMY EXCHANGE RIGHT: IMG6070

## 2017-06-03 HISTORY — PX: IR NEPHROSTOMY EXCHANGE LEFT: IMG6069

## 2017-06-03 MED ORDER — LIDOCAINE HCL 1 % IJ SOLN
INTRAMUSCULAR | Status: AC
  Filled 2017-06-03: qty 20

## 2017-06-03 MED ORDER — IOPAMIDOL (ISOVUE-300) INJECTION 61%
INTRAVENOUS | Status: AC
  Administered 2017-06-03: 20 mL
  Filled 2017-06-03: qty 50

## 2017-06-03 MED ORDER — LIDOCAINE HCL (PF) 1 % IJ SOLN
INTRAMUSCULAR | Status: DC | PRN
  Administered 2017-06-03: 10 mL

## 2017-06-03 NOTE — Procedures (Signed)
Interventional Radiology Procedure Note  Procedure: Exchange of  Bilateral neph tubes.  61F flexima placed.   Complications: None Recommendations:  - to drainage - Do not submerge - Routine care   Signed,  Dulcy Fanny. Earleen Newport, DO

## 2017-06-07 ENCOUNTER — Other Ambulatory Visit (HOSPITAL_COMMUNITY): Payer: Medicaid Other

## 2017-08-03 ENCOUNTER — Encounter (HOSPITAL_COMMUNITY): Payer: Self-pay | Admitting: Interventional Radiology

## 2017-08-03 ENCOUNTER — Other Ambulatory Visit (HOSPITAL_COMMUNITY): Payer: Self-pay | Admitting: Interventional Radiology

## 2017-08-03 ENCOUNTER — Ambulatory Visit (HOSPITAL_COMMUNITY)
Admission: RE | Admit: 2017-08-03 | Discharge: 2017-08-03 | Disposition: A | Discharge status: Home / Self Care | Payer: Medicaid Other | Source: Ambulatory Visit | Attending: Interventional Radiology | Admitting: Interventional Radiology

## 2017-08-03 DIAGNOSIS — Z436 Encounter for attention to other artificial openings of urinary tract: Secondary | ICD-10-CM | POA: Diagnosis present

## 2017-08-03 DIAGNOSIS — N059 Unspecified nephritic syndrome with unspecified morphologic changes: Secondary | ICD-10-CM

## 2017-08-03 HISTORY — PX: IR NEPHROSTOMY EXCHANGE RIGHT: IMG6070

## 2017-08-03 HISTORY — PX: IR NEPHROSTOMY EXCHANGE LEFT: IMG6069

## 2017-08-03 MED ORDER — IOPAMIDOL (ISOVUE-300) INJECTION 61%
INTRAVENOUS | Status: AC
  Administered 2017-08-03: 15 mL
  Filled 2017-08-03: qty 50

## 2017-09-30 ENCOUNTER — Encounter (HOSPITAL_COMMUNITY): Payer: Self-pay | Admitting: Interventional Radiology

## 2017-09-30 ENCOUNTER — Ambulatory Visit (HOSPITAL_COMMUNITY)
Admission: RE | Admit: 2017-09-30 | Discharge: 2017-09-30 | Disposition: A | Discharge status: Home / Self Care | Payer: Medicaid Other | Source: Ambulatory Visit | Attending: Interventional Radiology | Admitting: Interventional Radiology

## 2017-09-30 ENCOUNTER — Other Ambulatory Visit (HOSPITAL_COMMUNITY): Payer: Self-pay | Admitting: Interventional Radiology

## 2017-09-30 DIAGNOSIS — Z436 Encounter for attention to other artificial openings of urinary tract: Secondary | ICD-10-CM | POA: Insufficient documentation

## 2017-09-30 DIAGNOSIS — N059 Unspecified nephritic syndrome with unspecified morphologic changes: Secondary | ICD-10-CM

## 2017-09-30 DIAGNOSIS — N1339 Other hydronephrosis: Secondary | ICD-10-CM | POA: Diagnosis not present

## 2017-09-30 HISTORY — PX: IR NEPHROSTOMY EXCHANGE RIGHT: IMG6070

## 2017-09-30 HISTORY — PX: IR NEPHROSTOMY EXCHANGE LEFT: IMG6069

## 2017-09-30 MED ORDER — LIDOCAINE HCL 1 % IJ SOLN
INTRAMUSCULAR | Status: AC | PRN
  Administered 2017-09-30: 10 mL

## 2017-09-30 MED ORDER — IOPAMIDOL (ISOVUE-300) INJECTION 61%
INTRAVENOUS | Status: AC
  Administered 2017-09-30: 20 mL
  Filled 2017-09-30: qty 50

## 2017-09-30 MED ORDER — LIDOCAINE HCL 1 % IJ SOLN
INTRAMUSCULAR | Status: AC
  Filled 2017-09-30: qty 20

## 2017-09-30 NOTE — Procedures (Signed)
Pre Procedure Dx: Hydronephrosis Post Procedure Dx: Same  Successful bilateral PCN exchange.    EBL: None   No immediate complications.   Jay Shuayb Schepers, MD Pager #: [phone]  

## 2017-10-04 ENCOUNTER — Ambulatory Visit (HOSPITAL_COMMUNITY): Payer: Medicaid Other

## 2017-11-24 ENCOUNTER — Other Ambulatory Visit: Payer: Self-pay | Admitting: Student

## 2017-11-25 ENCOUNTER — Other Ambulatory Visit (HOSPITAL_COMMUNITY): Payer: Self-pay | Admitting: Interventional Radiology

## 2017-11-25 ENCOUNTER — Encounter (HOSPITAL_COMMUNITY): Payer: Self-pay | Admitting: Diagnostic Radiology

## 2017-11-25 ENCOUNTER — Ambulatory Visit (HOSPITAL_COMMUNITY)
Admission: RE | Admit: 2017-11-25 | Discharge: 2017-11-25 | Disposition: A | Discharge status: Home / Self Care | Payer: Medicaid Other | Source: Ambulatory Visit | Attending: Interventional Radiology | Admitting: Interventional Radiology

## 2017-11-25 DIAGNOSIS — N059 Unspecified nephritic syndrome with unspecified morphologic changes: Secondary | ICD-10-CM

## 2017-11-25 DIAGNOSIS — Z436 Encounter for attention to other artificial openings of urinary tract: Secondary | ICD-10-CM | POA: Insufficient documentation

## 2017-11-25 HISTORY — PX: IR NEPHROSTOMY EXCHANGE LEFT: IMG6069

## 2017-11-25 HISTORY — PX: IR NEPHROSTOMY EXCHANGE RIGHT: IMG6070

## 2017-11-25 MED ORDER — LIDOCAINE HCL 1 % IJ SOLN
INTRAMUSCULAR | Status: AC
  Filled 2017-11-25: qty 20

## 2017-11-25 MED ORDER — IOPAMIDOL (ISOVUE-300) INJECTION 61%
INTRAVENOUS | Status: AC
  Administered 2017-11-25: 20 mL
  Filled 2017-11-25: qty 50

## 2017-11-25 MED ORDER — LIDOCAINE HCL 1 % IJ SOLN
INTRAMUSCULAR | Status: AC | PRN
  Administered 2017-11-25: 5 mL

## 2017-11-25 NOTE — Procedures (Signed)
Routine exchange of bilateral nephrostomy tubes.  No blood loss and no immediate complication.

## 2018-01-15 IMAGING — XA IR EXCHANGE NEPHROSTOMY RIGHT
1 series · 9 of 9 positions shown · non-contrast
Comparison: None.

ADDENDUM:
Case was discussed with referring urologist, Dr. Erin, and the
decision was made to pursue routine bilateral percutaneous
nephrostomy catheter exchanges until further notice.

He will see the patient in follow-up consultation in several months
at which time he will determine whether attempted internalization is
appropriate.
INDICATION: History of cervical cancer, post bilateral percutaneous nephrostomy
catheter placement on 07/23/2015. Patient returns to the
percutaneous nephrostomy catheter exchange.
EXAM:
FLUOROSCOPIC GUIDED BILATERAL SIDED NEPHROSTOMY CATHETER EXCHANGE
TECHNIQUE: Informed written consent was obtained from the patient after a
discussion of the risks, benefits and alternatives to treatment.
Questions regarding the procedure were encouraged and answered. A
timeout was performed prior to the initiation of the procedure.

[Series 300: tube placements · 9 of 9 slices shown]
[im 1/9]
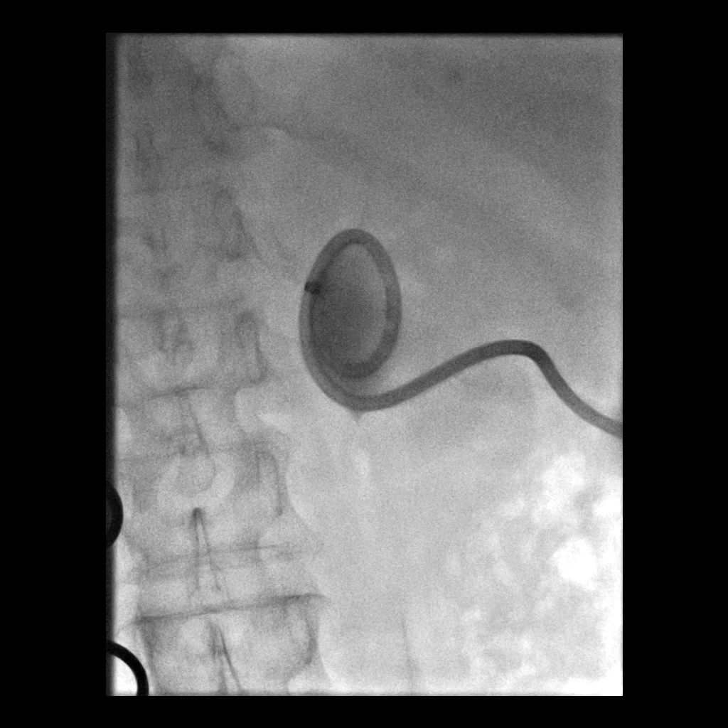
[im 2/9]
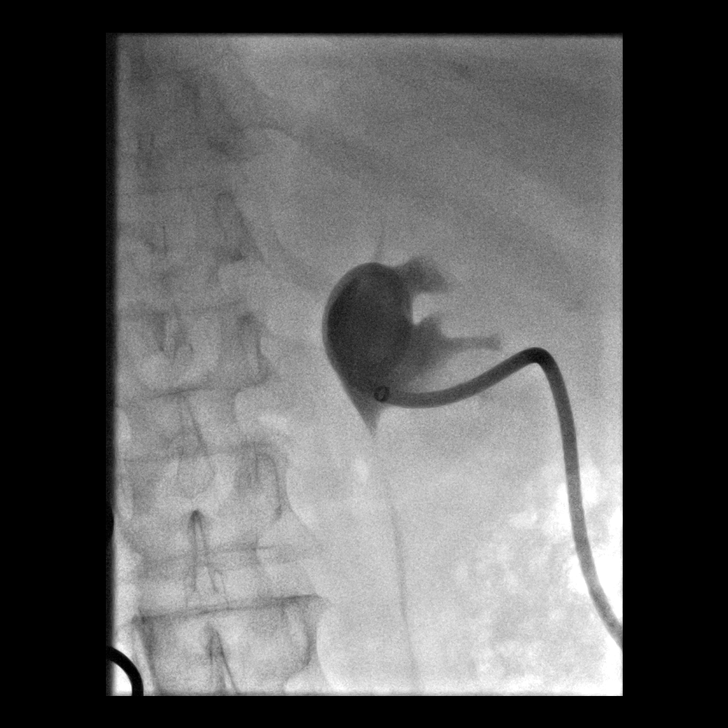
[im 3/9]
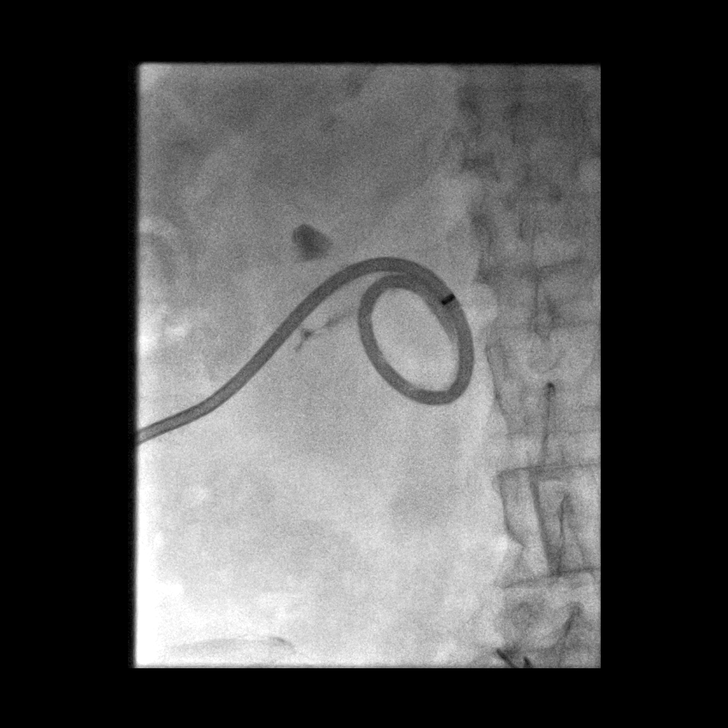
[im 4/9]
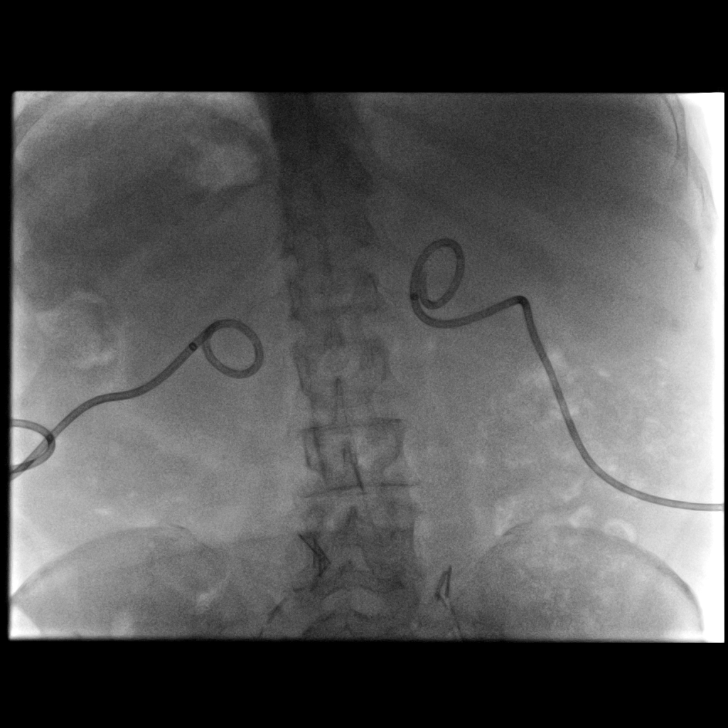
[im 5/9]
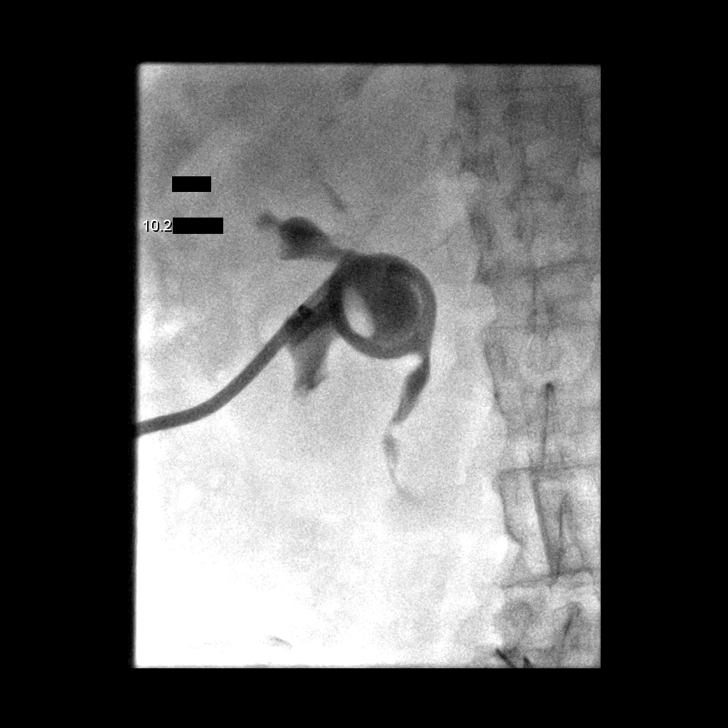
[im 6/9]
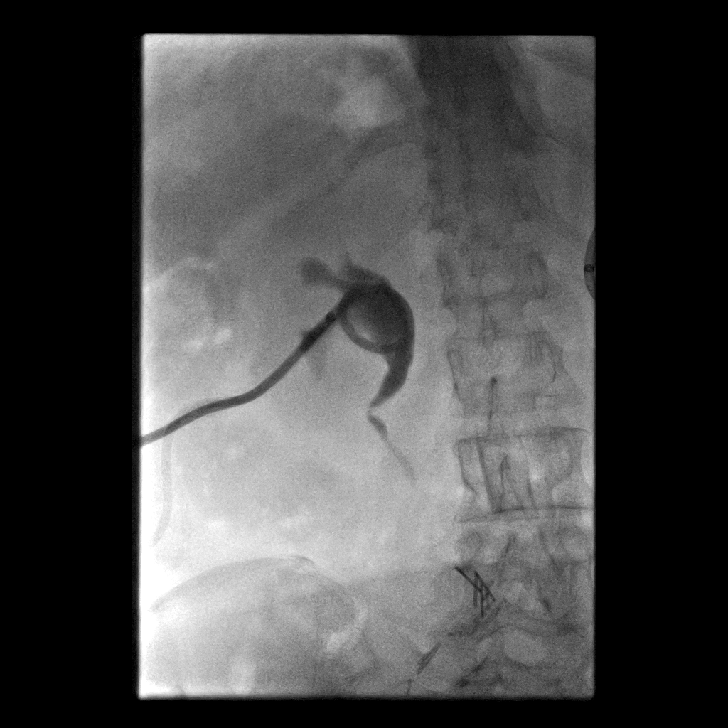
[im 7/9]
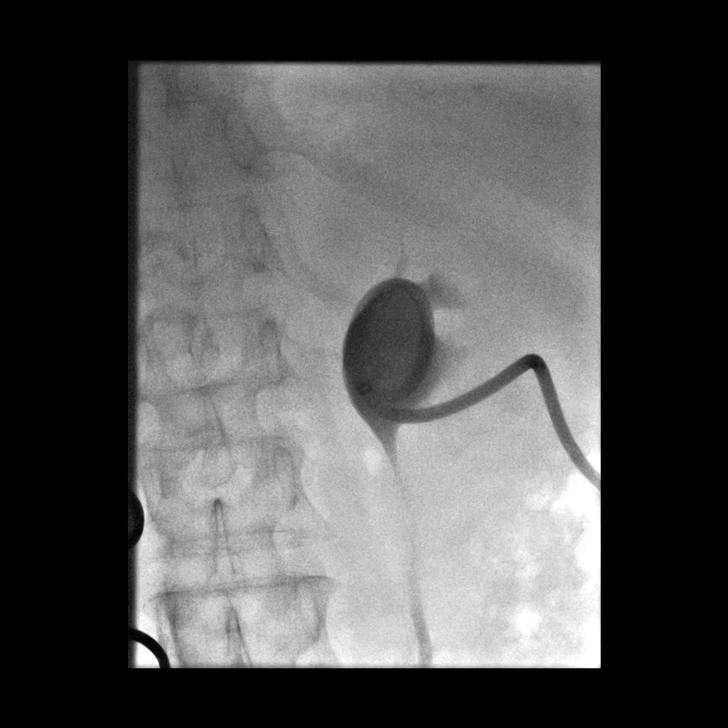
[im 8/9]
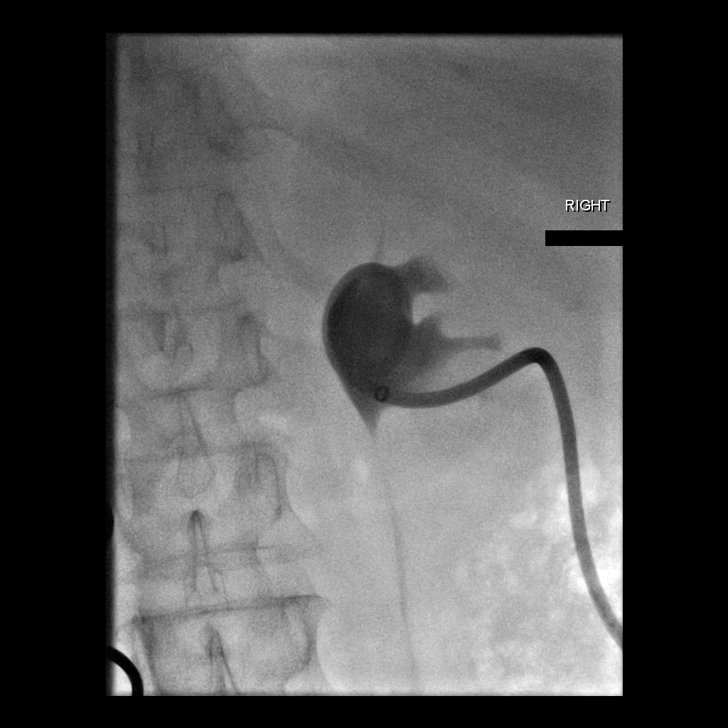
[im 9/9]
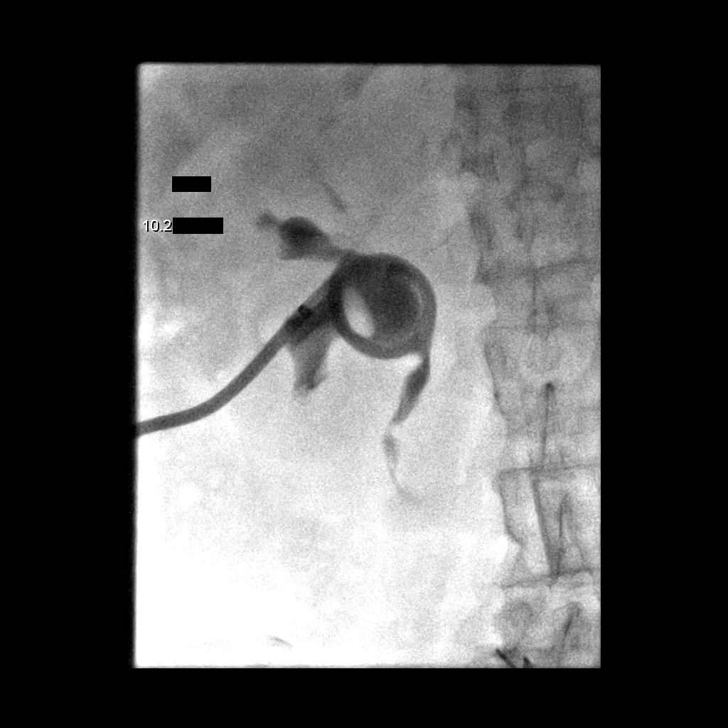

[9 of 9 positions shown; findings below may reference images not displayed]

CONTRAST:  A total of 15 mL Usovue-[id number] administered was administered
into both collecting systems

FLUOROSCOPY TIME:  48 seconds (5 mGy)

COMPLICATIONS:
None immediate.
The bilateral flanks and external portions of existing nephrostomy
catheters were prepped and draped in the usual sterile fashion. A
sterile drape was applied covering the operative field. Maximum
barrier sterile technique with sterile gowns and gloves were used
for the procedure. A timeout was performed prior to the initiation
of the procedure.

A pre procedural spot fluoroscopic image was obtained. Beginning
with the left-sided nephrostomy, a small amount of contrast was
injected via the existing left-sided nephrostomy catheter
demonstrating appropriate positioning within the renal pelvis. The
existing nephrostomy catheter was cut and cannulated with a Benson
wire which was coiled within the renal pelvis. Under intermittent
fluoroscopic guidance, the existing nephrostomy catheter was
exchanged for a new 10.2 French all-purpose drainage catheter.
Limited contrast injection confirmed appropriate positioning within
the left renal pelvis and a post exchange fluoroscopic image was
obtained. The catheter was locked, secured to the skin with an
interrupted suture and reconnected to a gravity bag.

The identical repeat procedure was repeated for the contralateral
right-sided nephrostomy, ultimately allowing successful exchange of
a new 10.2 French all-purpose drainage catheter with end coiled and
locked within the right renal pelvis.

Dressings were placed. The patient tolerated the above procedures
well without immediate postprocedural complication.
FINDINGS: The existing nephrostomy catheters are appropriately positioned and
functioning.

After successful fluoroscopic guided exchange, new bilateral
French nephrostomy catheters are coiled and locked within the
respective renal pelvises.
IMPRESSION: Successful fluoroscopic guided exchange of bilateral 10.2 French
percutaneous nephrostomy catheters.

## 2018-01-24 ENCOUNTER — Inpatient Hospital Stay (HOSPITAL_COMMUNITY): Admission: RE | Admit: 2018-01-24 | Payer: Medicaid Other | Source: Ambulatory Visit

## 2018-01-25 ENCOUNTER — Other Ambulatory Visit (HOSPITAL_COMMUNITY): Payer: Self-pay | Admitting: Interventional Radiology

## 2018-01-25 ENCOUNTER — Ambulatory Visit (HOSPITAL_COMMUNITY)
Admission: RE | Admit: 2018-01-25 | Discharge: 2018-01-25 | Disposition: A | Discharge status: Home / Self Care | Payer: Medicaid Other | Source: Ambulatory Visit | Attending: Interventional Radiology | Admitting: Interventional Radiology

## 2018-01-25 ENCOUNTER — Encounter (HOSPITAL_COMMUNITY): Payer: Self-pay | Admitting: Interventional Radiology

## 2018-01-25 DIAGNOSIS — N059 Unspecified nephritic syndrome with unspecified morphologic changes: Secondary | ICD-10-CM

## 2018-01-25 DIAGNOSIS — N135 Crossing vessel and stricture of ureter without hydronephrosis: Secondary | ICD-10-CM | POA: Insufficient documentation

## 2018-01-25 DIAGNOSIS — Z436 Encounter for attention to other artificial openings of urinary tract: Secondary | ICD-10-CM | POA: Diagnosis not present

## 2018-01-25 HISTORY — PX: IR NEPHROSTOMY EXCHANGE RIGHT: IMG6070

## 2018-01-25 HISTORY — PX: IR NEPHROSTOMY EXCHANGE LEFT: IMG6069

## 2018-01-25 MED ORDER — LIDOCAINE HCL 1 % IJ SOLN
INTRAMUSCULAR | Status: DC | PRN
  Administered 2018-01-25: 10 mL

## 2018-01-25 MED ORDER — SODIUM CHLORIDE 0.9% FLUSH: 5.0000 mL | Freq: Three times a day (TID) | INTRAVENOUS | Status: DC

## 2018-01-25 MED ORDER — LIDOCAINE HCL 1 % IJ SOLN
INTRAMUSCULAR | Status: AC
  Filled 2018-01-25: qty 20

## 2018-01-25 MED ORDER — IOPAMIDOL (ISOVUE-300) INJECTION 61%
INTRAVENOUS | Status: AC
  Administered 2018-01-25: 15 mL
  Filled 2018-01-25: qty 50

## 2018-01-25 NOTE — Procedures (Signed)
Interventional Radiology Procedure Note  Procedure: routine exchange of bilateral PCN, new 68F drains..  Complications: None Recommendations:  - to gravity - Do not submerge - Routine drain care   Signed,  Dulcy Fanny. Earleen Newport, DO

## 2018-03-07 ENCOUNTER — Other Ambulatory Visit (HOSPITAL_COMMUNITY): Payer: Self-pay | Admitting: Interventional Radiology

## 2018-03-07 ENCOUNTER — Encounter (HOSPITAL_COMMUNITY): Payer: Self-pay | Admitting: Interventional Radiology

## 2018-03-07 ENCOUNTER — Ambulatory Visit (HOSPITAL_COMMUNITY)
Admission: RE | Admit: 2018-03-07 | Discharge: 2018-03-07 | Disposition: A | Discharge status: Home / Self Care | Payer: Medicaid Other | Source: Ambulatory Visit | Attending: Interventional Radiology | Admitting: Interventional Radiology

## 2018-03-07 DIAGNOSIS — Z436 Encounter for attention to other artificial openings of urinary tract: Secondary | ICD-10-CM | POA: Diagnosis present

## 2018-03-07 DIAGNOSIS — N059 Unspecified nephritic syndrome with unspecified morphologic changes: Secondary | ICD-10-CM

## 2018-03-07 HISTORY — PX: IR NEPHROSTOMY EXCHANGE RIGHT: IMG6070

## 2018-03-07 HISTORY — PX: IR NEPHROSTOMY EXCHANGE LEFT: IMG6069

## 2018-03-07 MED ORDER — LIDOCAINE HCL (PF) 1 % IJ SOLN
INTRAMUSCULAR | Status: DC | PRN
  Administered 2018-03-07: 10 mL

## 2018-03-07 MED ORDER — IOPAMIDOL (ISOVUE-300) INJECTION 61%
INTRAVENOUS | Status: AC
  Filled 2018-03-07: qty 50

## 2018-03-07 MED ORDER — LIDOCAINE HCL 1 % IJ SOLN
INTRAMUSCULAR | Status: AC
  Filled 2018-03-07: qty 20

## 2018-03-07 NOTE — Procedures (Signed)
B 12 Fr PCN exchange EBL 0 Comp 0

## 2018-03-08 ENCOUNTER — Ambulatory Visit (HOSPITAL_COMMUNITY): Payer: Medicaid Other

## 2018-03-22 ENCOUNTER — Inpatient Hospital Stay (HOSPITAL_COMMUNITY): Admission: RE | Admit: 2018-03-22 | Payer: Medicaid Other | Source: Ambulatory Visit

## 2018-05-02 ENCOUNTER — Other Ambulatory Visit (HOSPITAL_COMMUNITY): Payer: Self-pay | Admitting: Interventional Radiology

## 2018-05-02 ENCOUNTER — Encounter (HOSPITAL_COMMUNITY): Payer: Self-pay | Admitting: Interventional Radiology

## 2018-05-02 ENCOUNTER — Ambulatory Visit (HOSPITAL_COMMUNITY)
Admission: RE | Admit: 2018-05-02 | Discharge: 2018-05-02 | Disposition: A | Discharge status: Home / Self Care | Payer: Medicaid Other | Source: Ambulatory Visit | Attending: Interventional Radiology | Admitting: Interventional Radiology

## 2018-05-02 DIAGNOSIS — N059 Unspecified nephritic syndrome with unspecified morphologic changes: Secondary | ICD-10-CM | POA: Insufficient documentation

## 2018-05-02 DIAGNOSIS — Z436 Encounter for attention to other artificial openings of urinary tract: Secondary | ICD-10-CM | POA: Insufficient documentation

## 2018-05-02 HISTORY — PX: IR NEPHROSTOMY EXCHANGE LEFT: IMG6069

## 2018-05-02 HISTORY — PX: IR NEPHROSTOMY EXCHANGE RIGHT: IMG6070

## 2018-05-02 MED ORDER — IOPAMIDOL (ISOVUE-300) INJECTION 61%
INTRAVENOUS | Status: AC
  Administered 2018-05-02: 15 mL
  Filled 2018-05-02: qty 50

## 2018-05-02 MED ORDER — LIDOCAINE HCL 1 % IJ SOLN
INTRAMUSCULAR | Status: DC | PRN
  Administered 2018-05-02: 10 mL

## 2018-05-02 MED ORDER — LIDOCAINE HCL 1 % IJ SOLN
INTRAMUSCULAR | Status: AC
  Filled 2018-05-02: qty 20

## 2018-06-27 ENCOUNTER — Inpatient Hospital Stay (HOSPITAL_COMMUNITY): Admission: RE | Admit: 2018-06-27 | Payer: Medicaid Other | Source: Ambulatory Visit

## 2018-08-09 ENCOUNTER — Other Ambulatory Visit: Payer: Self-pay | Admitting: Student

## 2018-08-10 ENCOUNTER — Other Ambulatory Visit: Payer: Self-pay

## 2018-08-10 ENCOUNTER — Other Ambulatory Visit (HOSPITAL_COMMUNITY): Payer: Self-pay | Admitting: Interventional Radiology

## 2018-08-10 ENCOUNTER — Ambulatory Visit (HOSPITAL_COMMUNITY)
Admission: RE | Admit: 2018-08-10 | Discharge: 2018-08-10 | Disposition: A | Discharge status: Home / Self Care | Payer: Medicaid Other | Source: Ambulatory Visit | Attending: Interventional Radiology | Admitting: Interventional Radiology

## 2018-08-10 ENCOUNTER — Encounter (HOSPITAL_COMMUNITY): Payer: Self-pay | Admitting: Diagnostic Radiology

## 2018-08-10 ENCOUNTER — Other Ambulatory Visit (HOSPITAL_COMMUNITY): Payer: Self-pay | Admitting: Diagnostic Radiology

## 2018-08-10 DIAGNOSIS — N059 Unspecified nephritic syndrome with unspecified morphologic changes: Secondary | ICD-10-CM

## 2018-08-10 DIAGNOSIS — Z436 Encounter for attention to other artificial openings of urinary tract: Secondary | ICD-10-CM | POA: Diagnosis not present

## 2018-08-10 DIAGNOSIS — N1330 Unspecified hydronephrosis: Secondary | ICD-10-CM

## 2018-08-10 HISTORY — PX: IR NEPHROSTOMY EXCHANGE RIGHT: IMG6070

## 2018-08-10 HISTORY — PX: IR NEPHROSTOMY EXCHANGE LEFT: IMG6069

## 2018-08-10 MED ORDER — IOHEXOL 300 MG/ML  SOLN
50.0000 mL | Freq: Once | INTRAMUSCULAR | Status: AC | PRN
  Administered 2018-08-10: 10:00:00 20 mL via INTRAVENOUS

## 2018-08-10 MED ORDER — LIDOCAINE HCL 1 % IJ SOLN
INTRAMUSCULAR | Status: AC
  Filled 2018-08-10: qty 20

## 2018-08-10 MED ORDER — LIDOCAINE HCL (PF) 1 % IJ SOLN
INTRAMUSCULAR | Status: DC | PRN
  Administered 2018-08-10: 5 mL

## 2018-08-10 NOTE — Procedures (Signed)
Interventional Radiology Procedure:   Indications: Routine nephrostomy tube exchange  Procedure: Nephrostomy tube exchange  Findings: Bilateral tubes in renal pelvis, 12 Fr drains  Complications: None     EBL: None  Plan: Routine exchange in 12 weeks.     Heidi [name] R. Anselm Pancoast, MD  Pager: [phone]

## 2018-11-10 ENCOUNTER — Ambulatory Visit (HOSPITAL_COMMUNITY)
Admission: RE | Admit: 2018-11-10 | Discharge: 2018-11-10 | Disposition: A | Discharge status: Home / Self Care | Payer: Medicaid Other | Source: Ambulatory Visit | Attending: Diagnostic Radiology | Admitting: Diagnostic Radiology

## 2018-11-10 ENCOUNTER — Other Ambulatory Visit (HOSPITAL_COMMUNITY): Payer: Self-pay | Admitting: Diagnostic Radiology

## 2018-11-10 ENCOUNTER — Encounter (HOSPITAL_COMMUNITY): Payer: Self-pay | Admitting: Diagnostic Radiology

## 2018-11-10 ENCOUNTER — Other Ambulatory Visit: Payer: Self-pay

## 2018-11-10 DIAGNOSIS — N1330 Unspecified hydronephrosis: Secondary | ICD-10-CM

## 2018-11-10 DIAGNOSIS — Z436 Encounter for attention to other artificial openings of urinary tract: Secondary | ICD-10-CM | POA: Diagnosis not present

## 2018-11-10 HISTORY — PX: IR NEPHROSTOMY EXCHANGE LEFT: IMG6069

## 2018-11-10 HISTORY — PX: IR NEPHROSTOMY EXCHANGE RIGHT: IMG6070

## 2018-11-10 MED ORDER — LIDOCAINE HCL 1 % IJ SOLN
INTRAMUSCULAR | Status: AC | PRN
  Administered 2018-11-10: 5 mL

## 2018-11-10 MED ORDER — IOHEXOL 300 MG/ML  SOLN
50.0000 mL | Freq: Once | INTRAMUSCULAR | Status: AC | PRN
  Administered 2018-11-10: 15 mL

## 2018-11-10 MED ORDER — LIDOCAINE HCL 1 % IJ SOLN
INTRAMUSCULAR | Status: AC
  Filled 2018-11-10: qty 20

## 2018-11-10 NOTE — Procedures (Signed)
Interventional Radiology Procedure:   Indications: Chronic nephrostomy tubes and routine exchange.  Procedure: Bilateral nephrostomy tube exchange  Findings: 12 Fr drains in renal pelvis  Complications: None     EBL: None   Plan: Continue with routine exchanges.   Heidi [name] R. Anselm Pancoast, MD  Pager: [phone]

## 2019-01-26 ENCOUNTER — Telehealth (HOSPITAL_COMMUNITY): Payer: Self-pay

## 2019-01-26 NOTE — Telephone Encounter (Signed)
Called to move pt down to noon, no answer, left vm. AW

## 2019-02-02 ENCOUNTER — Other Ambulatory Visit (HOSPITAL_COMMUNITY): Payer: Medicaid Other

## 2019-02-02 ENCOUNTER — Other Ambulatory Visit (HOSPITAL_COMMUNITY): Payer: Self-pay | Admitting: Interventional Radiology

## 2019-02-02 ENCOUNTER — Ambulatory Visit (HOSPITAL_COMMUNITY)
Admission: RE | Admit: 2019-02-02 | Discharge: 2019-02-02 | Disposition: A | Discharge status: Home / Self Care | Payer: Medicaid Other | Source: Ambulatory Visit | Attending: Diagnostic Radiology | Admitting: Diagnostic Radiology

## 2019-02-02 ENCOUNTER — Other Ambulatory Visit: Payer: Self-pay

## 2019-02-02 ENCOUNTER — Encounter (HOSPITAL_COMMUNITY): Payer: Self-pay | Admitting: Interventional Radiology

## 2019-02-02 DIAGNOSIS — N1330 Unspecified hydronephrosis: Secondary | ICD-10-CM

## 2019-02-02 DIAGNOSIS — N135 Crossing vessel and stricture of ureter without hydronephrosis: Secondary | ICD-10-CM | POA: Insufficient documentation

## 2019-02-02 DIAGNOSIS — Z436 Encounter for attention to other artificial openings of urinary tract: Secondary | ICD-10-CM | POA: Diagnosis present

## 2019-02-02 HISTORY — PX: IR NEPHROSTOMY EXCHANGE RIGHT: IMG6070

## 2019-02-02 HISTORY — PX: IR NEPHROSTOMY EXCHANGE LEFT: IMG6069

## 2019-02-02 MED ORDER — LIDOCAINE HCL 1 % IJ SOLN
INTRAMUSCULAR | Status: AC
  Filled 2019-02-02: qty 20

## 2019-02-02 MED ORDER — LIDOCAINE HCL 1 % IJ SOLN
INTRAMUSCULAR | Status: DC | PRN
  Administered 2019-02-02: 10 mL

## 2019-02-02 MED ORDER — IOHEXOL 300 MG/ML  SOLN
50.0000 mL | Freq: Once | INTRAMUSCULAR | Status: AC | PRN
  Administered 2019-02-02: 13:00:00 20 mL

## 2019-02-02 NOTE — Procedures (Signed)
  Procedure: Bilat perc neph change 52f, look ok @ 12wk interval EBL:   minimal Complications:  none immediate  See full dictation in [facility].  Dillard Cannon MD Main # [phone] Pager  [phone]

## 2019-04-27 ENCOUNTER — Inpatient Hospital Stay (HOSPITAL_COMMUNITY): Admission: RE | Admit: 2019-04-27 | Payer: Medicaid Other | Source: Ambulatory Visit

## 2019-05-23 ENCOUNTER — Telehealth (HOSPITAL_COMMUNITY): Payer: Self-pay

## 2019-05-23 NOTE — Telephone Encounter (Signed)
Called to schedule bilateral tube exchange, no answer, left vm. AW

## 2019-06-06 ENCOUNTER — Other Ambulatory Visit: Payer: Self-pay

## 2019-06-06 ENCOUNTER — Ambulatory Visit (HOSPITAL_COMMUNITY): Payer: Medicaid Other

## 2019-06-06 ENCOUNTER — Other Ambulatory Visit (HOSPITAL_COMMUNITY): Payer: Self-pay | Admitting: Interventional Radiology

## 2019-06-06 ENCOUNTER — Ambulatory Visit (HOSPITAL_COMMUNITY)
Admission: RE | Admit: 2019-06-06 | Discharge: 2019-06-06 | Disposition: A | Discharge status: Home / Self Care | Payer: Medicaid Other | Source: Ambulatory Visit | Attending: Interventional Radiology | Admitting: Interventional Radiology

## 2019-06-06 ENCOUNTER — Encounter (HOSPITAL_COMMUNITY): Payer: Self-pay

## 2019-06-06 DIAGNOSIS — Z436 Encounter for attention to other artificial openings of urinary tract: Secondary | ICD-10-CM | POA: Insufficient documentation

## 2019-06-06 DIAGNOSIS — N1330 Unspecified hydronephrosis: Secondary | ICD-10-CM | POA: Insufficient documentation

## 2019-06-06 HISTORY — PX: IR NEPHROSTOMY EXCHANGE LEFT: IMG6069

## 2019-06-06 HISTORY — PX: IR NEPHROSTOMY EXCHANGE RIGHT: IMG6070

## 2019-06-06 MED ORDER — IOHEXOL 300 MG/ML  SOLN
50.0000 mL | Freq: Once | INTRAMUSCULAR | Status: AC | PRN
  Administered 2019-06-06: 10:00:00 20 mL

## 2019-06-06 MED ORDER — LIDOCAINE HCL 1 % IJ SOLN
INTRAMUSCULAR | Status: AC
  Filled 2019-06-06: qty 20

## 2019-06-06 MED ORDER — LIDOCAINE HCL (PF) 1 % IJ SOLN
INTRAMUSCULAR | Status: AC | PRN
  Administered 2019-06-06: 10 mL

## 2019-06-06 NOTE — Procedures (Signed)
Pre Procedure Dx: Hydronephrosis Post Procedure Dx: Same  Successful bilateral PCN exchange.    EBL: None   No immediate complications.   Jay Tyresse Jayson, MD Pager #: [phone]  

## 2019-09-04 ENCOUNTER — Ambulatory Visit (HOSPITAL_COMMUNITY): Payer: Medicaid Other

## 2019-09-05 ENCOUNTER — Encounter (HOSPITAL_COMMUNITY): Payer: Self-pay

## 2019-09-05 ENCOUNTER — Ambulatory Visit (HOSPITAL_COMMUNITY): Admission: RE | Admit: 2019-09-05 | Payer: Medicaid Other | Source: Ambulatory Visit

## 2019-09-05 ENCOUNTER — Ambulatory Visit (HOSPITAL_COMMUNITY): Payer: Medicaid Other

## 2019-11-09 ENCOUNTER — Ambulatory Visit (HOSPITAL_COMMUNITY): Admission: RE | Admit: 2019-11-09 | Payer: Medicaid Other | Source: Ambulatory Visit

## 2019-11-09 ENCOUNTER — Other Ambulatory Visit (HOSPITAL_COMMUNITY): Payer: Self-pay | Admitting: Interventional Radiology

## 2019-11-09 ENCOUNTER — Other Ambulatory Visit: Payer: Self-pay

## 2019-11-09 ENCOUNTER — Ambulatory Visit (HOSPITAL_COMMUNITY)
Admission: RE | Admit: 2019-11-09 | Discharge: 2019-11-09 | Disposition: A | Discharge status: Home / Self Care | Payer: Medicaid Other | Source: Ambulatory Visit | Attending: Interventional Radiology | Admitting: Interventional Radiology

## 2019-11-09 DIAGNOSIS — Z436 Encounter for attention to other artificial openings of urinary tract: Secondary | ICD-10-CM | POA: Diagnosis present

## 2019-11-09 DIAGNOSIS — N1330 Unspecified hydronephrosis: Secondary | ICD-10-CM | POA: Diagnosis not present

## 2019-11-09 HISTORY — PX: IR NEPHROSTOMY EXCHANGE LEFT: IMG6069

## 2019-11-09 HISTORY — PX: IR NEPHROSTOMY EXCHANGE RIGHT: IMG6070

## 2019-11-09 MED ORDER — LIDOCAINE HCL 1 % IJ SOLN
INTRAMUSCULAR | Status: AC
  Filled 2019-11-09: qty 20

## 2019-11-09 MED ORDER — LIDOCAINE HCL (PF) 1 % IJ SOLN
INTRAMUSCULAR | Status: DC | PRN
  Administered 2019-11-09: 10 mL

## 2019-11-09 MED ORDER — IOHEXOL 300 MG/ML  SOLN
50.0000 mL | Freq: Once | INTRAMUSCULAR | Status: AC | PRN
  Administered 2019-11-09: 20 mL

## 2019-11-29 ENCOUNTER — Encounter (HOSPITAL_COMMUNITY): Payer: Self-pay

## 2019-11-29 NOTE — Addendum Note (Signed)
Encounter addended by: Riley Churches on: 11/29/2019 10:06 AM  Actions taken: Imaging Exam ended

## 2020-02-01 ENCOUNTER — Ambulatory Visit (HOSPITAL_COMMUNITY)
Admission: RE | Admit: 2020-02-01 | Discharge: 2020-02-01 | Disposition: A | Discharge status: Home / Self Care | Payer: Medicaid Other | Source: Ambulatory Visit | Attending: Interventional Radiology | Admitting: Interventional Radiology

## 2020-02-01 ENCOUNTER — Other Ambulatory Visit: Payer: Self-pay

## 2020-02-01 ENCOUNTER — Other Ambulatory Visit (HOSPITAL_COMMUNITY): Payer: Self-pay | Admitting: Interventional Radiology

## 2020-02-01 DIAGNOSIS — Z436 Encounter for attention to other artificial openings of urinary tract: Secondary | ICD-10-CM | POA: Diagnosis not present

## 2020-02-01 DIAGNOSIS — N1330 Unspecified hydronephrosis: Secondary | ICD-10-CM

## 2020-02-01 HISTORY — PX: IR NEPHROSTOMY EXCHANGE LEFT: IMG6069

## 2020-02-01 MED ORDER — IOHEXOL 300 MG/ML  SOLN
50.0000 mL | Freq: Once | INTRAMUSCULAR | Status: AC | PRN
  Administered 2020-02-01: 20 mL

## 2020-02-01 MED ORDER — LIDOCAINE HCL (PF) 1 % IJ SOLN
INTRAMUSCULAR | Status: DC | PRN
  Administered 2020-02-01: 10 mL

## 2020-02-01 MED ORDER — LIDOCAINE-EPINEPHRINE 1 %-1:100000 IJ SOLN
INTRAMUSCULAR | Status: AC
  Filled 2020-02-01: qty 1

## 2020-02-01 MED ORDER — LIDOCAINE HCL (PF) 1 % IJ SOLN
INTRAMUSCULAR | Status: AC | PRN
  Administered 2020-02-01: 10 mL

## 2020-02-01 NOTE — Procedures (Signed)
Interventional Radiology Procedure Note  Procedure: Bilateral nephrostomy tube check and change  Findings: Please refer to procedural dictation for full description. Successful exchange of indwelling bilateral nephrostomies, 12 Fr.  Contrast was noted to quickly evacuate the collecting systems, so complete nephroureterogram was performed which demonstrated widely patent ureters bilaterally.  Complications: None immediate  Estimated Blood Loss: <5 mL  Recommendations: Plan to initiate capping trial tomorrow. The patient's granddaughter was given specific instructions and stated understanding to place the catheters back to bag drainage if the patient develops fevers, chills, back pain, hematuria.   Plan to follow up in 1 week via telephone to assess capping trial. Will reach out to Dr. Tresa Moore to discuss long term plan and candidacy for nephrostomy removal.   Ruthann Cancer, MD Pager: [phone]

## 2020-04-11 ENCOUNTER — Other Ambulatory Visit: Payer: Self-pay

## 2020-04-11 ENCOUNTER — Ambulatory Visit (HOSPITAL_COMMUNITY)
Admission: RE | Admit: 2020-04-11 | Discharge: 2020-04-11 | Disposition: A | Discharge status: Home / Self Care | Payer: Medicaid Other | Source: Ambulatory Visit | Attending: Interventional Radiology | Admitting: Interventional Radiology

## 2020-04-11 DIAGNOSIS — N1330 Unspecified hydronephrosis: Secondary | ICD-10-CM

## 2020-04-16 ENCOUNTER — Other Ambulatory Visit: Payer: Self-pay

## 2020-04-16 ENCOUNTER — Other Ambulatory Visit (HOSPITAL_COMMUNITY): Payer: Self-pay | Admitting: Interventional Radiology

## 2020-04-16 ENCOUNTER — Ambulatory Visit (HOSPITAL_COMMUNITY)
Admission: RE | Admit: 2020-04-16 | Discharge: 2020-04-16 | Disposition: A | Discharge status: Home / Self Care | Payer: Medicaid Other | Source: Ambulatory Visit | Attending: Interventional Radiology | Admitting: Interventional Radiology

## 2020-04-16 DIAGNOSIS — N1330 Unspecified hydronephrosis: Secondary | ICD-10-CM | POA: Diagnosis not present

## 2020-04-16 DIAGNOSIS — Z436 Encounter for attention to other artificial openings of urinary tract: Secondary | ICD-10-CM | POA: Diagnosis present

## 2020-04-16 HISTORY — PX: IR NEPHROSTOMY EXCHANGE LEFT: IMG6069

## 2020-04-16 HISTORY — PX: IR NEPHROSTOMY EXCHANGE RIGHT: IMG6070

## 2020-04-16 MED ORDER — IOHEXOL 300 MG/ML  SOLN
50.0000 mL | Freq: Once | INTRAMUSCULAR | Status: AC | PRN
  Administered 2020-04-16: 15 mL

## 2020-04-16 MED ORDER — LIDOCAINE HCL 1 % IJ SOLN
INTRAMUSCULAR | Status: AC
  Filled 2020-04-16: qty 20

## 2020-04-16 NOTE — Procedures (Signed)
Interventional Radiology Procedure Note  Procedure: bilateral 19 fr pcn exchgs    Complications: None  Estimated Blood Loss:  0  Findings: Full report in pacs     Tamera Punt, MD

## 2020-05-28 ENCOUNTER — Ambulatory Visit (HOSPITAL_COMMUNITY)
Admission: RE | Admit: 2020-05-28 | Discharge: 2020-05-28 | Disposition: A | Discharge status: Home / Self Care | Payer: Medicaid Other | Source: Ambulatory Visit | Attending: Interventional Radiology | Admitting: Interventional Radiology

## 2020-05-28 ENCOUNTER — Ambulatory Visit (HOSPITAL_COMMUNITY): Payer: Medicaid Other

## 2020-05-28 ENCOUNTER — Other Ambulatory Visit (HOSPITAL_COMMUNITY): Payer: Self-pay | Admitting: Interventional Radiology

## 2020-05-28 ENCOUNTER — Other Ambulatory Visit: Payer: Self-pay

## 2020-05-28 DIAGNOSIS — Z436 Encounter for attention to other artificial openings of urinary tract: Secondary | ICD-10-CM | POA: Insufficient documentation

## 2020-05-28 DIAGNOSIS — N1330 Unspecified hydronephrosis: Secondary | ICD-10-CM

## 2020-05-28 HISTORY — PX: IR NEPHROSTOMY EXCHANGE RIGHT: IMG6070

## 2020-05-28 HISTORY — PX: IR NEPHROSTOMY EXCHANGE LEFT: IMG6069

## 2020-05-28 MED ORDER — LIDOCAINE HCL 1 % IJ SOLN
INTRAMUSCULAR | Status: AC
  Filled 2020-05-28: qty 20

## 2020-05-28 MED ORDER — IOHEXOL 300 MG/ML  SOLN
50.0000 mL | Freq: Once | INTRAMUSCULAR | Status: AC | PRN
  Administered 2020-05-28: 15 mL

## 2020-05-28 NOTE — Procedures (Signed)
Pre Procedure Dx: Hydronephrosis °Post Procedure Dx: Same ° °Successful bilateral 12 Fr PCN exchange.   ° °EBL: None °No immediate complications.  ° °Jay Andreina Outten, MD °Pager #: [phone] ° ° °

## 2020-05-31 ENCOUNTER — Telehealth: Payer: Self-pay | Admitting: Student

## 2020-05-31 NOTE — Telephone Encounter (Signed)
Interventional Radiology Brief Note:  Called by Madie Reno due to concern for patient needing antibiotics after bilateral nephrostomy tube exchange 3/8.  At the time of exchange patient was noted to have foul-smelling urine.  She did follow-up with Dr. Tresa Moore yesterday, however due to no culture or strong evidence of infection no antibiotic was prescribed.  Per granddaughter, patient has no fever, chills, back pain, difficulty with tubes.  She wanted to verify that in fact no antibiotics were needed at this time.  Discussed patient symptoms with Dr. Pascal Lux who agrees that exchange has apparently allowed for significant decompression and in the absence of signs of further infection it is ok to hold on antibiotics at this time.  Guadalupe verbalizes understanding. She understands to call Dr. Zettie Pho office if further signs of infection develop and she can call our office anytime with concerns related to the nephrostomy tubes.   Brynda Greathouse, MS RD PA-C 3:01 PM

## 2020-07-09 ENCOUNTER — Ambulatory Visit (HOSPITAL_COMMUNITY)
Admission: RE | Admit: 2020-07-09 | Discharge: 2020-07-09 | Disposition: A | Discharge status: Home / Self Care | Payer: Medicaid Other | Source: Ambulatory Visit | Attending: Interventional Radiology | Admitting: Interventional Radiology

## 2020-07-09 ENCOUNTER — Other Ambulatory Visit (HOSPITAL_COMMUNITY): Payer: Medicaid Other

## 2020-07-09 ENCOUNTER — Other Ambulatory Visit (HOSPITAL_COMMUNITY): Payer: Self-pay | Admitting: Interventional Radiology

## 2020-07-09 ENCOUNTER — Other Ambulatory Visit: Payer: Self-pay

## 2020-07-09 DIAGNOSIS — Z436 Encounter for attention to other artificial openings of urinary tract: Secondary | ICD-10-CM | POA: Insufficient documentation

## 2020-07-09 DIAGNOSIS — N1330 Unspecified hydronephrosis: Secondary | ICD-10-CM | POA: Diagnosis not present

## 2020-07-09 HISTORY — PX: IR NEPHROSTOMY EXCHANGE RIGHT: IMG6070

## 2020-07-09 HISTORY — PX: IR NEPHROSTOMY EXCHANGE LEFT: IMG6069

## 2020-07-09 MED ORDER — LIDOCAINE HCL (PF) 1 % IJ SOLN
INTRAMUSCULAR | Status: AC
  Filled 2020-07-09: qty 30

## 2020-07-09 MED ORDER — IOHEXOL 300 MG/ML  SOLN: 50.0000 mL | Freq: Once | INTRAMUSCULAR | Status: DC | PRN

## 2020-08-20 ENCOUNTER — Other Ambulatory Visit: Payer: Self-pay

## 2020-08-20 ENCOUNTER — Ambulatory Visit (HOSPITAL_COMMUNITY)
Admission: RE | Admit: 2020-08-20 | Discharge: 2020-08-20 | Disposition: A | Discharge status: Home / Self Care | Payer: Medicaid Other | Source: Ambulatory Visit | Attending: Interventional Radiology | Admitting: Interventional Radiology

## 2020-08-20 ENCOUNTER — Other Ambulatory Visit (HOSPITAL_COMMUNITY): Payer: Self-pay | Admitting: Interventional Radiology

## 2020-08-20 DIAGNOSIS — N1330 Unspecified hydronephrosis: Secondary | ICD-10-CM | POA: Insufficient documentation

## 2020-08-20 DIAGNOSIS — Z436 Encounter for attention to other artificial openings of urinary tract: Secondary | ICD-10-CM | POA: Insufficient documentation

## 2020-08-20 HISTORY — PX: IR NEPHROSTOMY EXCHANGE RIGHT: IMG6070

## 2020-08-20 HISTORY — PX: IR NEPHROSTOMY EXCHANGE LEFT: IMG6069

## 2020-08-20 MED ORDER — LIDOCAINE HCL 1 % IJ SOLN
INTRAMUSCULAR | Status: AC
  Filled 2020-08-20: qty 20

## 2020-08-20 MED ORDER — IOHEXOL 300 MG/ML  SOLN
50.0000 mL | Freq: Once | INTRAMUSCULAR | Status: AC | PRN
  Administered 2020-08-20: 15 mL

## 2020-08-20 NOTE — Procedures (Signed)
Interventional Radiology Procedure Note  Procedure: Bilateral nephrostomy drain exchange  Indication: Obstructive uropathy  Findings: Please refer to procedural dictation for full description.  Complications: None  EBL: < 10 mL  Miachel Roux, MD [phone]

## 2020-09-24 ENCOUNTER — Other Ambulatory Visit: Payer: Self-pay

## 2020-09-24 ENCOUNTER — Other Ambulatory Visit (HOSPITAL_COMMUNITY): Payer: Self-pay | Admitting: Interventional Radiology

## 2020-09-24 ENCOUNTER — Ambulatory Visit (HOSPITAL_COMMUNITY)
Admission: RE | Admit: 2020-09-24 | Discharge: 2020-09-24 | Disposition: A | Discharge status: Home / Self Care | Payer: Medicaid Other | Source: Ambulatory Visit | Attending: Interventional Radiology | Admitting: Interventional Radiology

## 2020-09-24 DIAGNOSIS — N1330 Unspecified hydronephrosis: Secondary | ICD-10-CM | POA: Insufficient documentation

## 2020-09-24 DIAGNOSIS — Z436 Encounter for attention to other artificial openings of urinary tract: Secondary | ICD-10-CM | POA: Insufficient documentation

## 2020-09-24 HISTORY — PX: IR NEPHROSTOMY EXCHANGE LEFT: IMG6069

## 2020-09-24 HISTORY — PX: IR NEPHROSTOMY EXCHANGE RIGHT: IMG6070

## 2020-09-24 MED ORDER — LIDOCAINE HCL 1 % IJ SOLN
INTRAMUSCULAR | Status: AC
  Filled 2020-09-24: qty 20

## 2020-09-24 MED ORDER — IOHEXOL 300 MG/ML  SOLN
50.0000 mL | Freq: Once | INTRAMUSCULAR | Status: AC | PRN
  Administered 2020-09-24: 10 mL

## 2020-09-24 NOTE — Procedures (Signed)
Interventional Radiology Procedure Note  Procedure: Image guided routine exchange of bilateral PCN.  New 54F drains.  No suture placed. .  Complications: None  EBL: None    Recommendations: - Routine drain care/exchange patient request 6wk interval - DC home    Signed,  Dulcy Fanny. Earleen Newport, DO

## 2020-10-31 ENCOUNTER — Inpatient Hospital Stay (HOSPITAL_COMMUNITY)
Admission: EM | Admit: 2020-10-31 | Discharge: 2020-11-05 | DRG: 872 | Disposition: A | Discharge status: Home / Self Care | Payer: Medicaid Other | Attending: Family Medicine | Admitting: Family Medicine

## 2020-10-31 ENCOUNTER — Other Ambulatory Visit: Payer: Self-pay

## 2020-10-31 ENCOUNTER — Encounter (HOSPITAL_COMMUNITY): Payer: Self-pay | Admitting: Emergency Medicine

## 2020-10-31 ENCOUNTER — Emergency Department (HOSPITAL_COMMUNITY): Payer: Medicaid Other

## 2020-10-31 DIAGNOSIS — Z936 Other artificial openings of urinary tract status: Secondary | ICD-10-CM

## 2020-10-31 DIAGNOSIS — R636 Underweight: Secondary | ICD-10-CM | POA: Diagnosis present

## 2020-10-31 DIAGNOSIS — E871 Hypo-osmolality and hyponatremia: Secondary | ICD-10-CM | POA: Diagnosis not present

## 2020-10-31 DIAGNOSIS — Z20822 Contact with and (suspected) exposure to covid-19: Secondary | ICD-10-CM | POA: Diagnosis present

## 2020-10-31 DIAGNOSIS — Z923 Personal history of irradiation: Secondary | ICD-10-CM

## 2020-10-31 DIAGNOSIS — D649 Anemia, unspecified: Secondary | ICD-10-CM | POA: Diagnosis not present

## 2020-10-31 DIAGNOSIS — N184 Chronic kidney disease, stage 4 (severe): Secondary | ICD-10-CM

## 2020-10-31 DIAGNOSIS — D631 Anemia in chronic kidney disease: Secondary | ICD-10-CM | POA: Diagnosis present

## 2020-10-31 DIAGNOSIS — Z87440 Personal history of urinary (tract) infections: Secondary | ICD-10-CM

## 2020-10-31 DIAGNOSIS — Z7984 Long term (current) use of oral hypoglycemic drugs: Secondary | ICD-10-CM

## 2020-10-31 DIAGNOSIS — N136 Pyonephrosis: Secondary | ICD-10-CM | POA: Diagnosis present

## 2020-10-31 DIAGNOSIS — A4151 Sepsis due to Escherichia coli [E. coli]: Principal | ICD-10-CM | POA: Diagnosis present

## 2020-10-31 DIAGNOSIS — Z9221 Personal history of antineoplastic chemotherapy: Secondary | ICD-10-CM

## 2020-10-31 DIAGNOSIS — E1122 Type 2 diabetes mellitus with diabetic chronic kidney disease: Secondary | ICD-10-CM | POA: Diagnosis present

## 2020-10-31 DIAGNOSIS — E869 Volume depletion, unspecified: Secondary | ICD-10-CM | POA: Diagnosis present

## 2020-10-31 DIAGNOSIS — A419 Sepsis, unspecified organism: Secondary | ICD-10-CM

## 2020-10-31 DIAGNOSIS — N390 Urinary tract infection, site not specified: Secondary | ICD-10-CM

## 2020-10-31 DIAGNOSIS — D509 Iron deficiency anemia, unspecified: Secondary | ICD-10-CM | POA: Diagnosis present

## 2020-10-31 DIAGNOSIS — Z8541 Personal history of malignant neoplasm of cervix uteri: Secondary | ICD-10-CM

## 2020-10-31 DIAGNOSIS — E872 Acidosis: Secondary | ICD-10-CM | POA: Diagnosis present

## 2020-10-31 DIAGNOSIS — E119 Type 2 diabetes mellitus without complications: Secondary | ICD-10-CM

## 2020-10-31 DIAGNOSIS — Z880 Allergy status to penicillin: Secondary | ICD-10-CM

## 2020-10-31 DIAGNOSIS — Z681 Body mass index (BMI) 19 or less, adult: Secondary | ICD-10-CM

## 2020-10-31 DIAGNOSIS — R509 Fever, unspecified: Secondary | ICD-10-CM

## 2020-10-31 DIAGNOSIS — Z79899 Other long term (current) drug therapy: Secondary | ICD-10-CM

## 2020-10-31 DIAGNOSIS — N1832 Chronic kidney disease, stage 3b: Secondary | ICD-10-CM | POA: Diagnosis present

## 2020-10-31 HISTORY — DX: Chronic kidney disease, unspecified: N18.9

## 2020-10-31 HISTORY — DX: Anemia, unspecified: D64.9

## 2020-10-31 LAB — COMPREHENSIVE METABOLIC PANEL
ALT: 12 U/L (ref 0–44)
AST: 21 U/L (ref 15–41)
Albumin: 3.1 g/dL — ABNORMAL LOW (ref 3.5–5.0)
Alkaline Phosphatase: 107 U/L (ref 38–126)
Anion gap: 9 (ref 5–15)
BUN: 36 mg/dL — ABNORMAL HIGH (ref 8–23)
CO2: 17 mmol/L — ABNORMAL LOW (ref 22–32)
Calcium: 9.1 mg/dL (ref 8.9–10.3)
Chloride: 103 mmol/L (ref 98–111)
Creatinine, Ser: 1.96 mg/dL — ABNORMAL HIGH (ref 0.44–1.00)
GFR, Estimated: 25 mL/min — ABNORMAL LOW (ref >60)
Glucose, Bld: 331 mg/dL — ABNORMAL HIGH (ref 70–99)
Potassium: 4.5 mmol/L (ref 3.5–5.1)
Sodium: 129 mmol/L — ABNORMAL LOW (ref 135–145)
Total Bilirubin: 0.4 mg/dL (ref 0.3–1.2)
Total Protein: 7.5 g/dL (ref 6.5–8.1)

## 2020-10-31 LAB — CBC WITH DIFFERENTIAL/PLATELET
Abs Immature Granulocytes: 0.05 10*3/uL (ref 0.00–0.07)
Basophils Absolute: 0 10*3/uL (ref 0.0–0.1)
Basophils Relative: 0 %
Eosinophils Absolute: 0 10*3/uL (ref 0.0–0.5)
Eosinophils Relative: 0 %
HCT: 31 % — ABNORMAL LOW (ref 36.0–46.0)
Hemoglobin: 10 g/dL — ABNORMAL LOW (ref 12.0–15.0)
Immature Granulocytes: 0 %
Lymphocytes Relative: 3 %
Lymphs Abs: 0.4 10*3/uL — ABNORMAL LOW (ref 0.7–4.0)
MCH: 30.6 pg (ref 26.0–34.0)
MCHC: 32.3 g/dL (ref 30.0–36.0)
MCV: 94.8 fL (ref 80.0–100.0)
Monocytes Absolute: 0.2 10*3/uL (ref 0.1–1.0)
Monocytes Relative: 1 %
Neutro Abs: 11.5 10*3/uL — ABNORMAL HIGH (ref 1.7–7.7)
Neutrophils Relative %: 96 %
Platelets: 202 10*3/uL (ref 150–400)
RBC: 3.27 MIL/uL — ABNORMAL LOW (ref 3.87–5.11)
RDW: 14.2 % (ref 11.5–15.5)
WBC: 12.1 10*3/uL — ABNORMAL HIGH (ref 4.0–10.5)
nRBC: 0 % (ref 0.0–0.2)

## 2020-10-31 LAB — RESP PANEL BY RT-PCR (FLU A&B, COVID) ARPGX2
Influenza A by PCR: NEGATIVE
Influenza B by PCR: NEGATIVE
SARS Coronavirus 2 by RT PCR: NEGATIVE

## 2020-10-31 LAB — APTT: aPTT: 32 seconds (ref 24–36)

## 2020-10-31 LAB — LACTIC ACID, PLASMA
Lactic Acid, Venous: 3.3 mmol/L (ref 0.5–1.9)
Lactic Acid, Venous: 2.2 mmol/L (ref 0.5–1.9)

## 2020-10-31 LAB — PROTIME-INR
INR: 1.2 (ref 0.8–1.2)
Prothrombin Time: 15.1 seconds (ref 11.4–15.2)

## 2020-10-31 MED ORDER — LACTATED RINGERS IV SOLN: INTRAVENOUS | Status: AC

## 2020-10-31 MED ORDER — SODIUM CHLORIDE 0.9 % IV SOLN
2.0000 g | Freq: Once | INTRAVENOUS | Status: AC
  Administered 2020-10-31: 2 g via INTRAVENOUS
  Filled 2020-10-31: qty 2

## 2020-10-31 MED ORDER — SODIUM CHLORIDE 0.9 % IV SOLN: 2.0000 g | Freq: Once | INTRAVENOUS | Status: DC

## 2020-10-31 MED ORDER — LACTATED RINGERS IV BOLUS (SEPSIS)
500.0000 mL | Freq: Once | INTRAVENOUS | Status: AC
  Administered 2020-10-31: 500 mL via INTRAVENOUS

## 2020-10-31 MED ORDER — ACETAMINOPHEN 650 MG RE SUPP: 650.0000 mg | Freq: Four times a day (QID) | RECTAL | Status: DC | PRN

## 2020-10-31 MED ORDER — ENOXAPARIN SODIUM 30 MG/0.3ML IJ SOSY
30.0000 mg | PREFILLED_SYRINGE | INTRAMUSCULAR | Status: DC
  Administered 2020-11-01 – 2020-11-04 (×5): 30 mg via SUBCUTANEOUS
  Filled 2020-10-31 (×4): qty 0.3

## 2020-10-31 MED ORDER — SODIUM CHLORIDE 0.9 % IV SOLN: 2.0000 g | INTRAVENOUS | Status: DC

## 2020-10-31 MED ORDER — SODIUM CHLORIDE 0.9% FLUSH
3.0000 mL | Freq: Two times a day (BID) | INTRAVENOUS | Status: DC
  Administered 2020-11-01 – 2020-11-05 (×9): 3 mL via INTRAVENOUS

## 2020-10-31 MED ORDER — ACETAMINOPHEN 325 MG PO TABS
650.0000 mg | ORAL_TABLET | Freq: Once | ORAL | Status: AC | PRN
  Administered 2020-10-31: 650 mg via ORAL
  Filled 2020-10-31: qty 2

## 2020-10-31 MED ORDER — ACETAMINOPHEN 325 MG PO TABS
650.0000 mg | ORAL_TABLET | Freq: Four times a day (QID) | ORAL | Status: DC | PRN
  Administered 2020-11-01 – 2020-11-02 (×2): 650 mg via ORAL
  Filled 2020-10-31 (×2): qty 2

## 2020-10-31 MED ORDER — LACTATED RINGERS IV BOLUS (SEPSIS)
1000.0000 mL | Freq: Once | INTRAVENOUS | Status: AC
  Administered 2020-10-31: 1000 mL via INTRAVENOUS

## 2020-10-31 MED ORDER — INSULIN ASPART 100 UNIT/ML IJ SOLN
0.0000 [IU] | Freq: Three times a day (TID) | INTRAMUSCULAR | Status: DC
  Administered 2020-11-01: 2 [IU] via SUBCUTANEOUS
  Administered 2020-11-01: 5 [IU] via SUBCUTANEOUS
  Administered 2020-11-01: 1 [IU] via SUBCUTANEOUS
  Administered 2020-11-02: 2 [IU] via SUBCUTANEOUS
  Administered 2020-11-02: 1 [IU] via SUBCUTANEOUS
  Administered 2020-11-03: 2 [IU] via SUBCUTANEOUS
  Administered 2020-11-03: 3 [IU] via SUBCUTANEOUS
  Administered 2020-11-04: 2 [IU] via SUBCUTANEOUS
  Administered 2020-11-04: 1 [IU] via SUBCUTANEOUS
  Administered 2020-11-05: 2 [IU] via SUBCUTANEOUS
  Administered 2020-11-05: 1 [IU] via SUBCUTANEOUS

## 2020-10-31 NOTE — H&P (Signed)
History and Physical   Heidi [name] BJS:[id number] DOB: [birth year] DOA: 10/31/2020  PCP: Patient, No Pcp Per (Inactive)   Patient coming from: Home  Chief Complaint: Lethargy, chills, vomiting  HPI: Heidi [name] is a 80 y.o. female with medical history significant of CKD 3, recurrent UTI, status post nephrostomy tubes, history of vesicular uterine fistula, history of cervical cancer, malnutrition, anemia, diabetes who presents with worsening lethargy decreased activity and chills and vomiting. History provided with the assistance of chart review and family as well as with the assistance of a translator. Patient brought in by family after she had been in bed all day today.  She typically is up and walking and talking.  She is also less talkative and will be less responsive and lethargic.  Was noted to be shivering and felt warm.  Also had an episode of vomiting.  Of note does have a history of recurrent UTI and chronic bilateral nephrostomy tubes.  Majority of history provided by family member.  However patient able to be alert and oriented to self and place but not currently to year.  Family states that this occurs when she has these UTIs but she usually does know the year.   ED Course: Vital signs in the ED significant for temperature of 102.7.  Heart rate initially in the 120s now improved to the 80s with IV fluids.  Blood pressure initially in the 120s now in the 90s but has been stable after IV fluids.  Lab work-up showed CMP with sodium 129 which corrects to 130 3K considering glucose of 331, bicarb 17, BUN 36, creatinine 1.96 from baseline of around 1.7 without 4 years ago.  Hemoglobin stable at 10, leukocytosis to 12.1, albumin 3.1.  Lactic acid 3.3 and then improved to 2.2 on repeat.  Urinalysis, urine cultures and blood cultures pending.  Chest x-ray negative for acute abnormality.  Respiratory panel for flu and COVID pending.  Review of Systems: As per HPI  otherwise all other systems reviewed and are negative.  Past Medical History:  Diagnosis Date   Diabetes mellitus without complication ([place])    Uterine cancer [facility])     Past Surgical History:  Procedure Laterality Date   CYSTOGRAM N/A 10/17/2015   Procedure: CYSTOGRAM;  Surgeon: Alexis Frock, MD;  Location: [facility];  Service: Urology;  Laterality: N/A;   CYSTOSCOPY WITH BIOPSY N/A 10/17/2015   Procedure: CYSTOSCOPY WITH BLADDER BIOPSY AND INGUINAL NODE BIOPSY WITH ULTRASOUND;  Surgeon: Alexis Frock, MD;  Location: [facility];  Service: Urology;  Laterality: N/A;   CYSTOSCOPY WITH FULGERATION N/A 10/17/2015   Procedure: CYSTOSCOPY WITH FULGERATION;  Surgeon: Alexis Frock, MD;  Location: [facility];  Service: Urology;  Laterality: N/A;   IR GENERIC HISTORICAL  11/11/2015   IR NEPHROSTOMY EXCHANGE RIGHT 11/11/2015 Corrie Mckusick, DO [facility]   IR GENERIC HISTORICAL  11/11/2015   IR NEPHROSTOMY EXCHANGE LEFT 11/11/2015 Corrie Mckusick, DO [facility]   IR GENERIC HISTORICAL  01/13/2016   IR NEPHROSTOMY EXCHANGE LEFT 01/13/2016 Markus Daft, MD [facility]   IR GENERIC HISTORICAL  01/13/2016   IR NEPHROSTOMY EXCHANGE RIGHT 01/13/2016 Markus Daft, MD [facility]   IR GENERIC HISTORICAL  03/09/2016   IR NEPHROSTOMY EXCHANGE LEFT 03/09/2016 Marybelle Killings, MD [facility]   IR GENERIC HISTORICAL  03/09/2016   IR NEPHROSTOMY EXCHANGE RIGHT 03/09/2016 Marybelle Killings, MD [facility]   IR GENERIC HISTORICAL  05/04/2016   IR NEPHROSTOMY EXCHANGE RIGHT 05/04/2016 Sandi Mariscal, MD [facility]  IR GENERIC HISTORICAL  05/04/2016   IR NEPHROSTOMY EXCHANGE LEFT 05/04/2016 Sandi Mariscal, MD [facility]   IR NEPHROSTOMY EXCHANGE LEFT  06/29/2016   IR NEPHROSTOMY EXCHANGE LEFT  07/03/2016   IR NEPHROSTOMY EXCHANGE LEFT  08/24/2016   IR NEPHROSTOMY EXCHANGE LEFT  10/28/2016   IR NEPHROSTOMY EXCHANGE LEFT  12/28/2016   IR NEPHROSTOMY EXCHANGE LEFT  04/05/2017   IR NEPHROSTOMY EXCHANGE LEFT  06/03/2017   IR NEPHROSTOMY EXCHANGE  LEFT  08/03/2017   IR NEPHROSTOMY EXCHANGE LEFT  09/30/2017   IR NEPHROSTOMY EXCHANGE LEFT  11/25/2017   IR NEPHROSTOMY EXCHANGE LEFT  01/25/2018   IR NEPHROSTOMY EXCHANGE LEFT  03/07/2018   IR NEPHROSTOMY EXCHANGE LEFT  05/02/2018   IR NEPHROSTOMY EXCHANGE LEFT  08/10/2018   IR NEPHROSTOMY EXCHANGE LEFT  11/10/2018   IR NEPHROSTOMY EXCHANGE LEFT  02/02/2019   IR NEPHROSTOMY EXCHANGE LEFT  06/06/2019   IR NEPHROSTOMY EXCHANGE LEFT  02/01/2020   IR NEPHROSTOMY EXCHANGE LEFT  04/16/2020   IR NEPHROSTOMY EXCHANGE LEFT  05/28/2020   IR NEPHROSTOMY EXCHANGE LEFT  07/09/2020   IR NEPHROSTOMY EXCHANGE LEFT  08/20/2020   IR NEPHROSTOMY EXCHANGE LEFT  09/24/2020   IR NEPHROSTOMY EXCHANGE RIGHT  06/29/2016   IR NEPHROSTOMY EXCHANGE RIGHT  08/24/2016   IR NEPHROSTOMY EXCHANGE RIGHT  10/28/2016   IR NEPHROSTOMY EXCHANGE RIGHT  12/28/2016   IR NEPHROSTOMY EXCHANGE RIGHT  04/05/2017   IR NEPHROSTOMY EXCHANGE RIGHT  06/03/2017   IR NEPHROSTOMY EXCHANGE RIGHT  08/03/2017   IR NEPHROSTOMY EXCHANGE RIGHT  09/30/2017   IR NEPHROSTOMY EXCHANGE RIGHT  11/25/2017   IR NEPHROSTOMY EXCHANGE RIGHT  01/25/2018   IR NEPHROSTOMY EXCHANGE RIGHT  03/07/2018   IR NEPHROSTOMY EXCHANGE RIGHT  05/02/2018   IR NEPHROSTOMY EXCHANGE RIGHT  08/10/2018   IR NEPHROSTOMY EXCHANGE RIGHT  11/10/2018   IR NEPHROSTOMY EXCHANGE RIGHT  02/02/2019   IR NEPHROSTOMY EXCHANGE RIGHT  06/06/2019   IR NEPHROSTOMY EXCHANGE RIGHT  11/09/2019   IR NEPHROSTOMY EXCHANGE RIGHT  04/16/2020   IR NEPHROSTOMY EXCHANGE RIGHT  05/28/2020   IR NEPHROSTOMY EXCHANGE RIGHT  07/09/2020   IR NEPHROSTOMY EXCHANGE RIGHT  08/20/2020   IR NEPHROSTOMY EXCHANGE RIGHT  09/24/2020   IR NEPHROSTOMY PLACEMENT LEFT  02/08/2017   IR NEPHROSTOMY PLACEMENT RIGHT  02/08/2017    Social History  reports that she has never smoked. She has never used smokeless tobacco. She reports that she does not drink alcohol and does not use drugs.  Allergies  Allergen Reactions   Penicillins Itching, Nausea And  Vomiting and Rash    Has patient had a PCN reaction causing immediate rash, facial/tongue/throat swelling, SOB or lightheadedness with hypotension: Yes Has patient had a PCN reaction causing severe rash involving mucus membranes or skin necrosis: No Has patient had a PCN reaction that required hospitalization No Has patient had a PCN reaction occurring within the last 10 years: No If all of the above answers are "NO", then may proceed with Cephalosporin use.     Family History  Problem Relation Age of Onset   Uterine cancer Neg Hx   Reviewed on admission   Prior to Admission medications   Medication Sig Start Date End Date Taking? Authorizing Provider  acetaminophen (TYLENOL) 500 MG tablet Take 500 mg by mouth every 6 (six) hours as needed for mild pain.    [provider]  amLODipine (NORVASC) 2.5 MG tablet Take 1 tablet (2.5 mg total) by mouth daily. 10/18/15   Barton Dubois, MD  feeding supplement, GLUCERNA SHAKE, (GLUCERNA SHAKE) LIQD Take 237 mLs by mouth 2 (two) times daily between meals. 10/18/15   Barton Dubois, MD  glyBURIDE-metformin (GLUCOVANCE) 5-500 MG tablet Take 1 tablet by mouth 2 (two) times daily as needed (for high blood sugar). Blood sugar > 150 then pt takes, if < 95 pt does not take    [provider]  pantoprazole (PROTONIX) 40 MG tablet Take 1 tablet (40 mg total) by mouth daily. Patient not taking: Reported on 11/06/2015 10/18/15   Barton Dubois, MD    Physical Exam: Vitals:   10/31/20 1945 10/31/20 2000 10/31/20 2015 10/31/20 2045  BP: (!) 83/48 (!) 90/56 (!) 90/57 (!) 100/51  Pulse: 92 95 87 83  Resp: (!) 22 17 (!) 22 20  Temp:      SpO2: 98% 99% 99% 100%   Physical Exam Constitutional:      General: She is not in acute distress.    Comments: Thin, frail appealing elderly female.  HENT:     Head: Normocephalic and atraumatic.     Mouth/Throat:     Mouth: Mucous membranes are moist.     Pharynx: Oropharynx is clear.  Eyes:      Extraocular Movements: Extraocular movements intact.     Pupils: Pupils are equal, round, and reactive to light.  Cardiovascular:     Rate and Rhythm: Normal rate and regular rhythm.     Pulses: Normal pulses.     Heart sounds: Normal heart sounds.  Pulmonary:     Effort: Pulmonary effort is normal. No respiratory distress.     Breath sounds: Normal breath sounds.  Abdominal:     General: Bowel sounds are normal. There is no distension.     Palpations: Abdomen is soft.     Tenderness: There is no abdominal tenderness.     Comments:   Nephrostomy tubes in place.  These are changed monthly.  Malodorous smell in the room likely from urine.  Musculoskeletal:        General: No swelling or deformity.  Skin:    General: Skin is warm and dry.  Neurological:     General: No focal deficit present.     Comments: Oriented to person and place but not year.   Labs on Admission: I have personally reviewed following labs and imaging studies  CBC: Recent Labs  Lab 10/31/20 1754  WBC 12.1*  NEUTROABS 11.5*  HGB 10.0*  HCT 31.0*  MCV 94.8  PLT 785    Basic Metabolic Panel: Recent Labs  Lab 10/31/20 1754  NA 129*  K 4.5  CL 103  CO2 17*  GLUCOSE 331*  BUN 36*  CREATININE 1.96*  CALCIUM 9.1    GFR: CrCl cannot be calculated (Unknown ideal weight.).  Liver Function Tests: Recent Labs  Lab 10/31/20 1754  AST 21  ALT 12  ALKPHOS 107  BILITOT 0.4  PROT 7.5  ALBUMIN 3.1*    Urine analysis:    Component Value Date/Time   COLORURINE YELLOW 10/11/2015 1616   APPEARANCEUR TURBID (A) 10/11/2015 1616   LABSPEC 1.017 10/11/2015 1616   PHURINE 6.5 10/11/2015 1616   GLUCOSEU 100 (A) 10/11/2015 1616   HGBUR LARGE (A) 10/11/2015 1616   BILIRUBINUR NEGATIVE 10/11/2015 1616   KETONESUR NEGATIVE 10/11/2015 1616   PROTEINUR >300 (A) 10/11/2015 1616   NITRITE NEGATIVE 10/11/2015 1616   LEUKOCYTESUR LARGE (A) 10/11/2015 1616    Radiological Exams on Admission: DG Chest 1  View  Result Date: 10/31/2020  CLINICAL DATA:  Sepsis EXAM: CHEST  1 VIEW COMPARISON:  01/10/2016 FINDINGS: Heart and mediastinal contours are within normal limits. No focal opacities or effusions. No acute bony abnormality. Aortic atherosclerosis. IMPRESSION: No active cardiopulmonary disease. Electronically Signed   By: Rolm Baptise M.D.   On: 10/31/2020 18:23    EKG: Independently reviewed.  Sinus tachycardia at 124 bpm.  Assessment/Plan Principal Problem:   Sepsis due to urinary tract infection (HCC) Active Problems:   Anemia   Hyponatremia   Diabetes mellitus, type 2 (HCC)  Sepsis due to urinary tract infection > History of recurrent UTIs and urosepsis in the past.  Has chronic bilateral nephrostomy tubes. > Lethargic and decrease talking today noted to have fever and shivering at home.  An episode of vomiting.  Family brought in for evaluation. > Found to have leukocytosis to 12.1, lactic acid of 3.3, tachycardia to the 120s and fever to 102.7 in the ED. > Urine studies are still pending.  Chest x-ray look normal. > Received weight appropriate IV fluid bolus in the ED. - Monitor on progressive unit - Continue IV fluids - Continue cefepime  - Trend fever curve and white count - Follow-up urinalysis, urine culture, blood culture  CKD 3 Hyponatremia > Creatinine relatively stable at 1.96 previous baseline around 1.7 however this was 4 years ago. > Hyponatremia is mild when correcting for glucose sodium is 133. - Avoid nephrotoxic agents - Trend renal function and electrolytes  Diabetes On glyburide and metformin at home - SSI   Anemia > Hemoglobin stable at 10 - Trend CBC  DVT prophylaxis: Heparin  Code Status:   Full Family Communication:  Updated at bedside Disposition Plan:   Patient is from:  Home  Anticipated DC to:  Home  Anticipated DC date:  1 to 4 days  Anticipated DC barriers: None  Consults called:  None  Admission status:  Observation, progressive    Severity of Illness: The appropriate patient status for this patient is OBSERVATION. Observation status is judged to be reasonable and necessary in order to provide the required intensity of service to ensure the patient's safety. The patient's presenting symptoms, physical exam findings, and initial radiographic and laboratory data in the context of their medical condition is felt to place them at decreased risk for further clinical deterioration. Furthermore, it is anticipated that the patient will be medically stable for discharge from the hospital within 2 midnights of admission. The following factors support the patient status of observation.   " The patient's presenting symptoms include lethargy, chills, fever, decreased responsiveness. " The physical exam findings include frail, thin, elderly female.  Oriented to person and place but not year.  Nephrostomy tubes in place.  Malodorous smell in her room, suspected to be urine.. " The initial radiographic and laboratory data are Lab work-up showed CMP with sodium 129 which corrects to 130 3K considering glucose of 331, bicarb 17, BUN 36, creatinine 1.96 from baseline of around 1.7 without 4 years ago.  Hemoglobin stable at 10, leukocytosis to 12.1, albumin 3.1.  Lactic acid 3.3 and then improved to 2.2 on repeat.  Urinalysis, urine cultures and blood cultures pending.  Chest x-ray negative for acute abnormality.   Marcelyn Bruins MD [facility]  How to contact the [facility] Attending or Consulting provider [place] or covering provider during after hours [place], for this patient?   Check the care team in [facility] and look for a) attending/consulting [place] provider listed and b) the  [place] team listed Log into [email or web address] and use [place]'s universal password to access. If you do not have the password, please contact the hospital operator. Locate the [facility] provider you are looking for under [facility] and page to a number that you can be  directly reached. If you still have difficulty reaching the provider, please page the [facility] (Director on Call) for the Hospitalists listed on amion for assistance.  10/31/2020, 9:23 PM

## 2020-10-31 NOTE — ED Triage Notes (Signed)
Pt here with family, who reports pt was in bed "shivering" today and had one episode of vomiting. Pt warm to touch, hx UTIs, nephrostomy tube present.

## 2020-10-31 NOTE — ED Provider Notes (Addendum)
[facility] EMERGENCY DEPARTMENT Provider Note   CSN: [id number] Arrival date & time: 10/31/20  1646     History Chief Complaint  Patient presents with   Emesis    Heidi [name] is a 80 y.o. female.  Pt less responsive, normally walks but has been in bed all day.  Pt has had a fever and vomiting.  Pt has a histroy of urosepsis in the past.  Pt has bilat nephrostomy tubes.   The history is provided by a relative. The history is limited by a language barrier. A language interpreter was used.  Emesis Severity:  Moderate Duration:  1 day Number of daily episodes:  1 Progression:  Unchanged Chronicity:  New Recent urination:  Normal Ineffective treatments:  None tried Risk factors: no sick contacts       Past Medical History:  Diagnosis Date   Diabetes mellitus without complication ([place])    Uterine cancer ([place])     Patient Active Problem List   Diagnosis Date Noted   Lymphadenopathy, inguinal 11/06/2015   Acute renal failure superimposed on stage 3 chronic kidney disease (HCC)    Moderate protein-calorie malnutrition ([place])    Complicated UTI (urinary tract infection) 10/11/2015   Sepsis due to urinary tract infection ([place]) 10/11/2015   Renal insufficiency 10/11/2015   Hyponatremia 10/11/2015   Hypoalbuminemia 10/11/2015   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   Diabetes mellitus, type 2 ([place]) 10/11/2015   History of cervical cancer 07/29/2015   Elevated serum creatinine    Malignancy ([place])    Obstructive uropathy    Hematuria 07/21/2015   UTI (urinary tract infection) 07/21/2015   Hydronephrosis determined by ultrasound 07/21/2015   Symptomatic anemia    Urinary tract infectious disease    Severe sepsis ([place]) 07/20/2015    Past Surgical History:  Procedure Laterality Date   CYSTOGRAM N/A 10/17/2015   Procedure: CYSTOGRAM;  Surgeon: Alexis Frock, MD;  Location: [facility];  Service: Urology;  Laterality: N/A;   CYSTOSCOPY WITH BIOPSY  N/A 10/17/2015   Procedure: CYSTOSCOPY WITH BLADDER BIOPSY AND INGUINAL NODE BIOPSY WITH ULTRASOUND;  Surgeon: Alexis Frock, MD;  Location: [facility];  Service: Urology;  Laterality: N/A;   CYSTOSCOPY WITH FULGERATION N/A 10/17/2015   Procedure: CYSTOSCOPY WITH FULGERATION;  Surgeon: Alexis Frock, MD;  Location: [facility];  Service: Urology;  Laterality: N/A;   IR GENERIC HISTORICAL  11/11/2015   IR NEPHROSTOMY EXCHANGE RIGHT 11/11/2015 Corrie Mckusick, DO [facility]   IR GENERIC HISTORICAL  11/11/2015   IR NEPHROSTOMY EXCHANGE LEFT 11/11/2015 Corrie Mckusick, DO [facility]   IR GENERIC HISTORICAL  01/13/2016   IR NEPHROSTOMY EXCHANGE LEFT 01/13/2016 Markus Daft, MD [facility]   IR GENERIC HISTORICAL  01/13/2016   IR NEPHROSTOMY EXCHANGE RIGHT 01/13/2016 Markus Daft, MD [facility]   IR GENERIC HISTORICAL  03/09/2016   IR NEPHROSTOMY EXCHANGE LEFT 03/09/2016 Marybelle Killings, MD [facility]   IR GENERIC HISTORICAL  03/09/2016   IR NEPHROSTOMY EXCHANGE RIGHT 03/09/2016 Marybelle Killings, MD [facility]   IR GENERIC HISTORICAL  05/04/2016   IR NEPHROSTOMY EXCHANGE RIGHT 05/04/2016 Sandi Mariscal, MD [facility]   IR GENERIC HISTORICAL  05/04/2016   IR NEPHROSTOMY EXCHANGE LEFT 05/04/2016 Sandi Mariscal, MD [facility]   IR NEPHROSTOMY EXCHANGE LEFT  06/29/2016   IR NEPHROSTOMY EXCHANGE LEFT  07/03/2016   IR NEPHROSTOMY EXCHANGE LEFT  08/24/2016   IR NEPHROSTOMY EXCHANGE LEFT  10/28/2016   IR NEPHROSTOMY EXCHANGE LEFT  12/28/2016   IR NEPHROSTOMY EXCHANGE  LEFT  04/05/2017   IR NEPHROSTOMY EXCHANGE LEFT  06/03/2017   IR NEPHROSTOMY EXCHANGE LEFT  08/03/2017   IR NEPHROSTOMY EXCHANGE LEFT  09/30/2017   IR NEPHROSTOMY EXCHANGE LEFT  11/25/2017   IR NEPHROSTOMY EXCHANGE LEFT  01/25/2018   IR NEPHROSTOMY EXCHANGE LEFT  03/07/2018   IR NEPHROSTOMY EXCHANGE LEFT  05/02/2018   IR NEPHROSTOMY EXCHANGE LEFT  08/10/2018   IR NEPHROSTOMY EXCHANGE LEFT  11/10/2018   IR NEPHROSTOMY EXCHANGE LEFT  02/02/2019   IR NEPHROSTOMY EXCHANGE  LEFT  06/06/2019   IR NEPHROSTOMY EXCHANGE LEFT  02/01/2020   IR NEPHROSTOMY EXCHANGE LEFT  04/16/2020   IR NEPHROSTOMY EXCHANGE LEFT  05/28/2020   IR NEPHROSTOMY EXCHANGE LEFT  07/09/2020   IR NEPHROSTOMY EXCHANGE LEFT  08/20/2020   IR NEPHROSTOMY EXCHANGE LEFT  09/24/2020   IR NEPHROSTOMY EXCHANGE RIGHT  06/29/2016   IR NEPHROSTOMY EXCHANGE RIGHT  08/24/2016   IR NEPHROSTOMY EXCHANGE RIGHT  10/28/2016   IR NEPHROSTOMY EXCHANGE RIGHT  12/28/2016   IR NEPHROSTOMY EXCHANGE RIGHT  04/05/2017   IR NEPHROSTOMY EXCHANGE RIGHT  06/03/2017   IR NEPHROSTOMY EXCHANGE RIGHT  08/03/2017   IR NEPHROSTOMY EXCHANGE RIGHT  09/30/2017   IR NEPHROSTOMY EXCHANGE RIGHT  11/25/2017   IR NEPHROSTOMY EXCHANGE RIGHT  01/25/2018   IR NEPHROSTOMY EXCHANGE RIGHT  03/07/2018   IR NEPHROSTOMY EXCHANGE RIGHT  05/02/2018   IR NEPHROSTOMY EXCHANGE RIGHT  08/10/2018   IR NEPHROSTOMY EXCHANGE RIGHT  11/10/2018   IR NEPHROSTOMY EXCHANGE RIGHT  02/02/2019   IR NEPHROSTOMY EXCHANGE RIGHT  06/06/2019   IR NEPHROSTOMY EXCHANGE RIGHT  11/09/2019   IR NEPHROSTOMY EXCHANGE RIGHT  04/16/2020   IR NEPHROSTOMY EXCHANGE RIGHT  05/28/2020   IR NEPHROSTOMY EXCHANGE RIGHT  07/09/2020   IR NEPHROSTOMY EXCHANGE RIGHT  08/20/2020   IR NEPHROSTOMY EXCHANGE RIGHT  09/24/2020   IR NEPHROSTOMY PLACEMENT LEFT  02/08/2017   IR NEPHROSTOMY PLACEMENT RIGHT  02/08/2017     OB History   No obstetric history on file.     No family history on file.  Social History   Tobacco Use   Smoking status: Never   Smokeless tobacco: Never  Substance Use Topics   Alcohol use: No   Drug use: No    Home Medications Prior to Admission medications   Medication Sig Start Date End Date Taking? Authorizing Provider  acetaminophen (TYLENOL) 500 MG tablet Take 500 mg by mouth every 6 (six) hours as needed for mild pain.    [provider]  amLODipine (NORVASC) 2.5 MG tablet Take 1 tablet (2.5 mg total) by mouth daily. 10/18/15   Barton Dubois, MD  feeding supplement,  GLUCERNA SHAKE, (GLUCERNA SHAKE) LIQD Take 237 mLs by mouth 2 (two) times daily between meals. 10/18/15   Barton Dubois, MD  glyBURIDE-metformin (GLUCOVANCE) 5-500 MG tablet Take 1 tablet by mouth 2 (two) times daily as needed (for high blood sugar). Blood sugar > 150 then pt takes, if < 95 pt does not take    [provider]  pantoprazole (PROTONIX) 40 MG tablet Take 1 tablet (40 mg total) by mouth daily. Patient not taking: Reported on 11/06/2015 10/18/15   Barton Dubois, MD    Allergies    Penicillins  Review of Systems   Review of Systems  Gastrointestinal:  Positive for vomiting.  All other systems reviewed and are negative.  Physical Exam Updated Vital Signs BP (!) 83/48   Pulse 92   Temp (!) 102.7 F (39.3 C)  Resp (!) 22   SpO2 98%   Physical Exam Vitals and nursing note reviewed.  Constitutional:      Appearance: She is well-developed.  HENT:     Head: Normocephalic.     Mouth/Throat:     Mouth: Mucous membranes are moist.  Cardiovascular:     Rate and Rhythm: Normal rate.  Pulmonary:     Effort: Pulmonary effort is normal.  Abdominal:     General: There is no distension.  Musculoskeletal:        General: Normal range of motion.     Cervical back: Normal range of motion.  Skin:    General: Skin is warm.  Neurological:     General: No focal deficit present.     Mental Status: She is alert and oriented to person, place, and time.  Psychiatric:        Mood and Affect: Mood normal.    ED Results / Procedures / Treatments   Labs (all labs ordered are listed, but only abnormal results are displayed) Labs Reviewed  LACTIC ACID, PLASMA - Abnormal; Notable for the following components:      Result Value   Lactic Acid, Venous 3.3 (*)    All other components within normal limits  COMPREHENSIVE METABOLIC PANEL - Abnormal; Notable for the following components:   Sodium 129 (*)    CO2 17 (*)    Glucose, Bld 331 (*)    BUN 36 (*)    Creatinine, Ser  1.96 (*)    Albumin 3.1 (*)    GFR, Estimated 25 (*)    All other components within normal limits  CBC WITH DIFFERENTIAL/PLATELET - Abnormal; Notable for the following components:   WBC 12.1 (*)    RBC 3.27 (*)    Hemoglobin 10.0 (*)    HCT 31.0 (*)    Neutro Abs 11.5 (*)    Lymphs Abs 0.4 (*)    All other components within normal limits  URINE CULTURE  CULTURE, BLOOD (ROUTINE X 2)  CULTURE, BLOOD (ROUTINE X 2)  RESP PANEL BY RT-PCR (FLU A&B, COVID) ARPGX2  PROTIME-INR  APTT  LACTIC ACID, PLASMA  URINALYSIS, ROUTINE W REFLEX MICROSCOPIC    EKG None  Radiology DG Chest 1 View  Result Date: 10/31/2020 CLINICAL DATA:  Sepsis EXAM: CHEST  1 VIEW COMPARISON:  01/10/2016 FINDINGS: Heart and mediastinal contours are within normal limits. No focal opacities or effusions. No acute bony abnormality. Aortic atherosclerosis. IMPRESSION: No active cardiopulmonary disease. Electronically Signed   By: Rolm Baptise M.D.   On: 10/31/2020 18:23    Procedures .Critical Care  Date/Time: 10/31/2020 8:54 PM Performed by: Fransico Meadow, PA-C Authorized by: Fransico Meadow, PA-C   Critical care provider statement:    Critical care time (minutes):  45   Critical care start time:  10/31/2020 7:00 PM   Critical care end time:  10/31/2020 8:54 PM   Critical care was necessary to treat or prevent imminent or life-threatening deterioration of the following conditions:  Sepsis   Critical care was time spent personally by me on the following activities:  Development of treatment plan with patient or surrogate, discussions with consultants, evaluation of patient's response to treatment, interpretation of cardiac output measurements, obtaining history from patient or surrogate, ordering and performing treatments and interventions, ordering and review of laboratory studies, ordering and review of radiographic studies, pulse oximetry, re-evaluation of patient's condition and review of old charts   Care  discussed with: admitting provider  Medications Ordered in ED Medications  lactated ringers infusion (has no administration in time range)  lactated ringers bolus 1,000 mL (has no administration in time range)    And  lactated ringers bolus 500 mL (has no administration in time range)  ceFEPIme (MAXIPIME) 2 g in sodium chloride 0.9 % 100 mL IVPB (has no administration in time range)  acetaminophen (TYLENOL) tablet 650 mg (650 mg Oral Given 10/31/20 1723)    ED Course  I have reviewed the triage vital signs and the nursing notes.  Pertinent labs & imaging results that were available during my care of the patient were reviewed by me and considered in my medical decision making (see chart for details).    MDM Rules/Calculators/A&P                           MDM:  Pt has a temp of 102.  Lactic acid of 3.3.  Sepsis orders initiated.  IV fluids and antibiotic ordered.  I suspect urine as source.  Unassigned medicine consulted for admission.   Final Clinical Impression(s) / ED Diagnoses Final diagnoses:  Sepsis [facility])    Rx / [place] Orders ED Discharge Orders     None        Sidney Ace 10/31/20 2052    Sidney Ace 10/31/20 2055    Pattricia Boss, MD 11/05/20 [phone]

## 2020-10-31 NOTE — Progress Notes (Signed)
Pharmacy Antibiotic Note  Heidi [name] is a 80 y.o. female admitted on 10/31/2020 with UTI.  Pharmacy has been consulted for Aztreonam dosing if patient has no history of cephalosporin receipt. Chart review reveals multiple doses of cephalosporins, per consult will change to cefepime.  Patient presenting with shivering and vomiting. Patient history notable for bilateral nephrostomy tubes and urosepsis.  SCr 1.96. WBC 12.1.  Plan: Cefepime 2g q24hr unless change in renal function Monitor renal function F/u urine culture De-escalate antibiotics as appropriate     Temp (24hrs), Avg:102.7 F (39.3 C), Min:102.7 F (39.3 C), Max:102.7 F (39.3 C)  Recent Labs  Lab 10/31/20 1747 10/31/20 1754  WBC  --  12.1*  CREATININE  --  1.96*  LATICACIDVEN 3.3*  --     CrCl cannot be calculated (Unknown ideal weight.).    Allergies  Allergen Reactions   Penicillins Itching, Nausea And Vomiting and Rash    Has patient had a PCN reaction causing immediate rash, facial/tongue/throat swelling, SOB or lightheadedness with hypotension: Yes Has patient had a PCN reaction causing severe rash involving mucus membranes or skin necrosis: No Has patient had a PCN reaction that required hospitalization No Has patient had a PCN reaction occurring within the last 10 years: No If all of the above answers are "NO", then may proceed with Cephalosporin use.     Antimicrobials this admission: Cefepime 8/11 >>   Microbiology results: Pending  Thank you for allowing pharmacy to be a part of this patient's care.  Lorelei Pont, PharmD, BCPS 10/31/2020 8:50 PM ED Clinical Pharmacist -  [phone]

## 2020-10-31 NOTE — ED Provider Notes (Signed)
Emergency Medicine Provider Triage Evaluation Note  Heidi [name] , a 80 y.o. female  was evaluated in triage.  Pt complains of fever and vomiting.  Patient family member states that patient was in bed shivering today and had 2 episodes of vomiting.  Denies any hematemesis or coffee-ground emesis.  Patient denies any abdominal pain, dysuria, hematuria, urinary frequency, cough, rhinorrhea, nasal congestion  Review of Systems  Positive: Fevers, chills, nausea and vomiting Negative: bdominal pain, dysuria, hematuria, urinary frequency, cough, rhinorrhea, nasal congestion  Physical Exam  BP 129/62 (BP Location: Right Arm)   Pulse (!) 125   Temp (!) 102.7 F (39.3 C)   Resp 20   SpO2 97%  Gen:   Awake, no distress   Resp:  Normal effort, lungs clear to auscultation bilaterally MSK:   Moves extremities without difficulty  Other:  Abdomen soft, nondistended, nontender.  Patient has nephrostomy bags bilaterally.  Medical Decision Making  Medically screening exam initiated at 5:21 PM.  Appropriate orders placed.  Teleshia Lynde Ludwig was informed that the remainder of the evaluation will be completed by another provider, this initial triage assessment does not replace that evaluation, and the importance of remaining in the ED until their evaluation is complete.  Concern for possible sepsis.  ED evolving sepsis initiated.  Will make patient level 2 and move back to next available room.     Loni Beckwith, PA-C 10/31/20 1724    Drenda Freeze, MD 10/31/20 [phone]

## 2020-10-31 NOTE — Sepsis Progress Note (Signed)
Code Sepsis initiated @ 1946 PM, [place] following.

## 2020-11-01 ENCOUNTER — Encounter (HOSPITAL_COMMUNITY): Payer: Self-pay | Admitting: Internal Medicine

## 2020-11-01 DIAGNOSIS — Z79899 Other long term (current) drug therapy: Secondary | ICD-10-CM | POA: Diagnosis not present

## 2020-11-01 DIAGNOSIS — N390 Urinary tract infection, site not specified: Secondary | ICD-10-CM | POA: Diagnosis not present

## 2020-11-01 DIAGNOSIS — E869 Volume depletion, unspecified: Secondary | ICD-10-CM | POA: Diagnosis present

## 2020-11-01 DIAGNOSIS — R636 Underweight: Secondary | ICD-10-CM | POA: Diagnosis present

## 2020-11-01 DIAGNOSIS — Z87440 Personal history of urinary (tract) infections: Secondary | ICD-10-CM | POA: Diagnosis not present

## 2020-11-01 DIAGNOSIS — Z20822 Contact with and (suspected) exposure to covid-19: Secondary | ICD-10-CM | POA: Diagnosis present

## 2020-11-01 DIAGNOSIS — D631 Anemia in chronic kidney disease: Secondary | ICD-10-CM | POA: Diagnosis present

## 2020-11-01 DIAGNOSIS — A4151 Sepsis due to Escherichia coli [E. coli]: Secondary | ICD-10-CM | POA: Diagnosis present

## 2020-11-01 DIAGNOSIS — D509 Iron deficiency anemia, unspecified: Secondary | ICD-10-CM | POA: Diagnosis present

## 2020-11-01 DIAGNOSIS — E1122 Type 2 diabetes mellitus with diabetic chronic kidney disease: Secondary | ICD-10-CM | POA: Diagnosis present

## 2020-11-01 DIAGNOSIS — E872 Acidosis: Secondary | ICD-10-CM | POA: Diagnosis present

## 2020-11-01 DIAGNOSIS — Z8541 Personal history of malignant neoplasm of cervix uteri: Secondary | ICD-10-CM | POA: Diagnosis not present

## 2020-11-01 DIAGNOSIS — Z9221 Personal history of antineoplastic chemotherapy: Secondary | ICD-10-CM | POA: Diagnosis not present

## 2020-11-01 DIAGNOSIS — Z936 Other artificial openings of urinary tract status: Secondary | ICD-10-CM | POA: Diagnosis not present

## 2020-11-01 DIAGNOSIS — A419 Sepsis, unspecified organism: Secondary | ICD-10-CM | POA: Diagnosis present

## 2020-11-01 DIAGNOSIS — E871 Hypo-osmolality and hyponatremia: Secondary | ICD-10-CM | POA: Diagnosis present

## 2020-11-01 DIAGNOSIS — N184 Chronic kidney disease, stage 4 (severe): Secondary | ICD-10-CM | POA: Diagnosis not present

## 2020-11-01 DIAGNOSIS — N1832 Chronic kidney disease, stage 3b: Secondary | ICD-10-CM | POA: Diagnosis present

## 2020-11-01 DIAGNOSIS — Z681 Body mass index (BMI) 19 or less, adult: Secondary | ICD-10-CM | POA: Diagnosis not present

## 2020-11-01 DIAGNOSIS — Z923 Personal history of irradiation: Secondary | ICD-10-CM | POA: Diagnosis not present

## 2020-11-01 DIAGNOSIS — N136 Pyonephrosis: Secondary | ICD-10-CM | POA: Diagnosis present

## 2020-11-01 DIAGNOSIS — Z7984 Long term (current) use of oral hypoglycemic drugs: Secondary | ICD-10-CM | POA: Diagnosis not present

## 2020-11-01 DIAGNOSIS — Z880 Allergy status to penicillin: Secondary | ICD-10-CM | POA: Diagnosis not present

## 2020-11-01 LAB — BLOOD CULTURE ID PANEL (REFLEXED) - BCID2

## 2020-11-01 LAB — COMPREHENSIVE METABOLIC PANEL WITH GFR
ALT: 10 U/L (ref 0–44)
AST: 17 U/L (ref 15–41)
Albumin: 2.4 g/dL — ABNORMAL LOW (ref 3.5–5.0)
Alkaline Phosphatase: 78 U/L (ref 38–126)
Anion gap: 9 (ref 5–15)
BUN: 33 mg/dL — ABNORMAL HIGH (ref 8–23)
CO2: 19 mmol/L — ABNORMAL LOW (ref 22–32)
Calcium: 8.7 mg/dL — ABNORMAL LOW (ref 8.9–10.3)
Chloride: 106 mmol/L (ref 98–111)
Creatinine, Ser: 1.79 mg/dL — ABNORMAL HIGH (ref 0.44–1.00)
GFR, Estimated: 28 mL/min — ABNORMAL LOW
Glucose, Bld: 231 mg/dL — ABNORMAL HIGH (ref 70–99)
Potassium: 4.1 mmol/L (ref 3.5–5.1)
Sodium: 134 mmol/L — ABNORMAL LOW (ref 135–145)
Total Bilirubin: 0.3 mg/dL (ref 0.3–1.2)
Total Protein: 5.9 g/dL — ABNORMAL LOW (ref 6.5–8.1)

## 2020-11-01 LAB — CBG MONITORING, ED
Glucose-Capillary: 175 mg/dL — ABNORMAL HIGH (ref 70–99)
Glucose-Capillary: 191 mg/dL — ABNORMAL HIGH (ref 70–99)

## 2020-11-01 LAB — URINALYSIS, ROUTINE W REFLEX MICROSCOPIC
Bilirubin Urine: NEGATIVE
Glucose, UA: >=500 mg/dL — AB
Ketones, ur: NEGATIVE mg/dL
Nitrite: NEGATIVE
Protein, ur: 100 mg/dL — AB
Specific Gravity, Urine: 1.005 (ref 1.005–1.030)
pH: 9 — ABNORMAL HIGH (ref 5.0–8.0)

## 2020-11-01 LAB — CBC
HCT: 25.7 % — ABNORMAL LOW (ref 36.0–46.0)
Hemoglobin: 8.4 g/dL — ABNORMAL LOW (ref 12.0–15.0)
MCH: 30.8 pg (ref 26.0–34.0)
MCHC: 32.7 g/dL (ref 30.0–36.0)
MCV: 94.1 fL (ref 80.0–100.0)
Platelets: 159 10*3/uL (ref 150–400)
RBC: 2.73 MIL/uL — ABNORMAL LOW (ref 3.87–5.11)
RDW: 14.4 % (ref 11.5–15.5)
WBC: 11.7 10*3/uL — ABNORMAL HIGH (ref 4.0–10.5)
nRBC: 0 % (ref 0.0–0.2)

## 2020-11-01 LAB — CORTISOL-AM, BLOOD: Cortisol - AM: 25.9 ug/dL — ABNORMAL HIGH (ref 6.7–22.6)

## 2020-11-01 LAB — PROTIME-INR
INR: 1.4 — ABNORMAL HIGH (ref 0.8–1.2)
Prothrombin Time: 16.9 s — ABNORMAL HIGH (ref 11.4–15.2)

## 2020-11-01 LAB — GLUCOSE, CAPILLARY
Glucose-Capillary: 128 mg/dL — ABNORMAL HIGH (ref 70–99)
Glucose-Capillary: 101 mg/dL — ABNORMAL HIGH (ref 70–99)

## 2020-11-01 LAB — PROCALCITONIN: Procalcitonin: 37.26 ng/mL

## 2020-11-01 MED ORDER — SODIUM CHLORIDE 0.9 % IV SOLN
2.0000 g | INTRAVENOUS | Status: DC
  Administered 2020-11-01 – 2020-11-03 (×3): 2 g via INTRAVENOUS
  Filled 2020-11-01 (×3): qty 20

## 2020-11-01 MED ORDER — SODIUM CHLORIDE 0.9 % IV BOLUS
1000.0000 mL | Freq: Once | INTRAVENOUS | Status: AC
  Administered 2020-11-01: 1000 mL via INTRAVENOUS

## 2020-11-01 NOTE — Progress Notes (Signed)
Old drsg was wet with urine.

## 2020-11-01 NOTE — ED Notes (Signed)
Report given. RN states the room is not ready at this time.

## 2020-11-01 NOTE — Progress Notes (Signed)
Daughter with pt

## 2020-11-01 NOTE — Sepsis Progress Note (Signed)
[place] Code Sepsis Completion Note:  Pt had BC drawn before ABX administration. Received adequate fluid resuscitation and LA down to 2.2 from 3.3. Being admitted to a progressive unit for further monitoring.  Tawnia Schirm DNP Warren Lacy RN [phone] AM

## 2020-11-01 NOTE — Progress Notes (Signed)
PROGRESS NOTE    Heidi [name]  QVZ:[id number] DOB: 07/22/1940 DOA: 10/31/2020 PCP: Patient, No Pcp Per (Inactive) (   Chief Complaint  Patient presents with   Emesis    Brief Narrative:    Heidi [name] is a 80 y.o. female with medical history significant of CKD 3, recurrent UTI, status post nephrostomy tubes, history of vesicular uterine fistula, history of cervical cancer, malnutrition, anemia, diabetes who presents with worsening lethargy decreased activity and chills and vomiting.  Work-up was significant for sepsis due to UTI, she was admitted for further management.  Assessment & Plan:   Principal Problem:   Sepsis due to urinary tract infection ([place]) Active Problems:   Anemia   Hyponatremia   Diabetes mellitus, type 2 (HCC)  Sepsis due to urinary tract infection -History of recurrent UTIs and urosepsis in the past.  Has chronic bilateral nephrostomy tubes. -presents with lethargy, decreased oral intake, -  Found to have leukocytosis to 12.1, lactic acid of 3.3, tachycardia to the 120s and fever to 102.7 in the ED. -Urine culture growing E. coli, will narrow cefepime to Rocephin. -Continue with IV fluids -Procalcitonin significantly elevated, continue to trend.   CKD 3 -Creatinine around baseline, continue with IV fluid and avoid nephrotoxic medications  Hyponatremia -Volume depletion, continue with IV fluids  Diabetes On glyburide and metformin at home - SSI   Anemia > Hemoglobin stable at 10 - Trend CBC   DVT prophylaxis: (Lovenox/Heparin/SCD's/anticoagulated/None (if comfort care) Code Status: (Full/Partial - specify details) Family Communication: (Specify name, relationship & date discussed. NO "discussed with patient") Disposition:   Status is: Inpatient  Remains inpatient appropriate because:IV treatments appropriate due to intensity of illness or inability to take PO  Dispo: The patient is from: Home               Anticipated d/c is to: Home              Patient currently is not medically stable to d/c.   Difficult to place patient No       Consultants:  none    Subjective:  She reports generalized weakness and fatigue  Objective: Vitals:   11/01/20 1330 11/01/20 1345 11/01/20 1400 11/01/20 1415  BP: (!) 120/58 (!) 117/48 (!) 109/50 (!) 117/46  Pulse: 91 88 86 92  Resp: (!) 27 (!) 22 (!) 23 (!) 25  Temp:   98 F (36.7 C)   TempSrc:      SpO2: 98% 96% 97% 96%  Weight:      Height:        Intake/Output Summary (Last 24 hours) at 11/01/2020 1609 Last data filed at 10/31/2020 2051 Gross per 24 hour  Intake 1200 ml  Output --  Net 1200 ml   Filed Weights   10/31/20 2133  Weight: 45.4 kg    Examination:  Awake Alert, Oriented X 3, well, pleasant Symmetrical Chest wall movement, Good air movement bilaterally, CTAB RRR,No Gallops,Rubs or new Murmurs, No Parasternal Heave +ve B.Sounds, Abd Soft, No tenderness, B/L nephrostomy tube present  No Cyanosis, Clubbing or edema, No new Rash or bruise      Data Reviewed: I have personally reviewed following labs and imaging studies  CBC: Recent Labs  Lab 10/31/20 1754 11/01/20 0416  WBC 12.1* 11.7*  NEUTROABS 11.5*  --   HGB 10.0* 8.4*  HCT 31.0* 25.7*  MCV 94.8 94.1  PLT 202 329    Basic Metabolic Panel: Recent Labs  Lab 10/31/20 1754 11/01/20  0416  NA 129* 134*  K 4.5 4.1  CL 103 106  CO2 17* 19*  GLUCOSE 331* 231*  BUN 36* 33*  CREATININE 1.96* 1.79*  CALCIUM 9.1 8.7*    GFR: Estimated Creatinine Clearance: 17.7 mL/min (A) (by C-G formula based on SCr of 1.79 mg/dL (H)).  Liver Function Tests: Recent Labs  Lab 10/31/20 1754 11/01/20 0416  AST 21 17  ALT 12 10  ALKPHOS 107 78  BILITOT 0.4 0.3  PROT 7.5 5.9*  ALBUMIN 3.1* 2.4*    CBG: Recent Labs  Lab 11/01/20 0742 11/01/20 1156  GLUCAP 175* 191*     Recent Results (from the past 240 hour(s))  Blood Culture (routine x 2)     Status: None  (Preliminary result)   Collection Time: 10/31/20  5:18 PM   Specimen: BLOOD LEFT ARM  Result Value Ref Range Status   Specimen Description BLOOD LEFT ARM  Final   Special Requests   Final    BOTTLES DRAWN AEROBIC AND ANAEROBIC Blood Culture adequate volume   Culture  Setup Time   Final    GRAM NEGATIVE RODS ANAEROBIC BOTTLE ONLY CRITICAL RESULT CALLED TO, READ BACK BY AND VERIFIED WITH: PHARMD CATHY PIERCE 11/01/20@8 :36 BY TW GRAM VARIABLE ROD AEROBIC BOTTLE ONLY Performed at [facility], [place] [address], [place], [place] [address]    Culture GRAM NEGATIVE RODS  Final   Report Status PENDING  Incomplete  Blood Culture ID Panel (Reflexed)     Status: Abnormal   Collection Time: 10/31/20  5:18 PM  Result Value Ref Range Status   Enterococcus faecalis NOT DETECTED NOT DETECTED Final   Enterococcus Faecium NOT DETECTED NOT DETECTED Final   Listeria monocytogenes NOT DETECTED NOT DETECTED Final   Staphylococcus species NOT DETECTED NOT DETECTED Final   Staphylococcus aureus (BCID) NOT DETECTED NOT DETECTED Final   Staphylococcus epidermidis NOT DETECTED NOT DETECTED Final   Staphylococcus lugdunensis NOT DETECTED NOT DETECTED Final   Streptococcus species NOT DETECTED NOT DETECTED Final   Streptococcus agalactiae NOT DETECTED NOT DETECTED Final   Streptococcus pneumoniae NOT DETECTED NOT DETECTED Final   Streptococcus pyogenes NOT DETECTED NOT DETECTED Final   A.calcoaceticus-baumannii NOT DETECTED NOT DETECTED Final   Bacteroides fragilis NOT DETECTED NOT DETECTED Final   Enterobacterales DETECTED (A) NOT DETECTED Final    Comment: Enterobacterales represent a large order of gram negative bacteria, not a single organism. CRITICAL RESULT CALLED TO, READ BACK BY AND VERIFIED WITH: PHARMD CATHY PIERCE 11/01/20@8 :36 BY TW    Enterobacter cloacae complex NOT DETECTED NOT DETECTED Final   Escherichia coli DETECTED (A) NOT DETECTED Final    Comment: CRITICAL RESULT CALLED TO, READ  BACK BY AND VERIFIED WITH: PHARMD CATHY PIERCE 11/01/20@8 :36 BY TW    Klebsiella aerogenes NOT DETECTED NOT DETECTED Final   Klebsiella oxytoca NOT DETECTED NOT DETECTED Final   Klebsiella pneumoniae NOT DETECTED NOT DETECTED Final   Proteus species NOT DETECTED NOT DETECTED Final   Salmonella species NOT DETECTED NOT DETECTED Final   Serratia marcescens NOT DETECTED NOT DETECTED Final   Haemophilus influenzae NOT DETECTED NOT DETECTED Final   Neisseria meningitidis NOT DETECTED NOT DETECTED Final   Pseudomonas aeruginosa NOT DETECTED NOT DETECTED Final   Stenotrophomonas maltophilia NOT DETECTED NOT DETECTED Final   Candida albicans NOT DETECTED NOT DETECTED Final   Candida auris NOT DETECTED NOT DETECTED Final   Candida glabrata NOT DETECTED NOT DETECTED Final   Candida krusei NOT DETECTED NOT DETECTED Final  Candida parapsilosis NOT DETECTED NOT DETECTED Final   Candida tropicalis NOT DETECTED NOT DETECTED Final   Cryptococcus neoformans/gattii NOT DETECTED NOT DETECTED Final   CTX-M ESBL NOT DETECTED NOT DETECTED Final   Carbapenem resistance IMP NOT DETECTED NOT DETECTED Final   Carbapenem resistance KPC NOT DETECTED NOT DETECTED Final   Carbapenem resistance NDM NOT DETECTED NOT DETECTED Final   Carbapenem resist OXA 48 LIKE NOT DETECTED NOT DETECTED Final   Carbapenem resistance VIM NOT DETECTED NOT DETECTED Final    Comment: Performed at [facility], [place] [address] [place], [place] [address]  Resp Panel by RT-PCR (Flu A&B, Covid) Nasopharyngeal Swab     Status: None   Collection Time: 10/31/20  5:22 PM   Specimen: Nasopharyngeal Swab; Nasopharyngeal(NP) swabs in vial transport medium  Result Value Ref Range Status   SARS Coronavirus 2 by RT PCR NEGATIVE NEGATIVE Final    Comment: (NOTE) SARS-CoV-2 target nucleic acids are NOT DETECTED.  The SARS-CoV-2 RNA is generally detectable in upper respiratory specimens during the acute phase of infection. The  lowest concentration of SARS-CoV-2 viral copies this assay can detect is 138 copies/mL. A negative result does not preclude SARS-Cov-2 infection and should not be used as the sole basis for treatment or other patient management decisions. A negative result may occur with  improper specimen collection/handling, submission of specimen other than nasopharyngeal swab, presence of viral mutation(s) within the areas targeted by this assay, and inadequate number of viral copies(<138 copies/mL). A negative result must be combined with clinical observations, patient history, and epidemiological information. The expected result is Negative.  Fact Sheet for Patients:  EntrepreneurPulse.com.au  Fact Sheet for Healthcare Providers:  IncredibleEmployment.be  This test is no t yet approved or cleared by the [place] FDA and  has been authorized for detection and/or diagnosis of SARS-CoV-2 by FDA under an Emergency Use Authorization (EUA). This EUA will remain  in effect (meaning this test can be used) for the duration of the COVID-19 declaration under Section 564(b)(1) of the Act, 21 U.S.C.section 360bbb-3(b)(1), unless the authorization is terminated  or revoked sooner.       Influenza A by PCR NEGATIVE NEGATIVE Final   Influenza B by PCR NEGATIVE NEGATIVE Final    Comment: (NOTE) The Xpert Xpress SARS-CoV-2/FLU/RSV plus assay is intended as an aid in the diagnosis of influenza from Nasopharyngeal swab specimens and should not be used as a sole basis for treatment. Nasal washings and aspirates are unacceptable for Xpert Xpress SARS-CoV-2/FLU/RSV testing.  Fact Sheet for Patients: EntrepreneurPulse.com.au  Fact Sheet for Healthcare Providers: IncredibleEmployment.be  This test is not yet approved or cleared by the [place] FDA and has been authorized for detection and/or diagnosis of SARS-CoV-2 by FDA under  an Emergency Use Authorization (EUA). This EUA will remain in effect (meaning this test can be used) for the duration of the COVID-19 declaration under Section 564(b)(1) of the Act, 21 U.S.C. section 360bbb-3(b)(1), unless the authorization is terminated or revoked.  Performed at [facility], [place] [address], [place], [place] [address]   Blood Culture (routine x 2)     Status: None (Preliminary result)   Collection Time: 10/31/20  7:30 PM   Specimen: BLOOD  Result Value Ref Range Status   Specimen Description BLOOD LEFT FOREARM  Final   Special Requests   Final    BOTTLES DRAWN AEROBIC AND ANAEROBIC Blood Culture adequate volume   Culture   Final    NO  GROWTH < 24 HOURS Performed at [facility], [place] [address], [place], [place] [address]    Report Status PENDING  Incomplete         Radiology Studies: DG Chest 1 View  Result Date: 10/31/2020 CLINICAL DATA:  Sepsis EXAM: CHEST  1 VIEW COMPARISON:  01/10/2016 FINDINGS: Heart and mediastinal contours are within normal limits. No focal opacities or effusions. No acute bony abnormality. Aortic atherosclerosis. IMPRESSION: No active cardiopulmonary disease. Electronically Signed   By: Rolm Baptise M.D.   On: 10/31/2020 18:23        Scheduled Meds:  enoxaparin (LOVENOX) injection  30 mg Subcutaneous Q24H   insulin aspart  0-9 Units Subcutaneous TID WC   sodium chloride flush  3 mL Intravenous Q12H   Continuous Infusions:  cefTRIAXone (ROCEPHIN)  IV Stopped (11/01/20 1158)     LOS: 0 days       Phillips Climes, MD [facility]   To contact the attending provider between 7A-7P or the covering provider during after hours 7P-7A, please log into the web site [email or web address] and access using universal [place] password for that web site. If you do not have the password, please call the hospital operator.  11/01/2020, 4:09 PM

## 2020-11-01 NOTE — ED Notes (Signed)
Called to give report. RN in patient care. Extension given for call back.

## 2020-11-01 NOTE — Progress Notes (Signed)
Drsg changed

## 2020-11-01 NOTE — Progress Notes (Signed)
PHARMACY - PHYSICIAN COMMUNICATION CRITICAL VALUE ALERT - BLOOD CULTURE IDENTIFICATION (BCID)  Heidi [name] is an 80 y.o. female who presented to [facility] on 10/31/2020 with a chief complaint of fever and vomiting. History of UTIs and nephrostomy tube present.  Assessment:   1/4 Bcx Ecoli 1/4 gram variable rods BCID detected E.coli, no resistance   Name of physician (or Provider) Contacted: Elgergawy, Dawood MD  Current antibiotics:  Cefepime 2gm IV q24h  Changes to prescribed antibiotics recommended:  Stop Cefepime 2g IV q24H Start Ceftriaxone 2g IV q24h  Results for orders placed or performed during the hospital encounter of 10/31/20  Blood Culture ID Panel (Reflexed) (Collected: 10/31/2020  5:18 PM)  Result Value Ref Range   Enterococcus faecalis NOT DETECTED NOT DETECTED   Enterococcus Faecium NOT DETECTED NOT DETECTED   Listeria monocytogenes NOT DETECTED NOT DETECTED   Staphylococcus species NOT DETECTED NOT DETECTED   Staphylococcus aureus (BCID) NOT DETECTED NOT DETECTED   Staphylococcus epidermidis NOT DETECTED NOT DETECTED   Staphylococcus lugdunensis NOT DETECTED NOT DETECTED   Streptococcus species NOT DETECTED NOT DETECTED   Streptococcus agalactiae NOT DETECTED NOT DETECTED   Streptococcus pneumoniae NOT DETECTED NOT DETECTED   Streptococcus pyogenes NOT DETECTED NOT DETECTED   A.calcoaceticus-baumannii NOT DETECTED NOT DETECTED   Bacteroides fragilis NOT DETECTED NOT DETECTED   Enterobacterales DETECTED (A) NOT DETECTED   Enterobacter cloacae complex NOT DETECTED NOT DETECTED   Escherichia coli DETECTED (A) NOT DETECTED   Klebsiella aerogenes NOT DETECTED NOT DETECTED   Klebsiella oxytoca NOT DETECTED NOT DETECTED   Klebsiella pneumoniae NOT DETECTED NOT DETECTED   Proteus species NOT DETECTED NOT DETECTED   Salmonella species NOT DETECTED NOT DETECTED   Serratia marcescens NOT DETECTED NOT DETECTED   Haemophilus influenzae NOT DETECTED NOT  DETECTED   Neisseria meningitidis NOT DETECTED NOT DETECTED   Pseudomonas aeruginosa NOT DETECTED NOT DETECTED   Stenotrophomonas maltophilia NOT DETECTED NOT DETECTED   Candida albicans NOT DETECTED NOT DETECTED   Candida auris NOT DETECTED NOT DETECTED   Candida glabrata NOT DETECTED NOT DETECTED   Candida krusei NOT DETECTED NOT DETECTED   Candida parapsilosis NOT DETECTED NOT DETECTED   Candida tropicalis NOT DETECTED NOT DETECTED   Cryptococcus neoformans/gattii NOT DETECTED NOT DETECTED   CTX-M ESBL NOT DETECTED NOT DETECTED   Carbapenem resistance IMP NOT DETECTED NOT DETECTED   Carbapenem resistance KPC NOT DETECTED NOT DETECTED   Carbapenem resistance NDM NOT DETECTED NOT DETECTED   Carbapenem resist OXA 48 LIKE NOT DETECTED NOT DETECTED   Carbapenem resistance VIM NOT DETECTED NOT DETECTED   Lestine Box, PharmD PGY2 Infectious Diseases Pharmacy Resident   Please check [email or web address] for unit-specific pharmacy phone numbers

## 2020-11-02 ENCOUNTER — Inpatient Hospital Stay (HOSPITAL_COMMUNITY): Payer: Medicaid Other

## 2020-11-02 LAB — URINE CULTURE

## 2020-11-02 LAB — BASIC METABOLIC PANEL
Anion gap: 6 (ref 5–15)
BUN: 25 mg/dL — ABNORMAL HIGH (ref 8–23)
CO2: 21 mmol/L — ABNORMAL LOW (ref 22–32)
Calcium: 8.3 mg/dL — ABNORMAL LOW (ref 8.9–10.3)
Chloride: 109 mmol/L (ref 98–111)
Creatinine, Ser: 1.71 mg/dL — ABNORMAL HIGH (ref 0.44–1.00)
GFR, Estimated: 30 mL/min — ABNORMAL LOW (ref >60)
Glucose, Bld: 83 mg/dL (ref 70–99)
Potassium: 3.5 mmol/L (ref 3.5–5.1)
Sodium: 136 mmol/L (ref 135–145)

## 2020-11-02 LAB — C DIFFICILE QUICK SCREEN W PCR REFLEX
C Diff antigen: NEGATIVE
C Diff interpretation: NOT DETECTED
C Diff toxin: NEGATIVE

## 2020-11-02 LAB — URINALYSIS, ROUTINE W REFLEX MICROSCOPIC
Bilirubin Urine: NEGATIVE
Glucose, UA: NEGATIVE mg/dL
Ketones, ur: NEGATIVE mg/dL
Nitrite: NEGATIVE
Protein, ur: 100 mg/dL — AB
Specific Gravity, Urine: 1.011 (ref 1.005–1.030)
WBC, UA: >50 WBC/hpf — ABNORMAL HIGH (ref 0–5)
pH: 8 (ref 5.0–8.0)

## 2020-11-02 LAB — GLUCOSE, CAPILLARY
Glucose-Capillary: 70 mg/dL (ref 70–99)
Glucose-Capillary: 151 mg/dL — ABNORMAL HIGH (ref 70–99)
Glucose-Capillary: 125 mg/dL — ABNORMAL HIGH (ref 70–99)

## 2020-11-02 LAB — CBC
HCT: 24.2 % — ABNORMAL LOW (ref 36.0–46.0)
Hemoglobin: 7.8 g/dL — ABNORMAL LOW (ref 12.0–15.0)
MCH: 29.7 pg (ref 26.0–34.0)
MCHC: 32.2 g/dL (ref 30.0–36.0)
MCV: 92 fL (ref 80.0–100.0)
Platelets: 134 10*3/uL — ABNORMAL LOW (ref 150–400)
RBC: 2.63 MIL/uL — ABNORMAL LOW (ref 3.87–5.11)
RDW: 14.5 % (ref 11.5–15.5)
WBC: 7.9 10*3/uL (ref 4.0–10.5)
nRBC: 0 % (ref 0.0–0.2)

## 2020-11-02 LAB — PROCALCITONIN: Procalcitonin: 33.07 ng/mL

## 2020-11-02 NOTE — Progress Notes (Signed)
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  Pro-Cal elevated consistent with sepsis.  Urine culture mixed organism however blood culture GNR pending C/S.  Leukocytosis has resolved, afebrile and stable hemodynamically. continue on ceftriaxone. her nephrostomy tubes were placed by Dr Pascal Lux on 7/5 and has f/u with IR 11/05/20 for exchange and we may need to contact IR Monday while she is here.Will check US renal.  Recent Labs  Lab 10/31/20 1747 10/31/20 1754 10/31/20 1918 11/01/20 0416 11/02/20 0057  WBC  --  12.1*  --  11.7* 7.9  LATICACIDVEN 3.3*  --  2.2*  --   --   PROCALCITON  --   --   --  37.26 33.07    Anemia: Hemoglobin downtrending since admission.  Baseline hemoglobin in July 9 to 10 g. Check anemia panel.  Has previous transfusion 4 yrs ago. No hx of colonoscopy. If further downtrending Recent Labs  Lab 10/31/20 1754 11/01/20 0416 11/02/20 0057   HGB 10.0* 8.4* 7.8*  HCT 31.0* 88.5* 02.7*   Metabolic acidosis: bicarb 19 in 21s-suspect in the setting of CKD.  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  Overall stable. Recent Labs  Lab 10/31/20 1754 11/01/20 0416 11/02/20 0057  BUN 36* 33* 25*  CREATININE 1.96* 1.79* 1.71*    Hyponatremia: Resolved Diarhea 8/11- c diff ordered, no recurrence   Diabetes mellitus, type 2: Blood sugar well controlled on glyburide, metformin at home-holding for now, continue sliding scale. Recent Labs  Lab 11/01/20 0742 11/01/20 1156 11/01/20 1715 11/01/20 2219 11/02/20 0803  GLUCAP 175* 191* 128* 101* 70    Diet Order             Diet heart healthy/carb modified Room service appropriate? Yes; Fluid consistency: Thin  Diet effective now                  Patient's Body mass index is 18.29 kg/m. DVT prophylaxis: enoxaparin (LOVENOX) injection 30 mg Start: 10/31/20 2100 Code Status:   Code Status: Full Code  Family Communication: plan of care discussed with patient and her daughter at bedside. Status is: Inpatient Remains inpatient appropriate because:IV treatments appropriate due to intensity of illness or inability to take PO and Inpatient level of care appropriate due to severity of illness Dispo: The patient is from: Home              Anticipated d/c is to: Home              Patient currently is not medically stable to d/c.  Difficult to place patient No    Unresulted Labs (From admission, onward)     Start     Ordered   11/07/20 0500  Creatinine, serum  (enoxaparin (LOVENOX)    CrCl < 30 ml/min)  Weekly,   R     Comments: while on enoxaparin therapy.    10/31/20 2053   11/03/20 0500  Vitamin B12  (Anemia Panel (PNL))  Tomorrow morning,   R        11/02/20 0816   11/03/20 0500  Folate  (Anemia Panel (PNL))  Tomorrow morning,   R        11/02/20 0816   11/03/20 0500  Iron and TIBC  (Anemia Panel (PNL))  Tomorrow morning,   R        11/02/20 0816    11/03/20 0500  Ferritin  (Anemia Panel (PNL))  Tomorrow morning,   R        11/02/20 0816   11/03/20 0500  Reticulocytes  (Anemia Panel (PNL))  Tomorrow morning,   R        11/02/20 0816   11/03/20 0500  CBC  Tomorrow morning,   R        11/02/20 0816   11/03/20 2831  Basic metabolic panel  Tomorrow morning,   R        11/02/20 0816   11/02/20 0500  Procalcitonin  Daily,   R      11/01/20 1618   11/01/20 0534  C Difficile Quick Screen w PCR reflex  (C Difficile quick screen w PCR reflex panel )  Once, for 24 hours,   STAT       References:    CDiff Information Tool   11/01/20 0534            Medications reviewed:  Scheduled Meds:  enoxaparin (LOVENOX) injection  30 mg Subcutaneous Q24H   insulin aspart  0-9 Units Subcutaneous TID WC   sodium chloride flush  3 mL Intravenous Q12H   Continuous Infusions:  cefTRIAXone (ROCEPHIN)  IV Stopped (11/01/20 1153)   Consultants:see note  Procedures:see note Antimicrobials: Anti-infectives (From admission, onward)    Start     Dose/Rate Route Frequency Ordered Stop   11/01/20 2000  ceFEPIme (MAXIPIME) 2 g in sodium chloride 0.9 % 100 mL IVPB  Status:  Discontinued        2 g 200 mL/hr over 30 Minutes Intravenous Every 24 hours 10/31/20 2135 11/01/20 0940   11/01/20 1000  cefTRIAXone (ROCEPHIN) 2 g in sodium chloride 0.9 % 100 mL IVPB        2 g 200 mL/hr over 30 Minutes Intravenous Every 24 hours 11/01/20 0940     10/31/20 2000  aztreonam (AZACTAM) 2 g in sodium chloride 0.9 % 100 mL IVPB  Status:  Discontinued        2 g 200 mL/hr over 30 Minutes Intravenous  Once 10/31/20 1946 10/31/20 1950   10/31/20 2000  ceFEPIme (MAXIPIME) 2 g in sodium chloride 0.9 % 100 mL IVPB        2 g 200 mL/hr over 30 Minutes Intravenous  Once 10/31/20 1950 10/31/20 2051      Culture/Microbiology    Component Value Date/Time   SDES IN/OUT CATH URINE 10/31/2020 2327   SPECREQUEST  10/31/2020 2327    NONE Performed at [facility],  [place] [address], [place], [place] [address]    CULT MULTIPLE SPECIES PRESENT, SUGGEST RECOLLECTION (A) 10/31/2020 2327   REPTSTATUS  11/02/2020 FINAL 10/31/2020 2327    Other culture-see note  Objective: Vitals: Today's Vitals   11/01/20 2200 11/02/20 0035 11/02/20 0348 11/02/20 0800  BP:  (!) 113/45 (!) 115/53 (!) 116/54  Pulse:  86 80 79  Resp:  (!) 21 16 (!) 23  Temp:  98.9 F (37.2 C) 97.8 F (36.6 C) 100 F (37.8 C)  TempSrc:  Axillary Axillary Oral  SpO2:  97% 96% 98%  Weight:      Height:      PainSc: 0-No pain 0-No pain 0-No pain     Intake/Output Summary (Last 24 hours) at 11/02/2020 0846 Last data filed at 11/02/2020 0300 Gross per 24 hour  Intake 104.97 ml  Output 425 ml  Net -320.03 ml   Filed Weights   10/31/20 2133  Weight: 45.4 kg   Weight change:   Intake/Output from previous day: 08/12 0701 - 08/13 0700 In: 105 [IV Piggyback:105] Out: 425 [Urine:425] Intake/Output this shift: No intake/output data recorded. Filed Weights   10/31/20 2133  Weight: 45.4 kg   Examination: General exam: AAO x3, elderly, older than stated age, weak appearing. HEENT:Oral mucosa moist, Ear/Nose WNL grossly,dentition normal. Respiratory system: bilaterally diminished,no use of accessory muscle, non tender. Cardiovascular system: S1 & S2 +,no JVD. Gastrointestinal system: Abdomen soft, NT,ND, BS+. Nervous System:Alert, awake, moving extremities Extremities: no edema, distal peripheral pulses palpable.  Skin: No rashes,no icterus. MSK: Normal muscle bulk,tone, power Data Reviewed: I have personally reviewed following labs and imaging studies CBC: Recent Labs  Lab 10/31/20 1754 11/01/20 0416 11/02/20 0057  WBC 12.1* 11.7* 7.9  NEUTROABS 11.5*  --   --   HGB 10.0* 8.4* 7.8*  HCT 31.0* 25.7* 24.2*  MCV 94.8 94.1 92.0  PLT 202 159 425*   Basic Metabolic Panel: Recent Labs  Lab 10/31/20 1754 11/01/20 0416 11/02/20 0057  NA 129* 134* 136  K 4.5 4.1 3.5  CL 103 106  109  CO2 17* 19* 21*  GLUCOSE 331* 231* 83  BUN 36* 33* 25*  CREATININE 1.96* 1.79* 1.71*  CALCIUM 9.1 8.7* 8.3*   GFR: Estimated Creatinine Clearance: 18.5 mL/min (A) (by C-G formula based on SCr of 1.71 mg/dL (H)). Liver Function Tests: Recent Labs  Lab 10/31/20 1754 11/01/20 0416  AST 21 17  ALT 12 10  ALKPHOS 107 78  BILITOT 0.4 0.3  PROT 7.5 5.9*  ALBUMIN 3.1* 2.4*   No results for input(s): LIPASE, AMYLASE in the last 168 hours. No results for input(s): AMMONIA in the last 168 hours. Coagulation Profile: Recent Labs  Lab 10/31/20 1754 11/01/20 0416  INR 1.2 1.4*   Cardiac Enzymes: No results for input(s): CKTOTAL, CKMB, CKMBINDEX, TROPONINI in the last 168 hours. BNP (last 3 results) No results for input(s): PROBNP in the last 8760 hours. HbA1C: No results for input(s): HGBA1C in the last 72 hours. CBG: Recent Labs  Lab 11/01/20 0742 11/01/20 1156 11/01/20 1715 11/01/20 2219 11/02/20 0803  GLUCAP 175* 191* 128* 101* 70   Lipid Profile: No results for input(s): CHOL, HDL, LDLCALC, TRIG, CHOLHDL, LDLDIRECT in the last 72 hours. Thyroid Function Tests: No results for input(s): TSH, T4TOTAL, FREET4, T3FREE, THYROIDAB in the last 72 hours. Anemia Panel: No results for input(s): VITAMINB12, FOLATE, FERRITIN, TIBC, IRON, RETICCTPCT in the last 72 hours. Sepsis Labs: Recent Labs  Lab 10/31/20 1747 10/31/20 1918 11/01/20 0416 11/02/20 0057  PROCALCITON  --   --  37.26 33.07  LATICACIDVEN 3.3* 2.2*  --   --  Recent Results (from the past 240 hour(s))  Blood Culture (routine x 2)     Status: None (Preliminary result)   Collection Time: 10/31/20  5:18 PM   Specimen: BLOOD LEFT ARM  Result Value Ref Range Status   Specimen Description BLOOD LEFT ARM  Final   Special Requests   Final    BOTTLES DRAWN AEROBIC AND ANAEROBIC Blood Culture adequate volume   Culture  Setup Time   Final    GRAM NEGATIVE RODS ANAEROBIC BOTTLE ONLY CRITICAL RESULT CALLED  TO, READ BACK BY AND VERIFIED WITH: PHARMD CATHY PIERCE 11/01/20_0 :75 BY TW GRAM VARIABLE ROD AEROBIC BOTTLE ONLY Performed at [facility], [place] [address], [place], [place] [address]    Culture GRAM NEGATIVE RODS  Final   Report Status PENDING  Incomplete  Blood Culture ID Panel (Reflexed)     Status: Abnormal   Collection Time: 10/31/20  5:18 PM  Result Value Ref Range Status   Enterococcus faecalis NOT DETECTED NOT DETECTED Final   Enterococcus Faecium NOT DETECTED NOT DETECTED Final   Listeria monocytogenes NOT DETECTED NOT DETECTED Final   Staphylococcus species NOT DETECTED NOT DETECTED Final   Staphylococcus aureus (BCID) NOT DETECTED NOT DETECTED Final   Staphylococcus epidermidis NOT DETECTED NOT DETECTED Final   Staphylococcus lugdunensis NOT DETECTED NOT DETECTED Final   Streptococcus species NOT DETECTED NOT DETECTED Final   Streptococcus agalactiae NOT DETECTED NOT DETECTED Final   Streptococcus pneumoniae NOT DETECTED NOT DETECTED Final   Streptococcus pyogenes NOT DETECTED NOT DETECTED Final   A.calcoaceticus-baumannii NOT DETECTED NOT DETECTED Final   Bacteroides fragilis NOT DETECTED NOT DETECTED Final   Enterobacterales DETECTED (A) NOT DETECTED Final    Comment: Enterobacterales represent a large order of gram negative bacteria, not a single organism. CRITICAL RESULT CALLED TO, READ BACK BY AND VERIFIED WITH: PHARMD CATHY PIERCE 11/01/20_1 :36 BY TW    Enterobacter cloacae complex NOT DETECTED NOT DETECTED Final   Escherichia coli DETECTED (A) NOT DETECTED Final    Comment: CRITICAL RESULT CALLED TO, READ BACK BY AND VERIFIED WITH: PHARMD CATHY PIERCE 11/01/20_2 :36 BY TW    Klebsiella aerogenes NOT DETECTED NOT DETECTED Final   Klebsiella oxytoca NOT DETECTED NOT DETECTED Final   Klebsiella pneumoniae NOT DETECTED NOT DETECTED Final   Proteus species NOT DETECTED NOT DETECTED Final   Salmonella species NOT DETECTED NOT DETECTED Final   Serratia marcescens  NOT DETECTED NOT DETECTED Final   Haemophilus influenzae NOT DETECTED NOT DETECTED Final   Neisseria meningitidis NOT DETECTED NOT DETECTED Final   Pseudomonas aeruginosa NOT DETECTED NOT DETECTED Final   Stenotrophomonas maltophilia NOT DETECTED NOT DETECTED Final   Candida albicans NOT DETECTED NOT DETECTED Final   Candida auris NOT DETECTED NOT DETECTED Final   Candida glabrata NOT DETECTED NOT DETECTED Final   Candida krusei NOT DETECTED NOT DETECTED Final   Candida parapsilosis NOT DETECTED NOT DETECTED Final   Candida tropicalis NOT DETECTED NOT DETECTED Final   Cryptococcus neoformans/gattii NOT DETECTED NOT DETECTED Final   CTX-M ESBL NOT DETECTED NOT DETECTED Final   Carbapenem resistance IMP NOT DETECTED NOT DETECTED Final   Carbapenem resistance KPC NOT DETECTED NOT DETECTED Final   Carbapenem resistance NDM NOT DETECTED NOT DETECTED Final   Carbapenem resist OXA 48 LIKE NOT DETECTED NOT DETECTED Final   Carbapenem resistance VIM NOT DETECTED NOT DETECTED Final    Comment: Performed at [facility], [address] [place], [place] [address]  Resp Panel by  RT-PCR (Flu A&B, Covid) Nasopharyngeal Swab     Status: None   Collection Time: 10/31/20  5:22 PM   Specimen: Nasopharyngeal Swab; Nasopharyngeal(NP) swabs in vial transport medium  Result Value Ref Range Status   SARS Coronavirus 2 by RT PCR NEGATIVE NEGATIVE Final    Comment: (NOTE) SARS-CoV-2 target nucleic acids are NOT DETECTED.  The SARS-CoV-2 RNA is generally detectable in upper respiratory specimens during the acute phase of infection. The lowest concentration of SARS-CoV-2 viral copies this assay can detect is 138 copies/mL. A negative result does not preclude SARS-Cov-2 infection and should not be used as the sole basis for treatment or other patient management decisions. A negative result may occur with  improper specimen collection/handling, submission of specimen other than nasopharyngeal swab,  presence of viral mutation(s) within the areas targeted by this assay, and inadequate number of viral copies(<138 copies/mL). A negative result must be combined with clinical observations, patient history, and epidemiological information. The expected result is Negative.  Fact Sheet for Patients:  EntrepreneurPulse.com.au  Fact Sheet for Healthcare Providers:  IncredibleEmployment.be  This test is no t yet approved or cleared by the [place] FDA and  has been authorized for detection and/or diagnosis of SARS-CoV-2 by FDA under an Emergency Use Authorization (EUA). This EUA will remain  in effect (meaning this test can be used) for the duration of the COVID-19 declaration under Section 564(b)(1) of the Act, 21 U.S.C.section 360bbb-3(b)(1), unless the authorization is terminated  or revoked sooner.       Influenza A by PCR NEGATIVE NEGATIVE Final   Influenza B by PCR NEGATIVE NEGATIVE Final    Comment: (NOTE) The Xpert Xpress SARS-CoV-2/FLU/RSV plus assay is intended as an aid in the diagnosis of influenza from Nasopharyngeal swab specimens and should not be used as a sole basis for treatment. Nasal washings and aspirates are unacceptable for Xpert Xpress SARS-CoV-2/FLU/RSV testing.  Fact Sheet for Patients: EntrepreneurPulse.com.au  Fact Sheet for Healthcare Providers: IncredibleEmployment.be  This test is not yet approved or cleared by the [place] FDA and has been authorized for detection and/or diagnosis of SARS-CoV-2 by FDA under an Emergency Use Authorization (EUA). This EUA will remain in effect (meaning this test can be used) for the duration of the COVID-19 declaration under Section 564(b)(1) of the Act, 21 U.S.C. section 360bbb-3(b)(1), unless the authorization is terminated or revoked.  Performed at [facility], [place] [address], [place], [place] [address]   Blood Culture  (routine x 2)     Status: None (Preliminary result)   Collection Time: 10/31/20  7:30 PM   Specimen: BLOOD  Result Value Ref Range Status   Specimen Description BLOOD LEFT FOREARM  Final   Special Requests   Final    BOTTLES DRAWN AEROBIC AND ANAEROBIC Blood Culture adequate volume   Culture  Setup Time   Final    GRAM NEGATIVE RODS AEROBIC BOTTLE ONLY CRITICAL VALUE NOTED.  VALUE IS CONSISTENT WITH PREVIOUSLY REPORTED AND CALLED VALUE. Performed at [facility], [place] [address], [place], [place] [address]    Culture GRAM NEGATIVE RODS  Final   Report Status PENDING  Incomplete  Urine Culture     Status: Abnormal   Collection Time: 10/31/20 11:27 PM   Specimen: In/Out Cath Urine  Result Value Ref Range Status   Specimen Description IN/OUT CATH URINE  Final   Special Requests   Final    NONE Performed at [facility], [place]  [address], [place], [place] [address]    Culture MULTIPLE SPECIES PRESENT, SUGGEST RECOLLECTION (A)  Final   Report Status 11/02/2020 FINAL  Final     Radiology Studies: DG Chest 1 View  Result Date: 10/31/2020 CLINICAL DATA:  Sepsis EXAM: CHEST  1 VIEW COMPARISON:  01/10/2016 FINDINGS: Heart and mediastinal contours are within normal limits. No focal opacities or effusions. No acute bony abnormality. Aortic atherosclerosis. IMPRESSION: No active cardiopulmonary disease. Electronically Signed   By: Rolm Baptise M.D.   On: 10/31/2020 18:23     LOS: 1 day   Antonieta Pert, MD [facility]  11/02/2020, 8:46 AM

## 2020-11-03 ENCOUNTER — Inpatient Hospital Stay (HOSPITAL_COMMUNITY): Payer: Medicaid Other

## 2020-11-03 HISTORY — PX: IR NEPHROSTOMY EXCHANGE RIGHT: IMG6070

## 2020-11-03 HISTORY — PX: IR NEPHROSTOMY EXCHANGE LEFT: IMG6069

## 2020-11-03 LAB — IRON AND TIBC
Iron: 17 ug/dL — ABNORMAL LOW (ref 28–170)
Saturation Ratios: 9 % — ABNORMAL LOW (ref 10.4–31.8)
TIBC: 188 ug/dL — ABNORMAL LOW (ref 250–450)
UIBC: 171 ug/dL

## 2020-11-03 LAB — BASIC METABOLIC PANEL
Anion gap: 8 (ref 5–15)
BUN: 25 mg/dL — ABNORMAL HIGH (ref 8–23)
CO2: 19 mmol/L — ABNORMAL LOW (ref 22–32)
Calcium: 8.4 mg/dL — ABNORMAL LOW (ref 8.9–10.3)
Chloride: 109 mmol/L (ref 98–111)
Creatinine, Ser: 1.87 mg/dL — ABNORMAL HIGH (ref 0.44–1.00)
GFR, Estimated: 27 mL/min — ABNORMAL LOW (ref >60)
Glucose, Bld: 97 mg/dL (ref 70–99)
Potassium: 3.7 mmol/L (ref 3.5–5.1)
Sodium: 136 mmol/L (ref 135–145)

## 2020-11-03 LAB — CULTURE, BLOOD (ROUTINE X 2): Special Requests: ADEQUATE

## 2020-11-03 LAB — RETICULOCYTES
Immature Retic Fract: 11.4 % (ref 2.3–15.9)
RBC.: 2.87 MIL/uL — ABNORMAL LOW (ref 3.87–5.11)
Retic Count, Absolute: 54.5 10*3/uL (ref 19.0–186.0)
Retic Ct Pct: 1.9 % (ref 0.4–3.1)

## 2020-11-03 LAB — CBC
HCT: 27.5 % — ABNORMAL LOW (ref 36.0–46.0)
Hemoglobin: 9 g/dL — ABNORMAL LOW (ref 12.0–15.0)
MCH: 30.5 pg (ref 26.0–34.0)
MCHC: 32.7 g/dL (ref 30.0–36.0)
MCV: 93.2 fL (ref 80.0–100.0)
Platelets: 136 10*3/uL — ABNORMAL LOW (ref 150–400)
RBC: 2.95 MIL/uL — ABNORMAL LOW (ref 3.87–5.11)
RDW: 14.5 % (ref 11.5–15.5)
WBC: 5.7 10*3/uL (ref 4.0–10.5)
nRBC: 0 % (ref 0.0–0.2)

## 2020-11-03 LAB — VITAMIN B12: Vitamin B-12: 741 pg/mL (ref 180–914)

## 2020-11-03 LAB — GLUCOSE, CAPILLARY
Glucose-Capillary: 118 mg/dL — ABNORMAL HIGH (ref 70–99)
Glucose-Capillary: 203 mg/dL — ABNORMAL HIGH (ref 70–99)
Glucose-Capillary: 182 mg/dL — ABNORMAL HIGH (ref 70–99)
Glucose-Capillary: 146 mg/dL — ABNORMAL HIGH (ref 70–99)

## 2020-11-03 LAB — FOLATE: Folate: 12.3 ng/mL (ref >5.9)

## 2020-11-03 LAB — FERRITIN: Ferritin: 163 ng/mL (ref 11–307)

## 2020-11-03 LAB — PROCALCITONIN: Procalcitonin: 21.93 ng/mL

## 2020-11-03 MED ORDER — LIDOCAINE HCL 1 % IJ SOLN
INTRAMUSCULAR | Status: AC
  Filled 2020-11-03: qty 20

## 2020-11-03 MED ORDER — LIDOCAINE HCL 1 % IJ SOLN
INTRAMUSCULAR | Status: DC | PRN
  Administered 2020-11-03: 10 mL

## 2020-11-03 MED ORDER — IOHEXOL 300 MG/ML  SOLN
50.0000 mL | Freq: Once | INTRAMUSCULAR | Status: AC | PRN
  Administered 2020-11-03: 10 mL

## 2020-11-03 MED ORDER — CEFAZOLIN SODIUM-DEXTROSE 1-4 GM/50ML-% IV SOLN
1.0000 g | Freq: Two times a day (BID) | INTRAVENOUS | Status: DC
  Administered 2020-11-04: 1 g via INTRAVENOUS
  Filled 2020-11-03: qty 50

## 2020-11-03 NOTE — Procedures (Signed)
Interventional Radiology Procedure Note  Procedure: Image guided exchange of bilateral PCN.  64F drains to gravity.  Complications: None  Recommendations: - Routine PCN care    Signed,  Dulcy Fanny. Earleen [place], DO

## 2020-11-03 NOTE — Progress Notes (Signed)
Pharmacy Antibiotic Note  Female Heidi [name] is a 80 y.o. female admitted on 10/31/2020 with urosepsis. Pharmacy has been consulted to narrow Ceftriaxone to Cefazolin given susceptibility results.   Of noting, the patient does have chronic B/L nephrostomy tubes with original plan for exchange on 8/16, may do while inpatient since admitted. Blood cultures growing E.coli that is pan-sensitive. SCr stable in 1.7-1.9 range.   Last Rocephin dose this AM - will plan to transition to Cefazolin tomorrow.  Plan: - D/c Rocephin - Start Cefazolin 1g IV every 12 hours - Can consider transition to oral to complete treatment course when more stable - Will continue to follow renal function, culture results, LOT, and antibiotic de-escalation plans   Height: 5\' 2"  (157.5 cm) Weight: 45.4 kg (100 lb) IBW/kg (Calculated) : 50.1  Temp (24hrs), Avg:99.2 F (37.3 C), Min:98.1 F (36.7 C), Max:102.9 F (39.4 C)  Recent Labs  Lab 10/31/20 1747 10/31/20 1754 10/31/20 1918 11/01/20 0416 11/02/20 0057 11/03/20 0123  WBC  --  12.1*  --  11.7* 7.9 5.7  CREATININE  --  1.96*  --  1.79* 1.71* 1.87*  LATICACIDVEN 3.3*  --  2.2*  --   --   --     Estimated Creatinine Clearance: 16.9 mL/min (A) (by C-G formula based on SCr of 1.87 mg/dL (H)).    Allergies  Allergen Reactions   Penicillins Itching, Nausea And Vomiting and Rash    Has patient had a PCN reaction causing immediate rash, facial/tongue/throat swelling, SOB or lightheadedness with hypotension: Yes Has patient had a PCN reaction causing severe rash involving mucus membranes or skin necrosis: No Has patient had a PCN reaction that required hospitalization No Has patient had a PCN reaction occurring within the last 10 years: No If all of the above answers are "NO", then may proceed with Cephalosporin use.     Antimicrobials this admission: Cefepime 8/11 x 1 CTX 8/12 >> 8/14 Cefazolin 8/15 >>   Dose adjustments this  admission:   Microbiology results: 8/11 Influenza/COVID >> neg 8/11 UCx >> mult species 8/11 BCx >> E.coli (pan-sensitive) 8/13 CDiff >> neg  Thank you for allowing pharmacy to be a part of this patient's care.  Alycia Rossetti, PharmD, BCPS Clinical Pharmacist Clinical phone for 11/03/2020: [phone] 11/03/2020 10:48 AM   **Pharmacist phone directory can now be found on [email or web address] ([email or web address]).  Listed under [place].

## 2020-11-03 NOTE — Progress Notes (Signed)
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  Assessment & Plan:   Principal Problem:   Sepsis due to urinary tract infection ([place]) Active Problems:   Anemia   Hyponatremia   Diabetes mellitus, type 2 ([place])  Sepsis  E. 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   Pro-Cal elevated    Urine culture mixed organism  blood culture growing e. Coli, pan sensitive - plan for ancef   Renal ultrasound with moderate L sided hydro - discussed with IR, now s/p nephrostomy exchange with IR Will discuss with ID regarding abx duration with nephrostomy tubes  Anemia  Iron Deficiency:  Labs suggest iron def anemia  Anion Gap Metabolic acidosis: bicarb 19 in 21s-suspect in the setting of CKD.  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  Overall stable.  Hyponatremia: Resolved  Diarhea 8/11- c diff ordered, no recurrence    Diabetes mellitus, type 2:  Blood sugar well controlled  at home on glyburide, metformin at home holding for now, continue sliding scale.  DVT prophylaxis: lovenox Code Status: full  Family Communication: granddaughter at bedside Disposition:   Status is: Inpatient  Remains inpatient appropriate because:Inpatient level of care appropriate due to severity of illness  Dispo: The  patient is from: Home              Anticipated d/c is to: Home              Patient currently is not medically stable to d/c.   Difficult to place patient No       Consultants:  IR ID  Procedures:   8/14 IMPRESSION: Status post routine exchange of bilateral percutaneous nephrostomy. IMPRESSION: Status post routine exchange of bilateral percutaneous nephrostomy.  Antimicrobials:  Anti-infectives (From admission, onward)    Start     Dose/Rate Route Frequency Ordered Stop   11/04/20 0800  ceFAZolin (ANCEF) IVPB 1 g/50 mL premix        1 g 100 mL/hr over 30 Minutes Intravenous Every 12 hours 11/03/20 1036     11/01/20 2000  ceFEPIme (MAXIPIME) 2 g in sodium chloride 0.9 % 100 mL IVPB  Status:  Discontinued        2 g 200 mL/hr over 30 Minutes Intravenous Every 24 hours 10/31/20 2135 11/01/20 0940   11/01/20 1000  cefTRIAXone (ROCEPHIN) 2 g in sodium chloride 0.9 % 100 mL IVPB  Status:  Discontinued        2 g 200 mL/hr over 30 Minutes Intravenous Every 24 hours 11/01/20 0940 11/03/20 1036   10/31/20 2000  aztreonam (AZACTAM) 2 g in sodium chloride 0.9 % 100 mL IVPB  Status:  Discontinued        2 g 200 mL/hr over 30 Minutes Intravenous  Once 10/31/20 1946 10/31/20 1950   10/31/20 2000  ceFEPIme (MAXIPIME) 2 g in sodium chloride 0.9 % 100 mL IVPB  2 g 200 mL/hr over 30 Minutes Intravenous  Once 10/31/20 1950 10/31/20 2051          Subjective: No complaints Declined interpreter, granddaughter helped  Objective: Vitals:   11/03/20 0306 11/03/20 0743 11/03/20 1147 11/03/20 1653  BP: (!) 141/57 (!) 126/52 (!) 128/57 131/62  Pulse: 75 73 74 77  Resp: (!) 24 20 (!) 22 19  Temp: 98.1 F (36.7 C) 98.1 F (36.7 C) 97.9 F (36.6 C) 98.8 F (37.1 C)  TempSrc: Oral Oral Oral Oral  SpO2: 98% 95% 98% 99%  Weight:      Height:        Intake/Output Summary (Last 24 hours) at 11/03/2020 1742 Last data filed at 11/03/2020 1406 Gross per 24 hour  Intake 240 ml   Output 330 ml  Net -90 ml   Filed Weights   10/31/20 2133  Weight: 45.4 kg    Examination:  General exam: Appears calm and comfortable  Respiratory system: Clear to auscultation. Respiratory effort normal. Cardiovascular system: S1 & S2 heard, RRR. Gastrointestinal system: Abdomen is nondistended, soft and nontender.  bilateral perc drains Central nervous system: Alert and oriented. No focal neurological deficits. Extremities: no LEE Skin: No rashes, lesions or ulcers Psychiatry: Judgement and insight appear normal. Mood & affect appropriate.     Data Reviewed: I have personally reviewed following labs and imaging studies  CBC: Recent Labs  Lab 10/31/20 1754 11/01/20 0416 11/02/20 0057 11/03/20 0123  WBC 12.1* 11.7* 7.9 5.7  NEUTROABS 11.5*  --   --   --   HGB 10.0* 8.4* 7.8* 9.0*  HCT 31.0* 25.7* 24.2* 27.5*  MCV 94.8 94.1 92.0 93.2  PLT 202 159 134* 136*    Basic Metabolic Panel: Recent Labs  Lab 10/31/20 1754 11/01/20 0416 11/02/20 0057 11/03/20 0123  NA 129* 134* 136 136  K 4.5 4.1 3.5 3.7  CL 103 106 109 109  CO2 17* 19* 21* 19*  GLUCOSE 331* 231* 83 97  BUN 36* 33* 25* 25*  CREATININE 1.96* 1.79* 1.71* 1.87*  CALCIUM 9.1 8.7* 8.3* 8.4*    GFR: Estimated Creatinine Clearance: 16.9 mL/min (Deysi Soldo) (by C-G formula based on SCr of 1.87 mg/dL (H)).  Liver Function Tests: Recent Labs  Lab 10/31/20 1754 11/01/20 0416  AST 21 17  ALT 12 10  ALKPHOS 107 78  BILITOT 0.4 0.3  PROT 7.5 5.9*  ALBUMIN 3.1* 2.4*    CBG: Recent Labs  Lab 11/02/20 1152 11/02/20 1642 11/03/20 0740 11/03/20 1146 11/03/20 1651  GLUCAP 151* 125* 118* 203* 182*     Recent Results (from the past 240 hour(s))  Blood Culture (routine x 2)     Status: Abnormal   Collection Time: 10/31/20  5:18 PM   Specimen: BLOOD LEFT ARM  Result Value Ref Range Status   Specimen Description BLOOD LEFT ARM  Final   Special Requests   Final    BOTTLES DRAWN AEROBIC AND ANAEROBIC  Blood Culture adequate volume   Culture  Setup Time   Final    GRAM NEGATIVE RODS ANAEROBIC BOTTLE ONLY CRITICAL RESULT CALLED TO, READ BACK BY AND VERIFIED WITH: PHARMD CATHY PIERCE 11/01/20@8 :36 BY TW GRAM VARIABLE ROD AEROBIC BOTTLE ONLY Performed at [facility], [place] [address], [place], [place] [address]    Culture ESCHERICHIA COLI (Gayatri Teasdale)  Final   Report Status 11/03/2020 FINAL  Final   Organism ID, Bacteria ESCHERICHIA COLI  Final      Susceptibility   Escherichia  coli - MIC*    AMPICILLIN <=2 SENSITIVE Sensitive     CEFAZOLIN <=4 SENSITIVE Sensitive     CEFEPIME <=0.12 SENSITIVE Sensitive     CEFTAZIDIME <=1 SENSITIVE Sensitive     CEFTRIAXONE <=0.25 SENSITIVE Sensitive     CIPROFLOXACIN <=0.25 SENSITIVE Sensitive     GENTAMICIN <=1 SENSITIVE Sensitive     IMIPENEM <=0.25 SENSITIVE Sensitive     TRIMETH/SULFA <=20 SENSITIVE Sensitive     AMPICILLIN/SULBACTAM <=2 SENSITIVE Sensitive     PIP/TAZO <=4 SENSITIVE Sensitive     * ESCHERICHIA COLI  Blood Culture ID Panel (Reflexed)     Status: Abnormal   Collection Time: 10/31/20  5:18 PM  Result Value Ref Range Status   Enterococcus faecalis NOT DETECTED NOT DETECTED Final   Enterococcus Faecium NOT DETECTED NOT DETECTED Final   Listeria monocytogenes NOT DETECTED NOT DETECTED Final   Staphylococcus species NOT DETECTED NOT DETECTED Final   Staphylococcus aureus (BCID) NOT DETECTED NOT DETECTED Final   Staphylococcus epidermidis NOT DETECTED NOT DETECTED Final   Staphylococcus lugdunensis NOT DETECTED NOT DETECTED Final   Streptococcus species NOT DETECTED NOT DETECTED Final   Streptococcus agalactiae NOT DETECTED NOT DETECTED Final   Streptococcus pneumoniae NOT DETECTED NOT DETECTED Final   Streptococcus pyogenes NOT DETECTED NOT DETECTED Final   [place] Sites.calcoaceticus-baumannii NOT DETECTED NOT DETECTED Final   Bacteroides fragilis NOT DETECTED NOT DETECTED Final   Enterobacterales DETECTED (Doroteo Nickolson) NOT DETECTED Final     Comment: Enterobacterales represent Satomi Buda large order of gram negative bacteria, not Chiquitta Matty single organism. CRITICAL RESULT CALLED TO, READ BACK BY AND VERIFIED WITH: PHARMD CATHY PIERCE 11/01/20@8 :36 BY TW    Enterobacter cloacae complex NOT DETECTED NOT DETECTED Final   Escherichia coli DETECTED (Rhaya Coale) NOT DETECTED Final    Comment: CRITICAL RESULT CALLED TO, READ BACK BY AND VERIFIED WITH: PHARMD CATHY PIERCE 11/01/20@8 :36 BY TW    Klebsiella aerogenes NOT DETECTED NOT DETECTED Final   Klebsiella oxytoca NOT DETECTED NOT DETECTED Final   Klebsiella pneumoniae NOT DETECTED NOT DETECTED Final   Proteus species NOT DETECTED NOT DETECTED Final   Salmonella species NOT DETECTED NOT DETECTED Final   Serratia marcescens NOT DETECTED NOT DETECTED Final   Haemophilus influenzae NOT DETECTED NOT DETECTED Final   Neisseria meningitidis NOT DETECTED NOT DETECTED Final   Pseudomonas aeruginosa NOT DETECTED NOT DETECTED Final   Stenotrophomonas maltophilia NOT DETECTED NOT DETECTED Final   Candida albicans NOT DETECTED NOT DETECTED Final   Candida auris NOT DETECTED NOT DETECTED Final   Candida glabrata NOT DETECTED NOT DETECTED Final   Candida krusei NOT DETECTED NOT DETECTED Final   Candida parapsilosis NOT DETECTED NOT DETECTED Final   Candida tropicalis NOT DETECTED NOT DETECTED Final   Cryptococcus neoformans/gattii NOT DETECTED NOT DETECTED Final   CTX-M ESBL NOT DETECTED NOT DETECTED Final   Carbapenem resistance IMP NOT DETECTED NOT DETECTED Final   Carbapenem resistance KPC NOT DETECTED NOT DETECTED Final   Carbapenem resistance NDM NOT DETECTED NOT DETECTED Final   Carbapenem resist OXA 48 LIKE NOT DETECTED NOT DETECTED Final   Carbapenem resistance VIM NOT DETECTED NOT DETECTED Final    Comment: Performed at [facility], [address], [place], [place] [address]  Resp Panel by RT-PCR (Flu Jakory Matsuo&B, Covid) Nasopharyngeal Swab     Status: None   Collection Time: 10/31/20  5:22 PM   Specimen:  Nasopharyngeal Swab; Nasopharyngeal(NP) swabs in vial transport medium  Result Value Ref Range Status   SARS Coronavirus  2 by RT PCR NEGATIVE NEGATIVE Final    Comment: (NOTE) SARS-CoV-2 target nucleic acids are NOT DETECTED.  The SARS-CoV-2 RNA is generally detectable in upper respiratory specimens during the acute phase of infection. The lowest concentration of SARS-CoV-2 viral copies this assay can detect is 138 copies/mL. Travis Purk negative result does not preclude SARS-Cov-2 infection and should not be used as the sole basis for treatment or other patient management decisions. Donovin Kraemer negative result may occur with  improper specimen collection/handling, submission of specimen other than nasopharyngeal swab, presence of viral mutation(s) within the areas targeted by this assay, and inadequate number of viral copies(<138 copies/mL). Meryle Pugmire negative result must be combined with clinical observations, patient history, and epidemiological information. The expected result is Negative.  Fact Sheet for Patients:  EntrepreneurPulse.com.au  Fact Sheet for Healthcare Providers:  IncredibleEmployment.be  This test is no t yet approved or cleared by the [place] FDA and  has been authorized for detection and/or diagnosis of SARS-CoV-2 by FDA under an Emergency Use Authorization (EUA). This EUA will remain  in effect (meaning this test can be used) for the duration of the COVID-19 declaration under Section 564(b)(1) of the Act, 21 U.S.C.section 360bbb-3(b)(1), unless the authorization is terminated  or revoked sooner.       Influenza Andrae Claunch by PCR NEGATIVE NEGATIVE Final   Influenza B by PCR NEGATIVE NEGATIVE Final    Comment: (NOTE) The Xpert Xpress SARS-CoV-2/FLU/RSV plus assay is intended as an aid in the diagnosis of influenza from Nasopharyngeal swab specimens and should not be used as Nahal Wanless sole basis for treatment. Nasal washings and aspirates are unacceptable for  Xpert Xpress SARS-CoV-2/FLU/RSV testing.  Fact Sheet for Patients: EntrepreneurPulse.com.au  Fact Sheet for Healthcare Providers: IncredibleEmployment.be  This test is not yet approved or cleared by the [place] FDA and has been authorized for detection and/or diagnosis of SARS-CoV-2 by FDA under an Emergency Use Authorization (EUA). This EUA will remain in effect (meaning this test can be used) for the duration of the COVID-19 declaration under Section 564(b)(1) of the Act, 21 U.S.C. section 360bbb-3(b)(1), unless the authorization is terminated or revoked.  Performed at [facility], [place] [address], [place], [place] [address]   Blood Culture (routine x 2)     Status: None (Preliminary result)   Collection Time: 10/31/20  7:30 PM   Specimen: BLOOD  Result Value Ref Range Status   Specimen Description BLOOD LEFT FOREARM  Final   Special Requests   Final    BOTTLES DRAWN AEROBIC AND ANAEROBIC Blood Culture adequate volume   Culture  Setup Time   Final    GRAM NEGATIVE RODS AEROBIC BOTTLE ONLY CRITICAL VALUE NOTED.  VALUE IS CONSISTENT WITH PREVIOUSLY REPORTED AND CALLED VALUE.    Culture   Final    GRAM NEGATIVE RODS IDENTIFICATION TO FOLLOW Performed at [facility], [place] [address] [place], [place] [address]    Report Status PENDING  Incomplete  Urine Culture     Status: Abnormal   Collection Time: 10/31/20 11:27 PM   Specimen: In/Out Cath Urine  Result Value Ref Range Status   Specimen Description IN/OUT CATH URINE  Final   Special Requests   Final    NONE Performed at [facility], [place] [address], [place], [place] [address]    Culture MULTIPLE SPECIES PRESENT, SUGGEST RECOLLECTION (Syre Knerr)  Final   Report Status 11/02/2020 FINAL  Final  C Difficile Quick Screen w PCR reflex  Status: None   Collection Time: 11/02/20 11:00 AM   Specimen: STOOL  Result Value Ref Range Status   C Diff antigen NEGATIVE NEGATIVE  Final   C Diff toxin NEGATIVE NEGATIVE Final   C Diff interpretation No C. difficile detected.  Final    Comment: Performed at [facility], [place] [address], [place], [place] [address]         Radiology Studies: US RENAL  Result Date: 11/02/2020 CLINICAL DATA:  UTI EXAM: RENAL / URINARY TRACT ULTRASOUND COMPLETE COMPARISON:  None. FINDINGS: Right Kidney: Renal measurements: 10.1 x 4.1 x 5.3 cm. = volume: 115 mL. Mild cortical thinning is noted. Prominent extrarenal pelvis is noted. Left Kidney: Renal measurements: 9.9 x 4.6 x 5.6 cm. = volume: 133 mL. Moderate left-sided hydronephrosis is noted. Bladder: Bladder is partially distended. Other: None. IMPRESSION: Moderate left-sided hydronephrosis. It should be noted patient has known bilateral nephrostomy catheters although the catheters themselves are not definitively seen in the renal pelves bilaterally. Given the hydronephrosis, interventional Radiology consultation may be helpful. Electronically Signed   By: Inez Catalina M.D.   On: 11/02/2020 18:07   DG Chest Port 1 View  Result Date: 11/02/2020 CLINICAL DATA:  Fevers EXAM: PORTABLE CHEST 1 VIEW COMPARISON:  10/31/2020 FINDINGS: Cardiac shadow is stable. Aortic calcifications are seen. Right basilar atelectasis is noted new from the prior exam. The lungs are otherwise clear. No bony abnormality is noted. IMPRESSION: Mild right basilar atelectasis. Electronically Signed   By: Inez Catalina M.D.   On: 11/02/2020 19:05   IR NEPHROSTOMY EXCHANGE LEFT  Result Date: 11/03/2020 INDICATION: 80 year old female with history bilateral percutaneous nephrostomy, admitted for sepsis and referred for exchange EXAM: IR EXCHANGE NEPHROSTOMY LEFT; IR EXCHANGE NEPHROSTOMY RIGHT COMPARISON:  09/24/2020 MEDICATIONS: None ANESTHESIA/SEDATION: None CONTRAST:  69m OMNIPAQUE IOHEXOL 300 MG/ML SOLN - administered into the collecting system(s) FLUOROSCOPY TIME:  Fluoroscopy Time: 0 minutes 48 seconds (1 mGy).  COMPLICATIONS: None PROCEDURE: Informed written consent was obtained from the patient after Kadijah Shamoon thorough discussion of the procedural risks, benefits and alternatives. All questions were addressed. Maximal Sterile Barrier Technique was utilized including caps, mask, sterile gowns, sterile gloves, sterile drape, hand hygiene and skin antiseptic. Rojean Ige timeout was performed prior to the initiation of the procedure. Left: 1% lidocaine was used for local anesthesia. Small amount of contrast was infused confirming location in the collecting system. Modified Seldinger technique was then used to exchange for Ashmi Blas new 12 French percutaneous nephrostomy. Catheter was formed in the collecting system and contrast confirmed location. Catheter was sutured in location and attached to gravity drainage. Right: 1% lidocaine was used for local anesthesia. Small amount of contrast was infused confirming location in the collecting system. Modified Seldinger technique was then used to exchange for Merial Moritz new 12 French percutaneous nephrostomy. Catheter was formed in the collecting system and contrast confirmed location. Catheter was sutured in location and attached to gravity drainage. Patient tolerated the procedure well and remained hemodynamically stable throughout. No complications were encountered and no significant blood loss. IMPRESSION: Status post routine exchange of bilateral percutaneous nephrostomy. Signed, JDulcy Fanny WDellia Nims RPVI [facility] Hospital[facility] Electronically Signed   By: JCorrie MckusickD.O.   On: 11/03/2020 13:25   IR NEPHROSTOMY EXCHANGE RIGHT  Result Date: 11/03/2020 INDICATION: 80year old female with history bilateral percutaneous nephrostomy, admitted for sepsis and referred for exchange EXAM: IR EXCHANGE NEPHROSTOMY LEFT; IR EXCHANGE NEPHROSTOMY RIGHT COMPARISON:  09/24/2020 MEDICATIONS: None ANESTHESIA/SEDATION: None CONTRAST:  55m OMNIPAQUE IOHEXOL 300 MG/ML SOLN -  administered into the collecting system(s) FLUOROSCOPY TIME:  Fluoroscopy Time: 0 minutes 48 seconds (1 mGy). COMPLICATIONS: None PROCEDURE: Informed written consent was obtained from the patient after Tylique Aull thorough discussion of the procedural risks, benefits and alternatives. All questions were addressed. Maximal Sterile Barrier Technique was utilized including caps, mask, sterile gowns, sterile gloves, sterile drape, hand hygiene and skin antiseptic. Chelsee Hosie timeout was performed prior to the initiation of the procedure. Left: 1% lidocaine was used for local anesthesia. Small amount of contrast was infused confirming location in the collecting system. Modified Seldinger technique was then used to exchange for Idus Rathke new 12 French percutaneous nephrostomy. Catheter was formed in the collecting system and contrast confirmed location. Catheter was sutured in location and attached to gravity drainage. Right: 1% lidocaine was used for local anesthesia. Small amount of contrast was infused confirming location in the collecting system. Modified Seldinger technique was then used to exchange for Edwyna Dangerfield new 12 French percutaneous nephrostomy. Catheter was formed in the collecting system and contrast confirmed location. Catheter was sutured in location and attached to gravity drainage. Patient tolerated the procedure well and remained hemodynamically stable throughout. No complications were encountered and no significant blood loss. IMPRESSION: Status post routine exchange of bilateral percutaneous nephrostomy. Signed, JDulcy Fanny WDellia Nims RPVI [facility] Electronically Signed   By: JCorrie MckusickD.O.   On: 11/03/2020 13:25        Scheduled Meds:  enoxaparin (LOVENOX) injection  30 mg Subcutaneous Q24H   insulin aspart  0-9 Units Subcutaneous TID WC   sodium chloride flush  3 mL Intravenous Q12H   Continuous Infusions:  [START ON 11/04/2020]  ceFAZolin (ANCEF) IV        LOS: 2 days    Time spent: over 30 min    CFayrene Helper MD [facility]   To contact the attending provider between 7A-7P or the covering provider during after hours 7P-7A, please log into the web site [email or web address] and access using universal [place] password for that web site. If you do not have the password, please call the hospital operator.  11/03/2020, 5:42 PM

## 2020-11-04 DIAGNOSIS — N390 Urinary tract infection, site not specified: Secondary | ICD-10-CM

## 2020-11-04 DIAGNOSIS — A419 Sepsis, unspecified organism: Secondary | ICD-10-CM

## 2020-11-04 LAB — CBC WITH DIFFERENTIAL/PLATELET
Abs Immature Granulocytes: 0.02 10*3/uL (ref 0.00–0.07)
Basophils Absolute: 0 10*3/uL (ref 0.0–0.1)
Basophils Relative: 1 %
Eosinophils Absolute: 0.1 10*3/uL (ref 0.0–0.5)
Eosinophils Relative: 3 %
HCT: 25.3 % — ABNORMAL LOW (ref 36.0–46.0)
Hemoglobin: 8.1 g/dL — ABNORMAL LOW (ref 12.0–15.0)
Immature Granulocytes: 1 %
Lymphocytes Relative: 24 %
Lymphs Abs: 1 10*3/uL (ref 0.7–4.0)
MCH: 29.5 pg (ref 26.0–34.0)
MCHC: 32 g/dL (ref 30.0–36.0)
MCV: 92 fL (ref 80.0–100.0)
Monocytes Absolute: 0.3 10*3/uL (ref 0.1–1.0)
Monocytes Relative: 8 %
Neutro Abs: 2.5 10*3/uL (ref 1.7–7.7)
Neutrophils Relative %: 63 %
Platelets: 135 10*3/uL — ABNORMAL LOW (ref 150–400)
RBC: 2.75 MIL/uL — ABNORMAL LOW (ref 3.87–5.11)
RDW: 13.9 % (ref 11.5–15.5)
WBC: 3.9 10*3/uL — ABNORMAL LOW (ref 4.0–10.5)
nRBC: 0 % (ref 0.0–0.2)

## 2020-11-04 LAB — MAGNESIUM: Magnesium: 1.7 mg/dL (ref 1.7–2.4)

## 2020-11-04 LAB — CULTURE, BLOOD (ROUTINE X 2): Special Requests: ADEQUATE

## 2020-11-04 LAB — COMPREHENSIVE METABOLIC PANEL
ALT: 11 U/L (ref 0–44)
AST: 16 U/L (ref 15–41)
Albumin: 2.2 g/dL — ABNORMAL LOW (ref 3.5–5.0)
Alkaline Phosphatase: 112 U/L (ref 38–126)
Anion gap: 7 (ref 5–15)
BUN: 19 mg/dL (ref 8–23)
CO2: 20 mmol/L — ABNORMAL LOW (ref 22–32)
Calcium: 8.2 mg/dL — ABNORMAL LOW (ref 8.9–10.3)
Chloride: 109 mmol/L (ref 98–111)
Creatinine, Ser: 1.73 mg/dL — ABNORMAL HIGH (ref 0.44–1.00)
GFR, Estimated: 29 mL/min — ABNORMAL LOW (ref >60)
Glucose, Bld: 126 mg/dL — ABNORMAL HIGH (ref 70–99)
Potassium: 3.5 mmol/L (ref 3.5–5.1)
Sodium: 136 mmol/L (ref 135–145)
Total Bilirubin: 0.3 mg/dL (ref 0.3–1.2)
Total Protein: 5.9 g/dL — ABNORMAL LOW (ref 6.5–8.1)

## 2020-11-04 LAB — GLUCOSE, CAPILLARY
Glucose-Capillary: 137 mg/dL — ABNORMAL HIGH (ref 70–99)
Glucose-Capillary: 180 mg/dL — ABNORMAL HIGH (ref 70–99)
Glucose-Capillary: 108 mg/dL — ABNORMAL HIGH (ref 70–99)
Glucose-Capillary: 182 mg/dL — ABNORMAL HIGH (ref 70–99)

## 2020-11-04 LAB — PHOSPHORUS: Phosphorus: 2.6 mg/dL (ref 2.5–4.6)

## 2020-11-04 MED ORDER — CEPHALEXIN 500 MG PO CAPS
500.0000 mg | ORAL_CAPSULE | Freq: Two times a day (BID) | ORAL | Status: DC
  Administered 2020-11-04 – 2020-11-05 (×2): 500 mg via ORAL
  Filled 2020-11-04 (×2): qty 1

## 2020-11-04 NOTE — Progress Notes (Signed)
PT eval complete, documentation pending. Will continue to follow in-house, but do not anticipate PT f/u or DME needs at DC.  Windell Norfolk, DPT, PN2   Supplemental Physical Therapist [place]    Pager [phone] Acute Rehab Office [phone]

## 2020-11-04 NOTE — Progress Notes (Signed)
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  Assessment & Plan:   Principal Problem:   Sepsis due to urinary tract infection ([place]) Active Problems:   Anemia   Hyponatremia   Diabetes mellitus, type 2 ([place])  Sepsis  E. 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   Pro-Cal elevated    Urine culture mixed organism  blood culture growing e. Coli, pan sensitive - plan for ancef   Renal ultrasound with moderate L sided hydro - discussed with IR, now s/p nephrostomy exchange with IR ID recommending amoxicillin 500 mg TID to complete course  Anemia  Iron Deficiency:  Labs suggest iron def anemia  Anion Gap Metabolic acidosis: bicarb 19 in 21s-suspect in the setting of CKD.  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  Overall stable.  Hyponatremia: Resolved  Diarhea 8/11- c diff ordered, no recurrence    Diabetes mellitus, type 2:  Blood sugar well controlled  at home on glyburide, metformin at home holding for now, continue sliding scale.  DVT prophylaxis: lovenox Code Status: full  Family Communication: granddaughter at bedside Disposition:   Status is: Inpatient  Remains inpatient appropriate because:Inpatient level of care appropriate due to severity of illness  Dispo: The patient is  from: Home              Anticipated d/c is to: Home              Patient currently is not medically stable to d/c.   Difficult to place patient No       Consultants:  IR ID  Procedures:   8/14 IMPRESSION: Status post routine exchange of bilateral percutaneous nephrostomy. IMPRESSION: Status post routine exchange of bilateral percutaneous nephrostomy.  Antimicrobials:  Anti-infectives (From admission, onward)    Start     Dose/Rate Route Frequency Ordered Stop   11/04/20 2200  cephALEXin (KEFLEX) capsule 500 mg        500 mg Oral Every 12 hours 11/04/20 1157     11/04/20 0800  ceFAZolin (ANCEF) IVPB 1 g/50 mL premix  Status:  Discontinued        1 g 100 mL/hr over 30 Minutes Intravenous Every 12 hours 11/03/20 1036 11/04/20 1157   11/01/20 2000  ceFEPIme (MAXIPIME) 2 g in sodium chloride 0.9 % 100 mL IVPB  Status:  Discontinued        2 g 200 mL/hr over 30 Minutes Intravenous Every 24 hours 10/31/20 2135 11/01/20 0940   11/01/20 1000  cefTRIAXone (ROCEPHIN) 2 g in sodium chloride 0.9 % 100 mL IVPB  Status:  Discontinued        2 g 200 mL/hr over 30 Minutes Intravenous Every 24 hours 11/01/20 0940 11/03/20 1036   10/31/20 2000  aztreonam (AZACTAM) 2 g in sodium chloride 0.9 % 100 mL IVPB  Status:  Discontinued        2  g 200 mL/hr over 30 Minutes Intravenous  Once 10/31/20 1946 10/31/20 1950   10/31/20 2000  ceFEPIme (MAXIPIME) 2 g in sodium chloride 0.9 % 100 mL IVPB        2 g 200 mL/hr over 30 Minutes Intravenous  Once 10/31/20 1950 10/31/20 2051          Subjective: No complaints  Objective: Vitals:   11/04/20 0351 11/04/20 0759 11/04/20 1252 11/04/20 1726  BP: 123/64 133/61 (!) 123/57 (!) 147/62  Pulse: 71 70 69 68  Resp: 16 18 18 18   Temp: 97.8 F (36.6 C) 97.9 F (36.6 C) 98.1 F (36.7 C) 98.5 F (36.9 C)  TempSrc: Oral Oral Oral Oral  SpO2: 97% 99% 98% 100%  Weight:      Height:        Intake/Output Summary (Last 24 hours) at 11/04/2020  1859 Last data filed at 11/04/2020 1500 Gross per 24 hour  Intake 50 ml  Output 600 ml  Net -550 ml   Filed Weights   10/31/20 2133  Weight: 45.4 kg    Examination:  General: No acute distress. Cardiovascular:RRR Lungs: unalbored Abdomen: Soft, nontender, nondistended Bilateral nephrostomy tubes Neurological: Alert and oriented 3. Moves all extremities 4 with equal strength. Cranial nerves II through XII grossly intact. Skin: Warm and dry. No rashes or lesions. Extremities: No clubbing or cyanosis. No edema.   Data Reviewed: I have personally reviewed following labs and imaging studies  CBC: Recent Labs  Lab 10/31/20 1754 11/01/20 0416 11/02/20 0057 11/03/20 0123 11/04/20 0046  WBC 12.1* 11.7* 7.9 5.7 3.9*  NEUTROABS 11.5*  --   --   --  2.5  HGB 10.0* 8.4* 7.8* 9.0* 8.1*  HCT 31.0* 25.7* 24.2* 27.5* 25.3*  MCV 94.8 94.1 92.0 93.2 92.0  PLT 202 159 134* 136* 135*    Basic Metabolic Panel: Recent Labs  Lab 10/31/20 1754 11/01/20 0416 11/02/20 0057 11/03/20 0123 11/04/20 0046  NA 129* 134* 136 136 136  K 4.5 4.1 3.5 3.7 3.5  CL 103 106 109 109 109  CO2 17* 19* 21* 19* 20*  GLUCOSE 331* 231* 83 97 126*  BUN 36* 33* 25* 25* 19  CREATININE 1.96* 1.79* 1.71* 1.87* 1.73*  CALCIUM 9.1 8.7* 8.3* 8.4* 8.2*  MG  --   --   --   --  1.7  PHOS  --   --   --   --  2.6    GFR: Estimated Creatinine Clearance: 18.3 mL/min (Wendie Diskin) (by C-G formula based on SCr of 1.73 mg/dL (H)).  Liver Function Tests: Recent Labs  Lab 10/31/20 1754 11/01/20 0416 11/04/20 0046  AST 21 17 16   ALT 12 10 11   ALKPHOS 107 78 112  BILITOT 0.4 0.3 0.3  PROT 7.5 5.9* 5.9*  ALBUMIN 3.1* 2.4* 2.2*    CBG: Recent Labs  Lab 11/03/20 1651 11/03/20 2042 11/04/20 0807 11/04/20 1257 11/04/20 1733  GLUCAP 182* 146* 137* 180* 108*     Recent Results (from the past 240 hour(s))  Blood Culture (routine x 2)     Status: Abnormal   Collection Time: 10/31/20  5:18 PM   Specimen: BLOOD  LEFT ARM  Result Value Ref Range Status   Specimen Description BLOOD LEFT ARM  Final   Special Requests   Final    BOTTLES DRAWN AEROBIC AND ANAEROBIC Blood Culture adequate volume   Culture  Setup Time   Final    GRAM NEGATIVE RODS ANAEROBIC BOTTLE ONLY CRITICAL  RESULT CALLED TO, READ BACK BY AND VERIFIED WITH: PHARMD CATHY PIERCE 11/01/20@8 :36 BY TW GRAM VARIABLE ROD AEROBIC BOTTLE ONLY Performed at [facility], [place] [address], [place], [place] [address]    Culture ESCHERICHIA COLI (Sahib Pella)  Final   Report Status 11/03/2020 FINAL  Final   Organism ID, Bacteria ESCHERICHIA COLI  Final      Susceptibility   Escherichia coli - MIC*    AMPICILLIN <=2 SENSITIVE Sensitive     CEFAZOLIN <=4 SENSITIVE Sensitive     CEFEPIME <=0.12 SENSITIVE Sensitive     CEFTAZIDIME <=1 SENSITIVE Sensitive     CEFTRIAXONE <=0.25 SENSITIVE Sensitive     CIPROFLOXACIN <=0.25 SENSITIVE Sensitive     GENTAMICIN <=1 SENSITIVE Sensitive     IMIPENEM <=0.25 SENSITIVE Sensitive     TRIMETH/SULFA <=20 SENSITIVE Sensitive     AMPICILLIN/SULBACTAM <=2 SENSITIVE Sensitive     PIP/TAZO <=4 SENSITIVE Sensitive     * ESCHERICHIA COLI  Blood Culture ID Panel (Reflexed)     Status: Abnormal   Collection Time: 10/31/20  5:18 PM  Result Value Ref Range Status   Enterococcus faecalis NOT DETECTED NOT DETECTED Final   Enterococcus Faecium NOT DETECTED NOT DETECTED Final   Listeria monocytogenes NOT DETECTED NOT DETECTED Final   Staphylococcus species NOT DETECTED NOT DETECTED Final   Staphylococcus aureus (BCID) NOT DETECTED NOT DETECTED Final   Staphylococcus epidermidis NOT DETECTED NOT DETECTED Final   Staphylococcus lugdunensis NOT DETECTED NOT DETECTED Final   Streptococcus species NOT DETECTED NOT DETECTED Final   Streptococcus agalactiae NOT DETECTED NOT DETECTED Final   Streptococcus pneumoniae NOT DETECTED NOT DETECTED Final   Streptococcus pyogenes NOT DETECTED NOT DETECTED Final    Alby Schwabe.calcoaceticus-baumannii NOT DETECTED NOT DETECTED Final   Bacteroides fragilis NOT DETECTED NOT DETECTED Final   Enterobacterales DETECTED (Nathanal Hermiz) NOT DETECTED Final    Comment: Enterobacterales represent Rodrigues Urbanek large order of gram negative bacteria, not Alivya Wegman single organism. CRITICAL RESULT CALLED TO, READ BACK BY AND VERIFIED WITH: PHARMD CATHY PIERCE 11/01/20@8 :36 BY TW    Enterobacter cloacae complex NOT DETECTED NOT DETECTED Final   Escherichia coli DETECTED (Colvin Blatt) NOT DETECTED Final    Comment: CRITICAL RESULT CALLED TO, READ BACK BY AND VERIFIED WITH: PHARMD CATHY PIERCE 11/01/20@8 :36 BY TW    Klebsiella aerogenes NOT DETECTED NOT DETECTED Final   Klebsiella oxytoca NOT DETECTED NOT DETECTED Final   Klebsiella pneumoniae NOT DETECTED NOT DETECTED Final   Proteus species NOT DETECTED NOT DETECTED Final   Salmonella species NOT DETECTED NOT DETECTED Final   Serratia marcescens NOT DETECTED NOT DETECTED Final   Haemophilus influenzae NOT DETECTED NOT DETECTED Final   Neisseria meningitidis NOT DETECTED NOT DETECTED Final   Pseudomonas aeruginosa NOT DETECTED NOT DETECTED Final   Stenotrophomonas maltophilia NOT DETECTED NOT DETECTED Final   Candida albicans NOT DETECTED NOT DETECTED Final   Candida auris NOT DETECTED NOT DETECTED Final   Candida glabrata NOT DETECTED NOT DETECTED Final   Candida krusei NOT DETECTED NOT DETECTED Final   Candida parapsilosis NOT DETECTED NOT DETECTED Final   Candida tropicalis NOT DETECTED NOT DETECTED Final   Cryptococcus neoformans/gattii NOT DETECTED NOT DETECTED Final   CTX-M ESBL NOT DETECTED NOT DETECTED Final   Carbapenem resistance IMP NOT DETECTED NOT DETECTED Final   Carbapenem resistance KPC NOT DETECTED NOT DETECTED Final   Carbapenem resistance NDM NOT DETECTED NOT DETECTED Final   Carbapenem resist OXA 48 LIKE NOT DETECTED NOT DETECTED Final   Carbapenem resistance  VIM NOT DETECTED NOT DETECTED Final    Comment: Performed at [facility], [place] [address] [place], [place] [address]  Resp Panel by RT-PCR (Flu Sanai Frick&B, Covid) Nasopharyngeal Swab     Status: None   Collection Time: 10/31/20  5:22 PM   Specimen: Nasopharyngeal Swab; Nasopharyngeal(NP) swabs in vial transport medium  Result Value Ref Range Status   SARS Coronavirus 2 by RT PCR NEGATIVE NEGATIVE Final    Comment: (NOTE) SARS-CoV-2 target nucleic acids are NOT DETECTED.  The SARS-CoV-2 RNA is generally detectable in upper respiratory specimens during the acute phase of infection. The lowest concentration of SARS-CoV-2 viral copies this assay can detect is 138 copies/mL. Strummer Canipe negative result does not preclude SARS-Cov-2 infection and should not be used as the sole basis for treatment or other patient management decisions. Khamora Karan negative result may occur with  improper specimen collection/handling, submission of specimen other than nasopharyngeal swab, presence of viral mutation(s) within the areas targeted by this assay, and inadequate number of viral copies(<138 copies/mL). Mikell Kazlauskas negative result must be combined with clinical observations, patient history, and epidemiological information. The expected result is Negative.  Fact Sheet for Patients:  EntrepreneurPulse.com.au  Fact Sheet for Healthcare Providers:  IncredibleEmployment.be  This test is no t yet approved or cleared by the [place] FDA and  has been authorized for detection and/or diagnosis of SARS-CoV-2 by FDA under an Emergency Use Authorization (EUA). This EUA will remain  in effect (meaning this test can be used) for the duration of the COVID-19 declaration under Section 564(b)(1) of the Act, 21 U.S.C.section 360bbb-3(b)(1), unless the authorization is terminated  or revoked sooner.       Influenza Suzzane Quilter by PCR NEGATIVE NEGATIVE Final   Influenza B by PCR NEGATIVE NEGATIVE Final    Comment: (NOTE) The Xpert Xpress SARS-CoV-2/FLU/RSV plus assay is intended as an  aid in the diagnosis of influenza from Nasopharyngeal swab specimens and should not be used as Dorothy Landgrebe sole basis for treatment. Nasal washings and aspirates are unacceptable for Xpert Xpress SARS-CoV-2/FLU/RSV testing.  Fact Sheet for Patients: EntrepreneurPulse.com.au  Fact Sheet for Healthcare Providers: IncredibleEmployment.be  This test is not yet approved or cleared by the [place] FDA and has been authorized for detection and/or diagnosis of SARS-CoV-2 by FDA under an Emergency Use Authorization (EUA). This EUA will remain in effect (meaning this test can be used) for the duration of the COVID-19 declaration under Section 564(b)(1) of the Act, 21 U.S.C. section 360bbb-3(b)(1), unless the authorization is terminated or revoked.  Performed at [facility], [place] [address] [place], [place] [address]   Blood Culture (routine x 2)     Status: Abnormal   Collection Time: 10/31/20  7:30 PM   Specimen: BLOOD  Result Value Ref Range Status   Specimen Description BLOOD LEFT FOREARM  Final   Special Requests   Final    BOTTLES DRAWN AEROBIC AND ANAEROBIC Blood Culture adequate volume   Culture  Setup Time   Final    GRAM NEGATIVE RODS AEROBIC BOTTLE ONLY CRITICAL VALUE NOTED.  VALUE IS CONSISTENT WITH PREVIOUSLY REPORTED AND CALLED VALUE.    Culture (Ishan Sanroman)  Final    ESCHERICHIA COLI SUSCEPTIBILITIES PERFORMED ON PREVIOUS CULTURE WITHIN THE LAST 5 DAYS. Performed at [facility], [place] [address], [place],  [address]    Report Status 11/04/2020 FINAL  Final  Urine Culture     Status: Abnormal   Collection Time: 10/31/20 11:27 PM  Specimen: In/Out Cath Urine  Result Value Ref Range Status   Specimen Description IN/OUT CATH URINE  Final   Special Requests   Final    NONE Performed at [facility], [address] [place], [place] [address]    Culture MULTIPLE SPECIES PRESENT, SUGGEST RECOLLECTION (Lien Lyman)  Final   Report  Status 11/02/2020 FINAL  Final  C Difficile Quick Screen w PCR reflex     Status: None   Collection Time: 11/02/20 11:00 AM   Specimen: STOOL  Result Value Ref Range Status   C Diff antigen NEGATIVE NEGATIVE Final   C Diff toxin NEGATIVE NEGATIVE Final   C Diff interpretation No C. difficile detected.  Final    Comment: Performed at [facility], [place] [address], [place], [place] [address]  Culture, blood (routine x 2)     Status: None (Preliminary result)   Collection Time: 11/03/20 10:00 AM   Specimen: BLOOD  Result Value Ref Range Status   Specimen Description BLOOD LEFT ANTECUBITAL  Final   Special Requests   Final    BOTTLES DRAWN AEROBIC ONLY Blood Culture adequate volume   Culture   Final    NO GROWTH < 24 HOURS Performed at [facility], [place] [address], [place], [place] [address]    Report Status PENDING  Incomplete  Culture, blood (routine x 2)     Status: None (Preliminary result)   Collection Time: 11/03/20 10:15 AM   Specimen: BLOOD  Result Value Ref Range Status   Specimen Description BLOOD RIGHT ANTECUBITAL  Final   Special Requests   Final    BOTTLES DRAWN AEROBIC ONLY Blood Culture adequate volume   Culture   Final    NO GROWTH < 24 HOURS Performed at [facility], [place] [address] [place], [place] [address]    Report Status PENDING  Incomplete         Radiology Studies: IR NEPHROSTOMY EXCHANGE LEFT  Result Date: 11/03/2020 INDICATION: 80 year old female with history bilateral percutaneous nephrostomy, admitted for sepsis and referred for exchange EXAM: IR EXCHANGE NEPHROSTOMY LEFT; IR EXCHANGE NEPHROSTOMY RIGHT COMPARISON:  09/24/2020 MEDICATIONS: None ANESTHESIA/SEDATION: None CONTRAST:  5m OMNIPAQUE IOHEXOL 300 MG/ML SOLN - administered into the collecting system(s) FLUOROSCOPY TIME:  Fluoroscopy Time: 0 minutes 48 seconds (1 mGy). COMPLICATIONS: None PROCEDURE: Informed written consent was obtained from the patient after Loys Shugars thorough  discussion of the procedural risks, benefits and alternatives. All questions were addressed. Maximal Sterile Barrier Technique was utilized including caps, mask, sterile gowns, sterile gloves, sterile drape, hand hygiene and skin antiseptic. Levie Wages timeout was performed prior to the initiation of the procedure. Left: 1% lidocaine was used for local anesthesia. Small amount of contrast was infused confirming location in the collecting system. Modified Seldinger technique was then used to exchange for Brandis Wixted new 12 French percutaneous nephrostomy. Catheter was formed in the collecting system and contrast confirmed location. Catheter was sutured in location and attached to gravity drainage. Right: 1% lidocaine was used for local anesthesia. Small amount of contrast was infused confirming location in the collecting system. Modified Seldinger technique was then used to exchange for Berry Godsey new 12 French percutaneous nephrostomy. Catheter was formed in the collecting system and contrast confirmed location. Catheter was sutured in location and attached to gravity drainage. Patient tolerated the procedure well and remained hemodynamically stable throughout. No complications were encountered and no significant blood loss. IMPRESSION: Status post routine exchange of bilateral percutaneous nephrostomy. Signed, JDulcy Fanny  Dellia Nims, RPVI [facility] Radiology Electronically Signed   By: Corrie Mckusick D.O.   On: 11/03/2020 13:25   IR NEPHROSTOMY EXCHANGE RIGHT  Result Date: 11/03/2020 INDICATION: 80 year old female with history bilateral percutaneous nephrostomy, admitted for sepsis and referred for exchange EXAM: IR EXCHANGE NEPHROSTOMY LEFT; IR EXCHANGE NEPHROSTOMY RIGHT COMPARISON:  09/24/2020 MEDICATIONS: None ANESTHESIA/SEDATION: None CONTRAST:  72m OMNIPAQUE IOHEXOL 300 MG/ML SOLN - administered into the collecting system(s) FLUOROSCOPY TIME:  Fluoroscopy Time: 0 minutes 48 seconds (1  mGy). COMPLICATIONS: None PROCEDURE: Informed written consent was obtained from the patient after Ayra Hodgdon thorough discussion of the procedural risks, benefits and alternatives. All questions were addressed. Maximal Sterile Barrier Technique was utilized including caps, mask, sterile gowns, sterile gloves, sterile drape, hand hygiene and skin antiseptic. Amato Sevillano timeout was performed prior to the initiation of the procedure. Left: 1% lidocaine was used for local anesthesia. Small amount of contrast was infused confirming location in the collecting system. Modified Seldinger technique was then used to exchange for Robertta Halfhill new 12 French percutaneous nephrostomy. Catheter was formed in the collecting system and contrast confirmed location. Catheter was sutured in location and attached to gravity drainage. Right: 1% lidocaine was used for local anesthesia. Small amount of contrast was infused confirming location in the collecting system. Modified Seldinger technique was then used to exchange for Shahidah Nesbitt new 12 French percutaneous nephrostomy. Catheter was formed in the collecting system and contrast confirmed location. Catheter was sutured in location and attached to gravity drainage. Patient tolerated the procedure well and remained hemodynamically stable throughout. No complications were encountered and no significant blood loss. IMPRESSION: Status post routine exchange of bilateral percutaneous nephrostomy. Signed, JDulcy Fanny WDellia Nims RPVI [facility] Electronically Signed   By: JCorrie MckusickD.O.   On: 11/03/2020 13:25        Scheduled Meds:  cephALEXin  500 mg Oral Q12H   enoxaparin (LOVENOX) injection  30 mg Subcutaneous Q24H   insulin aspart  0-9 Units Subcutaneous TID WC   sodium chloride flush  3 mL Intravenous Q12H   Continuous Infusions:     LOS: 3 days    Time spent: over 30 min    CFayrene Helper MD [facility]   To contact the attending  provider between 7A-7P or the covering provider during after hours 7P-7A, please log into the web site [email or web address] and access using universal [place] password for that web site. If you do not have the password, please call the hospital operator.  11/04/2020, 6:59 PM

## 2020-11-04 NOTE — Consult Note (Addendum)
[facility]  Total days of antibiotics 5       Reason for Consult:ecoli bacteremia    Referring Physician: powell  Principal Problem:   Sepsis due to urinary tract infection ([place]) Active Problems:   Anemia   Hyponatremia   Diabetes mellitus, type 2 (HCC)    HPI: Heidi [name] is a 80 y.o. female who was admitted on 8/11 with lethargy and fever c/w sepsis of urinary source. She has hx of DM, Uterine cancer, CKD 3, s/p B/L nephrostromy tube exchanged on 8/14. She is feeling better. No nausea, vomiting or diarrhea. On admit she had leukocytosis now improved, LA > 2. Infectious work up revealed pansensitive  e.coli on blood cultures. She has been afebrile x 36hrs.  Past Medical History:  Diagnosis Date   Anemia    Chronic kidney disease    Diabetes mellitus without complication ([place])    Uterine cancer ([place])     Allergies:  Allergies  Allergen Reactions   Penicillins Itching, Nausea And Vomiting and Rash    Has patient had a PCN reaction causing immediate rash, facial/tongue/throat swelling, SOB or lightheadedness with hypotension: Yes Has patient had a PCN reaction causing severe rash involving mucus membranes or skin necrosis: No Has patient had a PCN reaction that required hospitalization No Has patient had a PCN reaction occurring within the last 10 years: No If all of the above answers are "NO", then may proceed with Cephalosporin use.     MEDICATIONS:  cephALEXin  500 mg Oral Q12H   enoxaparin (LOVENOX) injection  30 mg Subcutaneous Q24H   insulin aspart  0-9 Units Subcutaneous TID WC   sodium chloride flush  3 mL Intravenous Q12H    Social History   Tobacco Use   Smoking status: Never   Smokeless tobacco: Never  Substance Use Topics   Alcohol use: No   Drug use: No    Family History  Problem Relation Age of Onset   Uterine cancer Neg Hx      Review of Systems  Constitutional: Negative for fever, chills,  diaphoresis, activity change, appetite change, fatigue and unexpected weight change.  HENT: Negative for congestion, sore throat, rhinorrhea, sneezing, trouble swallowing and sinus pressure.  Eyes: Negative for photophobia and visual disturbance.  Respiratory: Negative for cough, chest tightness, shortness of breath, wheezing and stridor.  Cardiovascular: Negative for chest pain, palpitations and leg swelling.  Gastrointestinal: Negative for nausea, vomiting, abdominal pain, diarrhea, constipation, blood in stool, abdominal distention and anal bleeding.  Genitourinary: Negative for dysuria, hematuria, flank pain and difficulty urinating.  Musculoskeletal: Negative for myalgias, back pain, joint swelling, arthralgias and gait problem.  Skin: Negative for color change, pallor, rash and wound.  Neurological: Negative for dizziness, tremors, weakness and light-headedness.  Hematological: Negative for adenopathy. Does not bruise/bleed easily.  Psychiatric/Behavioral: Negative for behavioral problems, confusion, sleep disturbance, dysphoric mood, decreased concentration and agitation.    OBJECTIVE: Temp:  [97.8 F (36.6 C)-99.2 F (37.3 C)] 98.5 F (36.9 C) (08/15 1726) Pulse Rate:  [68-74] 68 (08/15 1726) Resp:  [15-21] 18 (08/15 1726) BP: (123-147)/(57-64) 147/62 (08/15 1726) SpO2:  [97 %-100 %] 100 % (08/15 1726) Physical Exam  Constitutional:  oriented to person, place, and time. appears well-developed and well-nourished. No distress.  HENT: [place]/AT, PERRLA, no scleral icterus Mouth/Throat: Oropharynx is clear and moist. No oropharyngeal exudate.  Cardiovascular: Normal rate, regular rhythm and normal heart sounds. Exam reveals no gallop and no friction rub.  No murmur heard.  Pulmonary/Chest: Effort normal and breath sounds normal. No respiratory distress.  has no wheezes.  Neck = supple, no nuchal rigidity Abdominal: Soft. Bowel sounds are normal.  exhibits no distension. There is no  tenderness. L and R+ perc neph in place Lymphadenopathy: no cervical adenopathy. No axillary adenopathy Neurological: alert and oriented to person, place, and time.  Skin: Skin is warm and dry. No rash noted. No erythema.  Psychiatric: a normal mood and affect.  behavior is normal.    LABS: Results for orders placed or performed during the hospital encounter of 10/31/20 (from the past 48 hour(s))  Urinalysis, Routine w reflex microscopic Urine, Clean Catch     Status: Abnormal   Collection Time: 11/02/20  9:31 PM  Result Value Ref Range   Color, Urine YELLOW YELLOW   APPearance CLOUDY (A) CLEAR   Specific Gravity, Urine 1.011 1.005 - 1.030   pH 8.0 5.0 - 8.0   Glucose, UA NEGATIVE NEGATIVE mg/dL   Hgb urine dipstick MODERATE (A) NEGATIVE   Bilirubin Urine NEGATIVE NEGATIVE   Ketones, ur NEGATIVE NEGATIVE mg/dL   Protein, ur 100 (A) NEGATIVE mg/dL   Nitrite NEGATIVE NEGATIVE   Leukocytes,Ua LARGE (A) NEGATIVE   RBC / HPF 11-20 0 - 5 RBC/hpf   WBC, UA >50 (H) 0 - 5 WBC/hpf   Bacteria, UA FEW (A) NONE SEEN   Squamous Epithelial / LPF 21-50 0 - 5   WBC Clumps PRESENT    Mucus PRESENT    Non Squamous Epithelial 0-5 (A) NONE SEEN    Comment: Performed at [facility], [address], [place], [place] [address]  Procalcitonin     Status: None   Collection Time: 11/03/20  1:23 AM  Result Value Ref Range   Procalcitonin 21.93 ng/mL    Comment:        Interpretation: PCT >= 10 ng/mL: Important systemic inflammatory response, almost exclusively due to severe bacterial sepsis or septic shock. (NOTE)       Sepsis PCT Algorithm           Lower Respiratory Tract                                      Infection PCT Algorithm    ----------------------------     ----------------------------         PCT < 0.25 ng/mL                PCT < 0.10 ng/mL          Strongly encourage             Strongly discourage   discontinuation of antibiotics    initiation of antibiotics     ----------------------------     -----------------------------       PCT 0.25 - 0.50 ng/mL            PCT 0.10 - 0.25 ng/mL               OR       >80% decrease in PCT            Discourage initiation of                                            antibiotics  Encourage discontinuation           of antibiotics    ----------------------------     -----------------------------         PCT >= 0.50 ng/mL              PCT 0.26 - 0.50 ng/mL                AND       <80% decrease in PCT             Encourage initiation of                                             antibiotics       Encourage continuation           of antibiotics    ----------------------------     -----------------------------        PCT >= 0.50 ng/mL                  PCT > 0.50 ng/mL               AND         increase in PCT                  Strongly encourage                                      initiation of antibiotics    Strongly encourage escalation           of antibiotics                                     -----------------------------                                           PCT <= 0.25 ng/mL                                                 OR                                        > 80% decrease in PCT                                      Discontinue / Do not initiate                                             antibiotics  Performed at [facility], [place] [address], [place], [place] [address]   Vitamin B12     Status: None   Collection Time: 11/03/20  1:23 AM  Result  Value Ref Range   Vitamin B-12 741 180 - 914 pg/mL    Comment: (NOTE) This assay is not validated for testing neonatal or myeloproliferative syndrome specimens for Vitamin B12 levels. Performed at [facility], [place] [address] [place], [place] [address]   Folate     Status: None   Collection Time: 11/03/20  1:23 AM  Result Value Ref Range   Folate 12.3 >5.9 ng/mL    Comment: Performed at [facility], [place]  [address], [place], [place] [address]  Iron and TIBC     Status: Abnormal   Collection Time: 11/03/20  1:23 AM  Result Value Ref Range   Iron 17 (L) 28 - 170 ug/dL   TIBC 188 (L) 250 - 450 ug/dL   Saturation Ratios 9 (L) 10.4 - 31.8 %   UIBC 171 ug/dL    Comment: Performed at [facility], [place] [address] [place], [place] [address]  Ferritin     Status: None   Collection Time: 11/03/20  1:23 AM  Result Value Ref Range   Ferritin 163 11 - 307 ng/mL    Comment: Performed at [facility], [place] [address] [place], [place] [address]  Reticulocytes     Status: Abnormal   Collection Time: 11/03/20  1:23 AM  Result Value Ref Range   Retic Ct Pct 1.9 0.4 - 3.1 %   RBC. 2.87 (L) 3.87 - 5.11 MIL/uL   Retic Count, Absolute 54.5 19.0 - 186.0 K/uL   Immature Retic Fract 11.4 2.3 - 15.9 %    Comment: Performed at [facility] [address] [place], [place] [address]  CBC     Status: Abnormal   Collection Time: 11/03/20  1:23 AM  Result Value Ref Range   WBC 5.7 4.0 - 10.5 K/uL   RBC 2.95 (L) 3.87 - 5.11 MIL/uL   Hemoglobin 9.0 (L) 12.0 - 15.0 g/dL   HCT 27.5 (L) 36.0 - 46.0 %   MCV 93.2 80.0 - 100.0 fL   MCH 30.5 26.0 - 34.0 pg   MCHC 32.7 30.0 - 36.0 g/dL   RDW 14.5 11.5 - 15.5 %   Platelets 136 (L) 150 - 400 K/uL   nRBC 0.0 0.0 - 0.2 %    Comment: Performed at [facility], [place] [address], [place], [place] [address]  Basic metabolic panel     Status: Abnormal   Collection Time: 11/03/20  1:23 AM  Result Value Ref Range   Sodium 136 135 - 145 mmol/L   Potassium 3.7 3.5 - 5.1 mmol/L   Chloride 109 98 - 111 mmol/L   CO2 19 (L) 22 - 32 mmol/L   Glucose, Bld 97 70 - 99 mg/dL    Comment: Glucose reference range applies only to samples taken after fasting for at least 8 hours.   BUN 25 (H) 8 - 23 mg/dL   Creatinine, Ser 1.87 (H) 0.44 - 1.00 mg/dL   Calcium 8.4 (L) 8.9 - 10.3 mg/dL   GFR, Estimated 27 (L) >60 mL/min    Comment: (NOTE) Calculated using the CKD-EPI  Creatinine Equation (2021)    Anion gap 8 5 - 15    Comment: Performed at [facility] [address] [place], [place] [address]  Glucose, capillary     Status: Abnormal   Collection Time: 11/03/20  7:40 AM  Result Value Ref Range   Glucose-Capillary 118 (H) 70 - 99 mg/dL  Comment: Glucose reference range applies only to samples taken after fasting for at least 8 hours.  Culture, blood (routine x 2)     Status: None (Preliminary result)   Collection Time: 11/03/20 10:00 AM   Specimen: BLOOD  Result Value Ref Range   Specimen Description BLOOD LEFT ANTECUBITAL    Special Requests      BOTTLES DRAWN AEROBIC ONLY Blood Culture adequate volume   Culture      NO GROWTH < 24 HOURS Performed at [facility], [place] [address] [place], [place] [address]    Report Status PENDING   Culture, blood (routine x 2)     Status: None (Preliminary result)   Collection Time: 11/03/20 10:15 AM   Specimen: BLOOD  Result Value Ref Range   Specimen Description BLOOD RIGHT ANTECUBITAL    Special Requests      BOTTLES DRAWN AEROBIC ONLY Blood Culture adequate volume   Culture      NO GROWTH < 24 HOURS Performed at [facility], [place] [address], [place], [place] [address]    Report Status PENDING   Glucose, capillary     Status: Abnormal   Collection Time: 11/03/20 11:46 AM  Result Value Ref Range   Glucose-Capillary 203 (H) 70 - 99 mg/dL    Comment: Glucose reference range applies only to samples taken after fasting for at least 8 hours.  Glucose, capillary     Status: Abnormal   Collection Time: 11/03/20  4:51 PM  Result Value Ref Range   Glucose-Capillary 182 (H) 70 - 99 mg/dL    Comment: Glucose reference range applies only to samples taken after fasting for at least 8 hours.  Glucose, capillary     Status: Abnormal   Collection Time: 11/03/20  8:42 PM  Result Value Ref Range   Glucose-Capillary 146 (H) 70 - 99 mg/dL    Comment: Glucose reference range applies only to  samples taken after fasting for at least 8 hours.  CBC with Differential/Platelet     Status: Abnormal   Collection Time: 11/04/20 12:46 AM  Result Value Ref Range   WBC 3.9 (L) 4.0 - 10.5 K/uL   RBC 2.75 (L) 3.87 - 5.11 MIL/uL   Hemoglobin 8.1 (L) 12.0 - 15.0 g/dL   HCT 25.3 (L) 36.0 - 46.0 %   MCV 92.0 80.0 - 100.0 fL   MCH 29.5 26.0 - 34.0 pg   MCHC 32.0 30.0 - 36.0 g/dL   RDW 13.9 11.5 - 15.5 %   Platelets 135 (L) 150 - 400 K/uL   nRBC 0.0 0.0 - 0.2 %   Neutrophils Relative % 63 %   Neutro Abs 2.5 1.7 - 7.7 K/uL   Lymphocytes Relative 24 %   Lymphs Abs 1.0 0.7 - 4.0 K/uL   Monocytes Relative 8 %   Monocytes Absolute 0.3 0.1 - 1.0 K/uL   Eosinophils Relative 3 %   Eosinophils Absolute 0.1 0.0 - 0.5 K/uL   Basophils Relative 1 %   Basophils Absolute 0.0 0.0 - 0.1 K/uL   Immature Granulocytes 1 %   Abs Immature Granulocytes 0.02 0.00 - 0.07 K/uL    Comment: Performed at [facility], [address], [place], [place] [address]  Magnesium     Status: None   Collection Time: 11/04/20 12:46 AM  Result Value Ref Range   Magnesium 1.7 1.7 - 2.4 mg/dL    Comment: Performed at [facility] [address]  [place], [place] [address]  Phosphorus     Status: None   Collection Time: 11/04/20 12:46 AM  Result Value Ref Range   Phosphorus 2.6 2.5 - 4.6 mg/dL    Comment: Performed at [facility] [address] [place],  [address]  Comprehensive metabolic panel     Status: Abnormal   Collection Time: 11/04/20 12:46 AM  Result Value Ref Range   Sodium 136 135 - 145 mmol/L   Potassium 3.5 3.5 - 5.1 mmol/L   Chloride 109 98 - 111 mmol/L   CO2 20 (L) 22 - 32 mmol/L   Glucose, Bld 126 (H) 70 - 99 mg/dL    Comment: Glucose reference range applies only to samples taken after fasting for at least 8 hours.   BUN 19 8 - 23 mg/dL   Creatinine, Ser 1.73 (H) 0.44 - 1.00 mg/dL   Calcium 8.2 (L) 8.9 - 10.3 mg/dL   Total Protein 5.9 (L) 6.5 - 8.1 g/dL   Albumin 2.2 (L)  3.5 - 5.0 g/dL   AST 16 15 - 41 U/L   ALT 11 0 - 44 U/L   Alkaline Phosphatase 112 38 - 126 U/L   Total Bilirubin 0.3 0.3 - 1.2 mg/dL   GFR, Estimated 29 (L) >60 mL/min    Comment: (NOTE) Calculated using the CKD-EPI Creatinine Equation (2021)    Anion gap 7 5 - 15    Comment: Performed at [facility], [place] [address], [place], [place] [address]  Glucose, capillary     Status: Abnormal   Collection Time: 11/04/20  8:07 AM  Result Value Ref Range   Glucose-Capillary 137 (H) 70 - 99 mg/dL    Comment: Glucose reference range applies only to samples taken after fasting for at least 8 hours.  Glucose, capillary     Status: Abnormal   Collection Time: 11/04/20 12:57 PM  Result Value Ref Range   Glucose-Capillary 180 (H) 70 - 99 mg/dL    Comment: Glucose reference range applies only to samples taken after fasting for at least 8 hours.  Glucose, capillary     Status: Abnormal   Collection Time: 11/04/20  5:33 PM  Result Value Ref Range   Glucose-Capillary 108 (H) 70 - 99 mg/dL    Comment: Glucose reference range applies only to samples taken after fasting for at least 8 hours.    MICRO: 8/14 blood cx NGTD at 24hr 8/11 blood cx ecoli pan sensitive IMAGING: DG Chest Port 1 View  Result Date: 11/02/2020 CLINICAL DATA:  Fevers EXAM: PORTABLE CHEST 1 VIEW COMPARISON:  10/31/2020 FINDINGS: Cardiac shadow is stable. Aortic calcifications are seen. Right basilar atelectasis is noted new from the prior exam. The lungs are otherwise clear. No bony abnormality is noted. IMPRESSION: Mild right basilar atelectasis. Electronically Signed   By: Inez Catalina M.D.   On: 11/02/2020 19:05   IR NEPHROSTOMY EXCHANGE LEFT  Result Date: 11/03/2020 INDICATION: 80 year old female with history bilateral percutaneous nephrostomy, admitted for sepsis and referred for exchange EXAM: IR EXCHANGE NEPHROSTOMY LEFT; IR EXCHANGE NEPHROSTOMY RIGHT COMPARISON:  09/24/2020 MEDICATIONS: None ANESTHESIA/SEDATION: None  CONTRAST:  39mL OMNIPAQUE IOHEXOL 300 MG/ML SOLN - administered into the collecting system(s) FLUOROSCOPY TIME:  Fluoroscopy Time: 0 minutes 48 seconds (1 mGy). COMPLICATIONS: None PROCEDURE: Informed written consent was obtained from the patient after a thorough discussion of the procedural risks, benefits and alternatives. All questions were addressed. Maximal Sterile Barrier Technique was utilized including caps, mask, sterile gowns, sterile gloves,  sterile drape, hand hygiene and skin antiseptic. A timeout was performed prior to the initiation of the procedure. Left: 1% lidocaine was used for local anesthesia. Small amount of contrast was infused confirming location in the collecting system. Modified Seldinger technique was then used to exchange for a new 12 French percutaneous nephrostomy. Catheter was formed in the collecting system and contrast confirmed location. Catheter was sutured in location and attached to gravity drainage. Right: 1% lidocaine was used for local anesthesia. Small amount of contrast was infused confirming location in the collecting system. Modified Seldinger technique was then used to exchange for a new 12 French percutaneous nephrostomy. Catheter was formed in the collecting system and contrast confirmed location. Catheter was sutured in location and attached to gravity drainage. Patient tolerated the procedure well and remained hemodynamically stable throughout. No complications were encountered and no significant blood loss. IMPRESSION: Status post routine exchange of bilateral percutaneous nephrostomy. Signed, Dulcy Fanny. Dellia Nims, RPVI [facility] Radiology Electronically Signed   By: Corrie Mckusick D.O.   On: 11/03/2020 13:25   IR NEPHROSTOMY EXCHANGE RIGHT  Result Date: 11/03/2020 INDICATION: 80 year old female with history bilateral percutaneous nephrostomy, admitted for sepsis and referred for exchange EXAM: IR EXCHANGE  NEPHROSTOMY LEFT; IR EXCHANGE NEPHROSTOMY RIGHT COMPARISON:  09/24/2020 MEDICATIONS: None ANESTHESIA/SEDATION: None CONTRAST:  89mL OMNIPAQUE IOHEXOL 300 MG/ML SOLN - administered into the collecting system(s) FLUOROSCOPY TIME:  Fluoroscopy Time: 0 minutes 48 seconds (1 mGy). COMPLICATIONS: None PROCEDURE: Informed written consent was obtained from the patient after a thorough discussion of the procedural risks, benefits and alternatives. All questions were addressed. Maximal Sterile Barrier Technique was utilized including caps, mask, sterile gowns, sterile gloves, sterile drape, hand hygiene and skin antiseptic. A timeout was performed prior to the initiation of the procedure. Left: 1% lidocaine was used for local anesthesia. Small amount of contrast was infused confirming location in the collecting system. Modified Seldinger technique was then used to exchange for a new 12 French percutaneous nephrostomy. Catheter was formed in the collecting system and contrast confirmed location. Catheter was sutured in location and attached to gravity drainage. Right: 1% lidocaine was used for local anesthesia. Small amount of contrast was infused confirming location in the collecting system. Modified Seldinger technique was then used to exchange for a new 12 French percutaneous nephrostomy. Catheter was formed in the collecting system and contrast confirmed location. Catheter was sutured in location and attached to gravity drainage. Patient tolerated the procedure well and remained hemodynamically stable throughout. No complications were encountered and no significant blood loss. IMPRESSION: Status post routine exchange of bilateral percutaneous nephrostomy. Signed, Dulcy Fanny. Dellia Nims, RPVI [facility] Radiology Electronically Signed   By: Corrie Mckusick D.O.   On: 11/03/2020 13:25    HISTORICAL MICRO/IMAGING  Assessment/Plan:  80XB F with complicated uti with secondary  e.coli bacteremia, improving after 4 days of IV abtx - recommend to switch to cephalexin to finish out course for additional 6 days to complete 10 day course of treatment and account that it is 48hrs since being afebrile.   Leukocytosis = improving. Recommend to repeat CBC with diff tomorrow to see that she does not have worsening leukopenia.  CKD 3= at baseline.

## 2020-11-04 NOTE — Evaluation (Signed)
Physical Therapy Evaluation Patient Details Name: Heidi [name] MRN: [id number] DOB: 10-14-1940 Today's Date: 11/04/2020   History of Present Illness  80 yo female admitted 10/31/20 with lethargy and fever. Found to have sepsis due to UTI. PMH DM, uterine CA, nephrostomy placement and exchange  Clinical Impression   Patient received in bed, pleasant and cooperative; family present and interpreted for PT and patient during session. Able to mobilize on a min guard basis, and actually tolerated two longer distance bouts of gait in the hallway with a stop in the bathroom in between. VSS on RA. Did tend to drift R occasionally but this improved with continued activity. Left in bed with all needs met, family present. Will continue to follow in-house, do not anticipate skilled f/u or DME needs after DC.     Follow Up Recommendations No PT follow up    Equipment Recommendations  None recommended by PT    Recommendations for Other Services       Precautions / Restrictions Precautions Precautions: Other (comment) Precaution Comments: B neph bags Restrictions Weight Bearing Restrictions: No      Mobility  Bed Mobility Overal bed mobility: Modified Independent             General bed mobility comments: HOB elevated, use of rails    Transfers Overall transfer level: Needs assistance   Transfers: Sit to/from Stand Sit to Stand: Min guard         General transfer comment: min guard for safety and line management, no physical assist given  Ambulation/Gait Ambulation/Gait assistance: Min guard Gait Distance (Feet): 500 Feet (219f, 3046f Assistive device: None Gait Pattern/deviations: Step-through pattern;Trunk flexed;Drifts right/left Gait velocity: decreased   General Gait Details: slow and mildly unsteady at times, tends to drift right but this did improve during second bout of gait. Able to manage B neph bags independently with gait  Stairs             Wheelchair Mobility    Modified Rankin (Stroke Patients Only)       Balance Overall balance assessment: Needs assistance Sitting-balance support: No upper extremity supported;Feet supported Sitting balance-Leahy Scale: Normal     Standing balance support: No upper extremity supported;During functional activity Standing balance-Leahy Scale: Good                               Pertinent Vitals/Pain Pain Assessment: No/denies pain    Home Living Family/patient expects to be discharged to:: Private residence Living Arrangements: Children Available Help at Discharge: Family;Available 24 hours/day Type of Home: House Home Access: Stairs to enter   [employer] Steps: 2 STE no rails Home Layout: One level Home Equipment: None      Prior Function Level of Independence: Independent               Hand Dominance        Extremity/Trunk Assessment   Upper Extremity Assessment Upper Extremity Assessment: Generalized weakness    Lower Extremity Assessment Lower Extremity Assessment: Generalized weakness    Cervical / Trunk Assessment Cervical / Trunk Assessment: Kyphotic  Communication   Communication: Prefers language other than English  Cognition Arousal/Alertness: Awake/alert Behavior During Therapy: WFL for tasks assessed/performed Overall Cognitive Status: Within Functional Limits for tasks assessed  General Comments General comments (skin integrity, edema, etc.): VSS on RA    Exercises     Assessment/Plan    PT Assessment Patient needs continued PT services  PT Problem List Decreased strength;Decreased balance       PT Treatment Interventions DME instruction;Balance training;Gait training;Stair training;Functional mobility training;Patient/family education;Therapeutic activities;Therapeutic exercise    PT Goals (Current goals can be found in the Care Plan section)   Acute Rehab PT Goals Patient Stated Goal: go home PT Goal Formulation: With patient/family Time For Goal Achievement: 11/18/20 Potential to Achieve Goals: Good    Frequency Min 2X/week   Barriers to discharge        Co-evaluation               AM-PAC PT "6 Clicks" Mobility  Outcome Measure Help needed turning from your back to your side while in a flat bed without using bedrails?: None Help needed moving from lying on your back to sitting on the side of a flat bed without using bedrails?: None Help needed moving to and from a bed to a chair (including a wheelchair)?: None Help needed standing up from a chair using your arms (e.g., wheelchair or bedside chair)?: None Help needed to walk in hospital room?: A Little Help needed climbing 3-5 steps with a railing? : A Little 6 Click Score: 22    End of Session   Activity Tolerance: Patient tolerated treatment well Patient left: in bed;with call bell/phone within reach;with family/visitor present Nurse Communication: Mobility status PT Visit Diagnosis: Unsteadiness on feet (R26.81);Muscle weakness (generalized) (M62.81)    Time: 3235-5732 PT Time Calculation (min) (ACUTE ONLY): 30 min   Charges:   PT Evaluation $PT Eval Moderate Complexity: 1 Mod (medicaid eval)         Ann Lions PT, DPT, PN2   Supplemental Physical Therapist [place]    Pager [phone] Acute Rehab Office [phone]

## 2020-11-05 ENCOUNTER — Inpatient Hospital Stay (HOSPITAL_COMMUNITY): Admission: RE | Admit: 2020-11-05 | Payer: Medicaid Other | Source: Ambulatory Visit

## 2020-11-05 LAB — COMPREHENSIVE METABOLIC PANEL
ALT: 14 U/L (ref 0–44)
AST: 37 U/L (ref 15–41)
Albumin: 2.3 g/dL — ABNORMAL LOW (ref 3.5–5.0)
Alkaline Phosphatase: 107 U/L (ref 38–126)
Anion gap: 7 (ref 5–15)
BUN: 20 mg/dL (ref 8–23)
CO2: 21 mmol/L — ABNORMAL LOW (ref 22–32)
Calcium: 8.6 mg/dL — ABNORMAL LOW (ref 8.9–10.3)
Chloride: 108 mmol/L (ref 98–111)
Creatinine, Ser: 1.61 mg/dL — ABNORMAL HIGH (ref 0.44–1.00)
GFR, Estimated: 32 mL/min — ABNORMAL LOW (ref >60)
Glucose, Bld: 122 mg/dL — ABNORMAL HIGH (ref 70–99)
Potassium: 3.4 mmol/L — ABNORMAL LOW (ref 3.5–5.1)
Sodium: 136 mmol/L (ref 135–145)
Total Bilirubin: 0.2 mg/dL — ABNORMAL LOW (ref 0.3–1.2)
Total Protein: 6.1 g/dL — ABNORMAL LOW (ref 6.5–8.1)

## 2020-11-05 LAB — CBC WITH DIFFERENTIAL/PLATELET
Abs Immature Granulocytes: 0.03 10*3/uL (ref 0.00–0.07)
Basophils Absolute: 0 10*3/uL (ref 0.0–0.1)
Basophils Relative: 0 %
Eosinophils Absolute: 0.2 10*3/uL (ref 0.0–0.5)
Eosinophils Relative: 5 %
HCT: 25.7 % — ABNORMAL LOW (ref 36.0–46.0)
Hemoglobin: 8.6 g/dL — ABNORMAL LOW (ref 12.0–15.0)
Immature Granulocytes: 1 %
Lymphocytes Relative: 31 %
Lymphs Abs: 1.2 10*3/uL (ref 0.7–4.0)
MCH: 30 pg (ref 26.0–34.0)
MCHC: 33.5 g/dL (ref 30.0–36.0)
MCV: 89.5 fL (ref 80.0–100.0)
Monocytes Absolute: 0.4 10*3/uL (ref 0.1–1.0)
Monocytes Relative: 9 %
Neutro Abs: 2 10*3/uL (ref 1.7–7.7)
Neutrophils Relative %: 54 %
Platelets: 156 10*3/uL (ref 150–400)
RBC: 2.87 MIL/uL — ABNORMAL LOW (ref 3.87–5.11)
RDW: 13.8 % (ref 11.5–15.5)
WBC: 3.8 10*3/uL — ABNORMAL LOW (ref 4.0–10.5)
nRBC: 0 % (ref 0.0–0.2)

## 2020-11-05 LAB — GLUCOSE, CAPILLARY
Glucose-Capillary: 116 mg/dL — ABNORMAL HIGH (ref 70–99)
Glucose-Capillary: 125 mg/dL — ABNORMAL HIGH (ref 70–99)
Glucose-Capillary: 153 mg/dL — ABNORMAL HIGH (ref 70–99)

## 2020-11-05 LAB — MAGNESIUM: Magnesium: 1.6 mg/dL — ABNORMAL LOW (ref 1.7–2.4)

## 2020-11-05 LAB — PHOSPHORUS: Phosphorus: 2.9 mg/dL (ref 2.5–4.6)

## 2020-11-05 MED ORDER — CEPHALEXIN 500 MG PO CAPS: 500.0000 mg | ORAL_CAPSULE | Freq: Two times a day (BID) | ORAL | 0 refills | Status: AC

## 2020-11-05 MED ORDER — FERROUS SULFATE 325 (65 FE) MG PO TABS: 325.0000 mg | ORAL_TABLET | Freq: Every day | ORAL | 3 refills | Status: DC

## 2020-11-05 MED ORDER — POTASSIUM CHLORIDE CRYS ER 20 MEQ PO TBCR
40.0000 meq | EXTENDED_RELEASE_TABLET | Freq: Once | ORAL | Status: AC
  Administered 2020-11-05: 40 meq via ORAL
  Filled 2020-11-05: qty 2

## 2020-11-05 NOTE — Progress Notes (Signed)
Pt discharged to home, Discharge instructions and medication education provided to patient and daughter at bedside. All questions and concerns answered.

## 2020-11-05 NOTE — Discharge Summary (Signed)
Physician Discharge Summary  Heidi [name] LPF:[id number] DOB: 1941/01/04 DOA: 10/31/2020  PCP: Patient, No Pcp Per (Inactive)  Admit date: 10/31/2020 Discharge date: 11/05/2020  Time spent: 40 minutes  Recommendations for Outpatient Follow-up:  Follow outpatient CBC/CMP Complete abx per ID recs Resume follow up with IR for nephrostomy tube exchanges as scheduled Follow with urology  Follow with PCP Follow iron def anemia    Discharge Diagnoses:  Principal Problem:   Sepsis due to urinary tract infection (Heidi [name]) Active Problems:   Anemia   Hyponatremia   Diabetes mellitus, type 2 ([place])   Discharge Condition: stable  Diet recommendation: heart ehalthy  Filed Weights   10/31/20 2133  Weight: 45.4 kg    History of present illness:  80 year old female with CKD stage III, cervical cancer s/p chemoradiation, 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  She's improved with antibiotics and nephrostomy exchange.  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   Pro-Cal elevated    Urine culture mixed organism  blood culture growing Heidi [name], pan sensitive - plan for ancef   Renal ultrasound with moderate L sided hydro - discussed with IR, now s/p nephrostomy exchange with IR ID recommending keflex to complete course   Anemia  Iron Deficiency:  Labs suggest iron def anemia Discharged with iron   Anion Gap Metabolic acidosis: bicarb 19 in 21s-suspect in the setting of CKD.  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  Overall stable.   Hyponatremia: Resolved   Diarhea 8/11- c diff ordered, no recurrence    Diabetes mellitus, type 2:  Blood sugar well controlled  at home on glyburide, metformin at home holding for now, continue sliding scale.  Procedures: Bilateral nephrostomy tube exchange  Consultations: ID IR  Discharge Exam: Vitals:   11/05/20 0737 11/05/20 1143  BP: 130/61 131/62  Pulse: 66 71  Resp: 15 16  Temp: 98 F (36.7 C) 98.4 F (36.9 C)  SpO2: 98% 98%   Eager for discharge home Family member at bedside assisted with translation  General: No acute distress. Cardiovascular: Heart sounds show Heidi [name] regular rate, and rhythm.  Lungs: Clear to auscultation bilaterally Abdomen: Soft, nontender, nondistended  Bilateral nephrostomy tubes Neurological: Alert and oriented 3. Moves all extremities 4. Cranial nerves II through XII grossly intact. Skin: Warm and dry. No rashes or lesions. Extremities: No clubbing or cyanosis. No edema.  Discharge Instructions   Discharge Instructions     Call MD for:  difficulty breathing, headache or visual disturbances   Complete by: As directed    Call MD for:  extreme fatigue   Complete by: As directed    Call MD for:  hives   Complete by: As directed    Call MD for:  persistant dizziness or light-headedness   Complete by: As directed    Call MD for:  persistant nausea and vomiting   Complete by: As directed    Call MD for:  redness, tenderness, or signs of infection (pain, swelling, redness, odor or green/yellow discharge around incision site)   Complete by: As  directed    Call MD for:  severe uncontrolled pain   Complete by: As directed    Call MD for:  temperature >100.4   Complete by: As directed    Diet - low sodium heart healthy   Complete by: As directed    Discharge instructions   Complete by: As directed    You were seen for Heidi [name] blood stream infection related to Heidi [name] UTI.  You've  improved with antibiotics and now have had your nephrostomy tubes exchanged.  Follow up with urology, interventional radiology, and your PCP as scheduled.   Complete Heidi [name] course of antibiotics as prescribed.  Keflex for the next 6 days.  You have iron deficiency anemia.  Please follow up with your PCP for additional work up and follow up.  Return for new, recurrent, or worsening symptoms.  Please ask your PCP to request records from this hospitalization so they know what was done and what the next steps will be.   Increase activity slowly   Complete by: As directed       Allergies as of 11/05/2020       Reactions   Penicillins Itching, Nausea And Vomiting, Rash   Has patient had Heidi [name] causing immediate rash, facial/tongue/throat swelling, SOB or lightheadedness with hypotension: Yes Has patient had Heidi [name] causing severe rash involving mucus membranes or skin necrosis: No Has patient had Heidi [name] that required hospitalization No Has patient had Heidi [name] occurring within the last 10 years: No If all of the above answers are "NO", then may proceed with Cephalosporin use.        Medication List     TAKE these medications    acetaminophen 500 MG tablet Commonly known as: TYLENOL Take 500 mg by mouth every 6 (six) hours as needed for mild pain.   amLODipine 2.5 MG tablet Commonly known as: NORVASC Take 1 tablet (2.5 mg total) by mouth daily.   cephALEXin 500 MG capsule Commonly known as: KEFLEX Take 1 capsule (500 mg total) by mouth every 12 (twelve) hours for 6 days.   ferrous sulfate 325 (65 FE) MG tablet Take 1 tablet (325 mg total) by mouth daily.   glimepiride 2 MG tablet Commonly known as: AMARYL Take 2 mg by mouth daily.   Vitamin D3 1.25 MG (50000 UT) Caps Take 1 capsule by mouth once Heidi [name] week.       Allergies  Allergen Reactions   Penicillins Itching, Nausea And Vomiting and Rash    Has patient had Heidi [name] PCN [name] causing  immediate rash, facial/tongue/throat swelling, SOB or lightheadedness with hypotension: Yes Has patient had Heidi [name] PCN [name] causing severe rash involving mucus membranes or skin necrosis: No Has patient had Memory Heinrichs PCN [name] that required hospitalization No Has patient had Rolfe Hartsell PCN [name] occurring within the last 10 years: No If all of the above answers are "NO", then may proceed with Cephalosporin use.     Follow-up Information     Alexis Frock, MD Follow up.   Specialty: Urology Contact information: [place] [address] [phone]         [place] Follow up.   Specialty: Radiology Contact information: [address] [id number] [place] [phone]                 The results of significant diagnostics from this hospitalization (including imaging, microbiology, ancillary and laboratory) are listed below for reference.  Significant Diagnostic Studies: DG Chest 1 View  Result Date: 10/31/2020 CLINICAL DATA:  Sepsis EXAM: CHEST  1 VIEW COMPARISON:  01/10/2016 FINDINGS: Heart and mediastinal contours are within normal limits. No focal opacities or effusions. No acute bony abnormality. Aortic atherosclerosis. IMPRESSION: No active cardiopulmonary disease. Electronically Signed   By: Rolm Baptise M.D.   On: 10/31/2020 18:23   US RENAL  Result Date: 11/02/2020 CLINICAL DATA:  UTI EXAM: RENAL / URINARY TRACT ULTRASOUND COMPLETE COMPARISON:  None. FINDINGS: Right Kidney: Renal measurements: 10.1 x 4.1 x 5.3 cm. = volume: 115 mL. Mild cortical thinning is noted. Prominent extrarenal pelvis is noted. Left Kidney: Renal measurements: 9.9 x 4.6 x 5.6 cm. = volume: 133 mL. Moderate left-sided hydronephrosis is noted. Bladder: Bladder is partially distended. Other: None. IMPRESSION: Moderate left-sided hydronephrosis. It should be noted patient has known bilateral nephrostomy catheters  although the catheters themselves are not definitively seen in the renal pelves bilaterally. Given the hydronephrosis, interventional Radiology consultation may be helpful. Electronically Signed   By: Inez Catalina M.D.   On: 11/02/2020 18:07   DG Chest Port 1 View  Result Date: 11/02/2020 CLINICAL DATA:  Fevers EXAM: PORTABLE CHEST 1 VIEW COMPARISON:  10/31/2020 FINDINGS: Cardiac shadow is stable. Aortic calcifications are seen. Right basilar atelectasis is noted new from the prior exam. The lungs are otherwise clear. No bony abnormality is noted. IMPRESSION: Mild right basilar atelectasis. Electronically Signed   By: Inez Catalina M.D.   On: 11/02/2020 19:05   IR NEPHROSTOMY EXCHANGE LEFT  Result Date: 11/03/2020 INDICATION: 80 year old female with history bilateral percutaneous nephrostomy, admitted for sepsis and referred for exchange EXAM: IR EXCHANGE NEPHROSTOMY LEFT; IR EXCHANGE NEPHROSTOMY RIGHT COMPARISON:  09/24/2020 MEDICATIONS: None ANESTHESIA/SEDATION: None CONTRAST:  66m OMNIPAQUE IOHEXOL 300 MG/ML SOLN - administered into the collecting system(s) FLUOROSCOPY TIME:  Fluoroscopy Time: 0 minutes 48 seconds (1 mGy). COMPLICATIONS: None PROCEDURE: Informed written consent was obtained from the patient after Mackenna Kamer thorough discussion of the procedural risks, benefits and alternatives. All questions were addressed. Maximal Sterile Barrier Technique was utilized including caps, mask, sterile gowns, sterile gloves, sterile drape, hand hygiene and skin antiseptic. Jahson Emanuele timeout was performed prior to the initiation of the procedure. Left: 1% lidocaine was used for local anesthesia. Small amount of contrast was infused confirming location in the collecting system. Modified Seldinger technique was then used to exchange for Selby Foisy new 12 French percutaneous nephrostomy. Catheter was formed in the collecting system and contrast confirmed location. Catheter was sutured in location and attached to gravity drainage. Right:  1% lidocaine was used for local anesthesia. Small amount of contrast was infused confirming location in the collecting system. Modified Seldinger technique was then used to exchange for Inocencia Murtaugh new 12 French percutaneous nephrostomy. Catheter was formed in the collecting system and contrast confirmed location. Catheter was sutured in location and attached to gravity drainage. Patient tolerated the procedure well and remained hemodynamically stable throughout. No complications were encountered and no significant blood loss. IMPRESSION: Status post routine exchange of bilateral percutaneous nephrostomy. Signed, JDulcy Fanny WDellia Nims RPVI [facility] Electronically Signed   By: JCorrie MckusickD.O.   On: 11/03/2020 13:25   IR NEPHROSTOMY EXCHANGE RIGHT  Result Date: 11/03/2020 INDICATION: 80year old female with history bilateral percutaneous nephrostomy, admitted for sepsis and referred for exchange EXAM: IR EXCHANGE NEPHROSTOMY LEFT; IR EXCHANGE NEPHROSTOMY RIGHT COMPARISON:  09/24/2020 MEDICATIONS: None ANESTHESIA/SEDATION: None CONTRAST:  122mOMNIPAQUE IOHEXOL 300 MG/ML SOLN - administered into  the collecting system(s) FLUOROSCOPY TIME:  Fluoroscopy Time: 0 minutes 48 seconds (1 mGy). COMPLICATIONS: None PROCEDURE: Informed written consent was obtained from the patient after Jillane Po thorough discussion of the procedural risks, benefits and alternatives. All questions were addressed. Maximal Sterile Barrier Technique was utilized including caps, mask, sterile gowns, sterile gloves, sterile drape, hand hygiene and skin antiseptic. Elzy Tomasello timeout was performed prior to the initiation of the procedure. Left: 1% lidocaine was used for local anesthesia. Small amount of contrast was infused confirming location in the collecting system. Modified Seldinger technique was then used to exchange for Valerye Kobus new 12 French percutaneous nephrostomy. Catheter was formed in the collecting system  and contrast confirmed location. Catheter was sutured in location and attached to gravity drainage. Right: 1% lidocaine was used for local anesthesia. Small amount of contrast was infused confirming location in the collecting system. Modified Seldinger technique was then used to exchange for Shraga Custard new 12 French percutaneous nephrostomy. Catheter was formed in the collecting system and contrast confirmed location. Catheter was sutured in location and attached to gravity drainage. Patient tolerated the procedure well and remained hemodynamically stable throughout. No complications were encountered and no significant blood loss. IMPRESSION: Status post routine exchange of bilateral percutaneous nephrostomy. Signed, Dulcy Fanny. Dellia Nims, RPVI [facility] Electronically Signed   By: Corrie Mckusick D.O.   On: 11/03/2020 13:25    Microbiology: Recent Results (from the past 240 hour(s))  Blood Culture (routine x 2)     Status: Abnormal   Collection Time: 10/31/20  5:18 PM   Specimen: BLOOD LEFT ARM  Result Value Ref Range Status   Specimen Description BLOOD LEFT ARM  Final   Special Requests   Final    BOTTLES DRAWN AEROBIC AND ANAEROBIC Blood Culture adequate volume   Culture  Setup Time   Final    GRAM NEGATIVE RODS ANAEROBIC BOTTLE ONLY CRITICAL RESULT CALLED TO, READ BACK BY AND VERIFIED WITH: PHARMD CATHY PIERCE 11/01/20_0 :49 BY TW GRAM VARIABLE ROD AEROBIC BOTTLE ONLY Performed at [facility], [place] [address], [place], [place] [address]    Culture ESCHERICHIA [name] (Param Capri)  Final   Report Status 11/03/2020 FINAL  Final   Organism ID, Bacteria ESCHERICHIA [name]  Final      Susceptibility   Escherichia [name] - MIC*    AMPICILLIN <=2 SENSITIVE Sensitive     CEFAZOLIN <=4 SENSITIVE Sensitive     CEFEPIME <=0.12 SENSITIVE Sensitive     CEFTAZIDIME <=1 SENSITIVE Sensitive     CEFTRIAXONE <=0.25 SENSITIVE Sensitive     CIPROFLOXACIN <=0.25  SENSITIVE Sensitive     GENTAMICIN <=1 SENSITIVE Sensitive     IMIPENEM <=0.25 SENSITIVE Sensitive     TRIMETH/SULFA <=20 SENSITIVE Sensitive     AMPICILLIN/SULBACTAM <=2 SENSITIVE Sensitive     PIP/TAZO <=4 SENSITIVE Sensitive     * ESCHERICHIA [name]  Blood Culture ID Panel (Reflexed)     Status: Abnormal   Collection Time: 10/31/20  5:18 PM  Result Value Ref Range Status   Enterococcus faecalis NOT DETECTED NOT DETECTED Final   Enterococcus Faecium NOT DETECTED NOT DETECTED Final   Listeria monocytogenes NOT DETECTED NOT DETECTED Final   Staphylococcus species NOT DETECTED NOT DETECTED Final   Staphylococcus aureus (BCID) NOT DETECTED NOT DETECTED Final   Staphylococcus epidermidis NOT DETECTED NOT DETECTED Final   Staphylococcus lugdunensis NOT DETECTED NOT DETECTED Final   Streptococcus species NOT DETECTED NOT DETECTED Final   Streptococcus agalactiae NOT  DETECTED NOT DETECTED Final   Streptococcus pneumoniae NOT DETECTED NOT DETECTED Final   Streptococcus pyogenes NOT DETECTED NOT DETECTED Final   Takita Riecke.calcoaceticus-baumannii NOT DETECTED NOT DETECTED Final   Bacteroides fragilis NOT DETECTED NOT DETECTED Final   Enterobacterales DETECTED (Shloimy Michalski) NOT DETECTED Final    Comment: Enterobacterales represent Kaspar Albornoz large order of gram negative bacteria, not Alzora Ha single organism. CRITICAL RESULT CALLED TO, READ BACK BY AND VERIFIED WITH: PHARMD CATHY PIERCE 11/01/20_0 :36 BY TW    Enterobacter cloacae complex NOT DETECTED NOT DETECTED Final   Escherichia [name] DETECTED (Rockne Dearinger) NOT DETECTED Final    Comment: CRITICAL RESULT CALLED TO, READ BACK BY AND VERIFIED WITH: PHARMD CATHY PIERCE 11/01/20_1 :36 BY TW    Klebsiella aerogenes NOT DETECTED NOT DETECTED Final   Klebsiella oxytoca NOT DETECTED NOT DETECTED Final   Klebsiella pneumoniae NOT DETECTED NOT DETECTED Final   Proteus species NOT DETECTED NOT DETECTED Final   Salmonella species NOT DETECTED NOT DETECTED Final   Serratia marcescens NOT DETECTED  NOT DETECTED Final   Haemophilus influenzae NOT DETECTED NOT DETECTED Final   Neisseria meningitidis NOT DETECTED NOT DETECTED Final   Pseudomonas aeruginosa NOT DETECTED NOT DETECTED Final   Stenotrophomonas maltophilia NOT DETECTED NOT DETECTED Final   Candida albicans NOT DETECTED NOT DETECTED Final   Candida auris NOT DETECTED NOT DETECTED Final   Candida glabrata NOT DETECTED NOT DETECTED Final   Candida krusei NOT DETECTED NOT DETECTED Final   Candida parapsilosis NOT DETECTED NOT DETECTED Final   Candida tropicalis NOT DETECTED NOT DETECTED Final   Cryptococcus neoformans/gattii NOT DETECTED NOT DETECTED Final   CTX-M ESBL NOT DETECTED NOT DETECTED Final   Carbapenem resistance IMP NOT DETECTED NOT DETECTED Final   Carbapenem resistance KPC NOT DETECTED NOT DETECTED Final   Carbapenem resistance NDM NOT DETECTED NOT DETECTED Final   Carbapenem resist OXA 48 LIKE NOT DETECTED NOT DETECTED Final   Carbapenem resistance VIM NOT DETECTED NOT DETECTED Final    Comment: Performed at [facility], [address], [place], [place] [address]  Resp Panel by RT-PCR (Flu Hadlei Stitt&B, Covid) Nasopharyngeal Swab     Status: None   Collection Time: 10/31/20  5:22 PM   Specimen: Nasopharyngeal Swab; Nasopharyngeal(NP) swabs in vial transport medium  Result Value Ref Range Status   SARS Coronavirus 2 by RT PCR NEGATIVE NEGATIVE Final    Comment: (NOTE) SARS-CoV-2 target nucleic acids are NOT DETECTED.  The SARS-CoV-2 RNA is generally detectable in upper respiratory specimens during the acute phase of infection. The lowest concentration of SARS-CoV-2 viral copies this assay can detect is 138 copies/mL. Lukah Goswami negative result does not preclude SARS-Cov-2 infection and should not be used as the sole basis for treatment or other patient management decisions. Jaxsyn Catalfamo negative result may occur with  improper specimen collection/handling, submission of specimen other than nasopharyngeal swab, presence of viral  mutation(s) within the areas targeted by this assay, and inadequate number of viral copies(<138 copies/mL). Dustin Burrill negative result must be combined with clinical observations, patient history, and epidemiological information. The expected result is Negative.  Fact Sheet for Patients:  EntrepreneurPulse.com.au  Fact Sheet for Healthcare Providers:  IncredibleEmployment.be  This test is no t yet approved or cleared by the [place] FDA and  has been authorized for detection and/or diagnosis of SARS-CoV-2 by FDA under an Emergency Use Authorization (EUA). This EUA will remain  in effect (meaning this test can be used) for the duration of the COVID-19 declaration under Section 564(b)(1) of  the Act, 21 U.S.C.section 360bbb-3(b)(1), unless the authorization is terminated  or revoked sooner.       Influenza Juanantonio Stolar by PCR NEGATIVE NEGATIVE Final   Influenza B by PCR NEGATIVE NEGATIVE Final    Comment: (NOTE) The Xpert Xpress SARS-CoV-2/FLU/RSV plus assay is intended as an aid in the diagnosis of influenza from Nasopharyngeal swab specimens and should not be used as Nishat Livingston sole basis for treatment. Nasal washings and aspirates are unacceptable for Xpert Xpress SARS-CoV-2/FLU/RSV testing.  Fact Sheet for Patients: EntrepreneurPulse.com.au  Fact Sheet for Healthcare Providers: IncredibleEmployment.be  This test is not yet approved or cleared by the [place] FDA and has been authorized for detection and/or diagnosis of SARS-CoV-2 by FDA under an Emergency Use Authorization (EUA). This EUA will remain in effect (meaning this test can be used) for the duration of the COVID-19 declaration under Section 564(b)(1) of the Act, 21 U.S.C. section 360bbb-3(b)(1), unless the authorization is terminated or revoked.  Performed at [facility], [place] [address] [place], [place] [address]   Blood Culture (routine x 2)      Status: Abnormal   Collection Time: 10/31/20  7:30 PM   Specimen: BLOOD  Result Value Ref Range Status   Specimen Description BLOOD LEFT FOREARM  Final   Special Requests   Final    BOTTLES DRAWN AEROBIC AND ANAEROBIC Blood Culture adequate volume   Culture  Setup Time   Final    GRAM NEGATIVE RODS AEROBIC BOTTLE ONLY CRITICAL VALUE NOTED.  VALUE IS CONSISTENT WITH PREVIOUSLY REPORTED AND CALLED VALUE.    Culture (Marcy Sookdeo)  Final    ESCHERICHIA [name] SUSCEPTIBILITIES PERFORMED ON PREVIOUS CULTURE WITHIN THE LAST 5 DAYS. Performed at [facility], [place] [address] [place], [place] [address]    Report Status 11/04/2020 FINAL  Final  Urine Culture     Status: Abnormal   Collection Time: 10/31/20 11:27 PM   Specimen: In/Out Cath Urine  Result Value Ref Range Status   Specimen Description IN/OUT CATH URINE  Final   Special Requests   Final    NONE Performed at [facility], [place] [address] [place], [place] [address]    Culture MULTIPLE SPECIES PRESENT, SUGGEST RECOLLECTION (Tyarra Nolton)  Final   Report Status 11/02/2020 FINAL  Final  C Difficile Quick Screen w PCR reflex     Status: None   Collection Time: 11/02/20 11:00 AM   Specimen: STOOL  Result Value Ref Range Status   C Diff antigen NEGATIVE NEGATIVE Final   C Diff toxin NEGATIVE NEGATIVE Final   C Diff interpretation No C. difficile detected.  Final    Comment: Performed at [facility], [place] [address] [place], [place] [address]  Culture, blood (routine x 2)     Status: None (Preliminary result)   Collection Time: 11/03/20 10:00 AM   Specimen: BLOOD  Result Value Ref Range Status   Specimen Description BLOOD LEFT ANTECUBITAL  Final   Special Requests   Final    BOTTLES DRAWN AEROBIC ONLY Blood Culture adequate volume   Culture   Final    NO GROWTH 2 DAYS Performed at [facility], [place] [address], [place],  [address]    Report Status PENDING  Incomplete  Culture, blood (routine x 2)     Status: None  (Preliminary result)   Collection Time: 11/03/20 10:15 AM   Specimen: BLOOD  Result Value Ref Range Status   Specimen Description BLOOD RIGHT ANTECUBITAL  Final   Special Requests   Final    BOTTLES DRAWN AEROBIC ONLY Blood Culture adequate volume   Culture   Final    NO GROWTH 2 DAYS Performed at [facility], [address] [place], [place] [address]    Report Status PENDING  Incomplete     Labs: Basic Metabolic Panel: Recent Labs  Lab 11/01/20 0416 11/02/20 0057 11/03/20 0123 11/04/20 0046 11/05/20 0252  NA 134* 136 136 136 136  K 4.1 3.5 3.7 3.5 3.4*  CL 106 109 109 109 108  CO2 19* 21* 19* 20* 21*  GLUCOSE 231* 83 97 126* 122*  BUN 33* 25* 25* 19 20  CREATININE 1.79* 1.71* 1.87* 1.73* 1.61*  CALCIUM 8.7* 8.3* 8.4* 8.2* 8.6*  MG  --   --   --  1.7 1.6*  PHOS  --   --   --  2.6 2.9   Liver Function Tests: Recent Labs  Lab 10/31/20 1754 11/01/20 0416 11/04/20 0046 11/05/20 0252  AST _0 37  ALT _1 ALKPHOS 107 78 112 107  BILITOT 0.4 0.3 0.3 0.2*  PROT 7.5 5.9* 5.9* 6.1*  ALBUMIN 3.1* 2.4* 2.2* 2.3*   No results for input(s): LIPASE, AMYLASE in the last 168 hours. No results for input(s): AMMONIA in the last 168 hours. CBC: Recent Labs  Lab 10/31/20 1754 11/01/20 0416 11/02/20 0057 11/03/20 0123 11/04/20 0046 11/05/20 0252  WBC 12.1* 11.7* 7.9 5.7 3.9* 3.8*  NEUTROABS 11.5*  --   --   --  2.5 2.0  HGB 10.0* 8.4* 7.8* 9.0* 8.1* 8.6*  HCT 31.0* 25.7* 24.2* 27.5* 25.3* 25.7*  MCV 94.8 94.1 92.0 93.2 92.0 89.5  PLT 202 159 134* 136* 135* 156   Cardiac Enzymes: No results for input(s): CKTOTAL, CKMB, CKMBINDEX, TROPONINI in the last 168 hours. BNP: BNP (last 3 results) No results for input(s): BNP in the last 8760 hours.  ProBNP (last 3 results) No results for input(s): PROBNP in the last 8760 hours.  CBG: Recent Labs  Lab 11/04/20 1257 11/04/20 1733 11/04/20 2102 11/05/20 0736 11/05/20 1143  GLUCAP 180* 108* 182*  125* 153*       Signed:  Fayrene Helper MD.  [facility] 11/05/2020, 10:22 PM

## 2020-11-08 LAB — CULTURE, BLOOD (ROUTINE X 2)
Culture: NO GROWTH
Culture: NO GROWTH
Special Requests: ADEQUATE
Special Requests: ADEQUATE

## 2020-11-27 ENCOUNTER — Other Ambulatory Visit (HOSPITAL_COMMUNITY): Payer: Self-pay | Admitting: Interventional Radiology

## 2020-11-27 DIAGNOSIS — N1330 Unspecified hydronephrosis: Secondary | ICD-10-CM

## 2020-12-04 ENCOUNTER — Other Ambulatory Visit (HOSPITAL_COMMUNITY): Payer: Self-pay | Admitting: Interventional Radiology

## 2020-12-04 ENCOUNTER — Other Ambulatory Visit: Payer: Self-pay

## 2020-12-04 ENCOUNTER — Ambulatory Visit (HOSPITAL_COMMUNITY)
Admission: RE | Admit: 2020-12-04 | Discharge: 2020-12-04 | Disposition: A | Discharge status: Home / Self Care | Payer: Medicaid Other | Source: Ambulatory Visit | Attending: Interventional Radiology | Admitting: Interventional Radiology

## 2020-12-04 DIAGNOSIS — N1330 Unspecified hydronephrosis: Secondary | ICD-10-CM

## 2020-12-04 DIAGNOSIS — Z436 Encounter for attention to other artificial openings of urinary tract: Secondary | ICD-10-CM | POA: Insufficient documentation

## 2020-12-04 HISTORY — PX: IR NEPHROSTOMY EXCHANGE LEFT: IMG6069

## 2020-12-04 HISTORY — PX: IR NEPHROSTOMY EXCHANGE RIGHT: IMG6070

## 2020-12-04 MED ORDER — IOHEXOL 240 MG/ML SOLN: 50.0000 mL | Freq: Once | INTRAMUSCULAR | Status: DC | PRN

## 2020-12-04 MED ORDER — LIDOCAINE HCL 1 % IJ SOLN
INTRAMUSCULAR | Status: AC
  Filled 2020-12-04: qty 20

## 2020-12-04 NOTE — Procedures (Signed)
  Procedure: Bilat perc neph exchange 65f EBL:   minimal Complications:  none immediate  See full dictation in [facility].  Dillard Cannon MD Main # [phone] Pager  [phone]

## 2021-01-01 ENCOUNTER — Other Ambulatory Visit: Payer: Self-pay

## 2021-01-01 ENCOUNTER — Other Ambulatory Visit (HOSPITAL_COMMUNITY): Payer: Self-pay | Admitting: Interventional Radiology

## 2021-01-01 ENCOUNTER — Ambulatory Visit (HOSPITAL_COMMUNITY)
Admission: RE | Admit: 2021-01-01 | Discharge: 2021-01-01 | Disposition: A | Discharge status: Home / Self Care | Payer: Medicaid Other | Source: Ambulatory Visit | Attending: Interventional Radiology | Admitting: Interventional Radiology

## 2021-01-01 DIAGNOSIS — Z436 Encounter for attention to other artificial openings of urinary tract: Secondary | ICD-10-CM | POA: Diagnosis present

## 2021-01-01 DIAGNOSIS — N1330 Unspecified hydronephrosis: Secondary | ICD-10-CM

## 2021-01-01 HISTORY — PX: IR NEPHROSTOMY EXCHANGE LEFT: IMG6069

## 2021-01-01 HISTORY — PX: IR NEPHROSTOMY EXCHANGE RIGHT: IMG6070

## 2021-01-01 MED ORDER — LIDOCAINE HCL 1 % IJ SOLN
INTRAMUSCULAR | Status: AC
  Filled 2021-01-01: qty 20

## 2021-01-01 MED ORDER — IOHEXOL 350 MG/ML SOLN
100.0000 mL | Freq: Once | INTRAVENOUS | Status: AC | PRN
  Administered 2021-01-01: 10 mL

## 2021-01-01 NOTE — Procedures (Signed)
Pre Procedure Dx: Hydronephrosis Post Procedure Dx: Same  Successful bilateral PCN exchange.    EBL: None   No immediate complications.   Jay Karlita Lichtman, MD Pager #: [phone]  

## 2021-02-04 ENCOUNTER — Telehealth (HOSPITAL_COMMUNITY): Payer: Self-pay

## 2021-02-04 NOTE — Telephone Encounter (Signed)
Called to reschedule tube change, no answer, left vm. AW

## 2021-02-12 ENCOUNTER — Ambulatory Visit (HOSPITAL_COMMUNITY)
Admission: RE | Admit: 2021-02-12 | Discharge: 2021-02-12 | Disposition: A | Discharge status: Home / Self Care | Payer: Medicaid Other | Source: Ambulatory Visit | Attending: Interventional Radiology | Admitting: Interventional Radiology

## 2021-02-12 ENCOUNTER — Other Ambulatory Visit: Payer: Self-pay

## 2021-02-12 ENCOUNTER — Other Ambulatory Visit (HOSPITAL_COMMUNITY): Payer: Self-pay | Admitting: Interventional Radiology

## 2021-02-12 ENCOUNTER — Other Ambulatory Visit (HOSPITAL_COMMUNITY): Payer: Self-pay | Admitting: Radiology

## 2021-02-12 DIAGNOSIS — N1339 Other hydronephrosis: Secondary | ICD-10-CM

## 2021-02-12 DIAGNOSIS — N1330 Unspecified hydronephrosis: Secondary | ICD-10-CM | POA: Diagnosis not present

## 2021-02-12 DIAGNOSIS — Z436 Encounter for attention to other artificial openings of urinary tract: Secondary | ICD-10-CM | POA: Insufficient documentation

## 2021-02-12 HISTORY — PX: IR NEPHROSTOMY EXCHANGE RIGHT: IMG6070

## 2021-02-12 HISTORY — PX: IR NEPHROSTOMY EXCHANGE LEFT: IMG6069

## 2021-02-12 MED ORDER — IOHEXOL 300 MG/ML  SOLN
100.0000 mL | Freq: Once | INTRAMUSCULAR | Status: AC | PRN
  Administered 2021-02-12: 15 mL

## 2021-02-12 MED ORDER — LIDOCAINE HCL 1 % IJ SOLN
INTRAMUSCULAR | Status: AC
  Filled 2021-02-12: qty 20

## 2021-02-12 NOTE — Procedures (Signed)
Interventional Radiology Procedure Note  Procedure: Exchange bilateral PCN, new 75F drains to gravity  Complications: None  Recommendations: - Routine drain care, routine exchanges - DC now  Signed,  Dulcy Fanny. Earleen Newport, DO

## 2021-02-26 ENCOUNTER — Telehealth: Payer: Self-pay | Admitting: *Deleted

## 2021-02-26 NOTE — Telephone Encounter (Signed)
Attempted to reach the patient's granddaughter to schedule an new patient appt with Dr Berline Lopes, left a message to her to call the office back.

## 2021-02-28 NOTE — Telephone Encounter (Signed)
Attempted to reach the patient's granddaughter to schedule a new patient appt with Dr Berline Lopes

## 2021-03-03 NOTE — Telephone Encounter (Signed)
Patient's granddaughter called back and scheduled a new patient appt for 12/19 at 9 am; given an arrival time of 8:30 am. Granddaughter given the address and phone number for the clinic, along with the policy for mask and visitors

## 2021-03-03 NOTE — Telephone Encounter (Signed)
Called and left the patient's granddaughter a message to call the office back

## 2021-03-07 ENCOUNTER — Encounter: Payer: Self-pay | Admitting: Gynecologic Oncology

## 2021-03-10 ENCOUNTER — Inpatient Hospital Stay: Payer: Medicaid Other | Attending: Gynecologic Oncology | Admitting: Gynecologic Oncology

## 2021-03-10 ENCOUNTER — Encounter: Payer: Self-pay | Admitting: Gynecologic Oncology

## 2021-03-10 ENCOUNTER — Other Ambulatory Visit: Payer: Self-pay

## 2021-03-10 VITALS — BP 137/75 | HR 73 | Temp 98.3°F | Resp 16 | Ht 62.0 in | Wt 103.8 lb

## 2021-03-10 DIAGNOSIS — L598 Other specified disorders of the skin and subcutaneous tissue related to radiation: Secondary | ICD-10-CM

## 2021-03-10 DIAGNOSIS — Z780 Asymptomatic menopausal state: Secondary | ICD-10-CM | POA: Insufficient documentation

## 2021-03-10 DIAGNOSIS — E119 Type 2 diabetes mellitus without complications: Secondary | ICD-10-CM | POA: Insufficient documentation

## 2021-03-10 DIAGNOSIS — N189 Chronic kidney disease, unspecified: Secondary | ICD-10-CM | POA: Insufficient documentation

## 2021-03-10 DIAGNOSIS — Z8541 Personal history of malignant neoplasm of cervix uteri: Secondary | ICD-10-CM | POA: Diagnosis not present

## 2021-03-10 DIAGNOSIS — Z923 Personal history of irradiation: Secondary | ICD-10-CM | POA: Insufficient documentation

## 2021-03-10 DIAGNOSIS — Z9221 Personal history of antineoplastic chemotherapy: Secondary | ICD-10-CM | POA: Insufficient documentation

## 2021-03-10 DIAGNOSIS — N183 Chronic kidney disease, stage 3 unspecified: Secondary | ICD-10-CM

## 2021-03-10 DIAGNOSIS — Z7984 Long term (current) use of oral hypoglycemic drugs: Secondary | ICD-10-CM | POA: Insufficient documentation

## 2021-03-10 DIAGNOSIS — Z79899 Other long term (current) drug therapy: Secondary | ICD-10-CM | POA: Insufficient documentation

## 2021-03-10 NOTE — Patient Instructions (Addendum)
It was a pleasure meeting you today. I will see you in January for an exam and pap test.

## 2021-03-10 NOTE — Progress Notes (Signed)
GYNECOLOGIC ONCOLOGY NEW PATIENT CONSULTATION   Patient Name: Heidi [name]  Patient Age: 80 y.o. Date of Service: 03/10/2021 Referring Provider: Alexis Frock, MD [address] [place],  [place] [address]   Primary Care Provider: Patient, No Pcp Per (Inactive) Consulting Provider: Jeral Pinch, MD   Assessment/Plan:  Postmenopausal patient with remote history of cervical cancer presenting to re-establish care.  Reviewed patient's history in detail with the patient and her grand daughter.   From a cancer standpoint, given almost 20 years since treatment, recommendation would be for continued yearly visits for surveillance. Any abnormal finding at this time would almost certainly be considered a new cancer, not a recurrence.  The patient prefers to return in January for a pelvic exam. Will plan to perform a pap test at that visit.    The patient continues to voice that, even in the setting of suspected recurrent or new malignancy, she would not be interested in treatment.   She will continue to undergo regular PCN exchanges with interventional radiology.   A copy of this note was sent to the patient's referring provider.   65 minutes of total time was spent for this patient encounter, including preparation, face-to-face counseling with the patient and coordination of care, and documentation of the encounter.  Jeral Pinch, MD  Division of Gynecologic Oncology  Department of Obstetrics and Gynecology  [facility]  ___________________________________________  Chief Complaint: Chief Complaint  Patient presents with   History of cervical cancer    History of Present Illness:  Heidi [name] is a 80 y.o. y.o. female who is seen in consultation at the request of Alexis Frock, MD for an evaluation of a remote history of cervical cancer.   Treatment history: She reports a remote (2003) history of cervical cancer that was  treated with primary chemoradiation at [facility] [place]. She had an ex lap as part of workup of her cancer, but hysterectomy was not performed. She had a complete response to primary therapy and did attend surveillance visits for many years and had no evidence of recurrence.   However, she developed abdominal pain and symptoms of urosepsis in April, 2017 and was admitted to [facility] on 07/20/15. Upon admission her creatinine was noted to be very elevated (3.37). She had findings of Klebsiella pneumoniae bacteriuria and was treated with antibiotics. She underwent imaging of the abdomen and pelvis (non contrasted CT) which showed: Bilateral hydronephrosis, moderate-to-severe in degree, favored to be chronic given the lack of significant perinephric inflammation. Poorly defined soft tissue mass/thickening within the pelvis, surrounding the bladder and within the bilateral adnexal regions, difficult to definitively characterize without intravascular contrast. This poorly defined soft tissue mass/thickening is the likely source of the bilateral obstructive uropathy. This may represent local spread of neoplastic mass related to the given history of endometrial cancer. Alternatively, this could represent postsurgical and/or post-treatment changes (scarring/fibrosis). As above, there is soft tissue mass/thickening circumferentially about the bladder. This may represent bladder wall thickening or an adjacent soft tissue encasement. Surgical clips are noted along the bilateral pelvic sidewalls. Previous tumor resection?   She underwent bilateral percutaneous nephrostomy tube placement on 07/23/15 and received a consultation with Urologist, Dr Tresa Moore. Her creatinine trended down after placement of the percutaneous nephrostomy tubes. On May 8th a pap was taken that was unremarkable and her pelvic exam revealed a shortened vagina with no apparent pelvic masses.   On 08/05/15 PET/CT was performed and revealed: minimal  hypermetabolism within left  periaortic and right inguinal lymph nodes, nonspecific and possibly reactive. Attention on followup exams is warranted. Mild vague hypermetabolism along the left vaginal cuff without a definite CT correlate. Findings may be treatment related.  In July, 2017 she was admitted to [facility] with urosepsis. A foley catheter was placed. Imaging of the abdomen and pelvis suggested right inguinal adenopathy which was palpable on exam. A biopsy of the right inguinal node revealed benign lymph node tissue.   A follow-up PET scan on 11/01/15 showed enlargement and increased hypermetabolism involving right inguinal nodes suspicious for progressive nodal mets and a persistent non-specific hypermetabolism in the left uterine wall.   On 10/17/15, biopsy of right inguinal lymph node was negative for viable disease.  No malignancy appreciated.  Bladder biopsy also done with ulcerated bladder mucosa, no atypia or malignancy noted.  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  Biopsy negative for recurrent cancer.  Last PCN exchange was approximately 3 weeks ago.  [name] and notes having control of this bladder function.  She denies episodes of incontinence, vaginal discharge, or vaginal bleeding.  She denies any abdominal or pelvic pain.  She reports normal bowel function.  She endorses having a good appetite.  Her granddaughter thinks that she has gained a little bit of weight.  Denies any nausea or emesis.  Patient has not had a pelvic exam since her last visit with [place] in 2017.  Patient spends her time in [place] in [place].  Her daughter [name] a  lives in [place].  She continues to split her time between here in [place].  Since her last visit, had hip surgery after she fell and broke her hip.  This occurred in [place].  She was briefly on insulin for about 3 months last year after returning from [place] and having uncontrolled blood glucoses.  She now is back on oral medications for her diabetes.  She does not check her CBGs routinely at home.  Denies any new medical issues.  Denies new medical problems among family members.  PAST MEDICAL HISTORY:  Past Medical History:  Diagnosis Date   Anemia    Chronic kidney disease    Diabetes mellitus without complication ([place])    Uterine cancer ([place])      PAST SURGICAL HISTORY:  Past Surgical History:  Procedure Laterality Date   CYSTOGRAM N/A 10/17/2015   Procedure: CYSTOGRAM;  Surgeon: Alexis Frock, MD;  Location: [facility];  Service: Urology;  Laterality: N/A;   CYSTOSCOPY WITH BIOPSY N/A 10/17/2015   Procedure: CYSTOSCOPY WITH BLADDER BIOPSY AND INGUINAL NODE BIOPSY WITH ULTRASOUND;  Surgeon: Alexis Frock, MD;  Location: [facility];  Service: Urology;  Laterality: N/A;   CYSTOSCOPY WITH FULGERATION N/A 10/17/2015   Procedure: CYSTOSCOPY WITH FULGERATION;  Surgeon: Alexis Frock, MD;  Location: [facility];  Service: Urology;  Laterality: N/A;   HIP SURGERY     IR GENERIC HISTORICAL  11/11/2015   IR NEPHROSTOMY EXCHANGE RIGHT 11/11/2015 Corrie Mckusick, DO [facility]   IR GENERIC HISTORICAL  11/11/2015   IR NEPHROSTOMY EXCHANGE LEFT 11/11/2015 Corrie Mckusick, DO [facility]   IR GENERIC HISTORICAL  01/13/2016   IR NEPHROSTOMY EXCHANGE LEFT 01/13/2016 Markus Daft, MD [facility]   IR [facility]  HISTORICAL  01/13/2016   IR NEPHROSTOMY EXCHANGE RIGHT 01/13/2016 Markus Daft, MD [facility]   IR GENERIC HISTORICAL  03/09/2016   IR NEPHROSTOMY EXCHANGE LEFT 03/09/2016 Marybelle Killings, MD [facility]   IR GENERIC HISTORICAL  03/09/2016   IR NEPHROSTOMY EXCHANGE RIGHT 03/09/2016 Marybelle Killings, MD  [facility]   IR GENERIC HISTORICAL  05/04/2016   IR NEPHROSTOMY EXCHANGE RIGHT 05/04/2016 Sandi Mariscal, MD [facility]   IR GENERIC HISTORICAL  05/04/2016   IR NEPHROSTOMY EXCHANGE LEFT 05/04/2016 Sandi Mariscal, MD [facility]   IR NEPHROSTOMY EXCHANGE LEFT  06/29/2016   IR NEPHROSTOMY EXCHANGE LEFT  07/03/2016   IR NEPHROSTOMY EXCHANGE LEFT  08/24/2016   IR NEPHROSTOMY EXCHANGE LEFT  10/28/2016   IR NEPHROSTOMY EXCHANGE LEFT  12/28/2016   IR NEPHROSTOMY EXCHANGE LEFT  04/05/2017   IR NEPHROSTOMY EXCHANGE LEFT  06/03/2017   IR NEPHROSTOMY EXCHANGE LEFT  08/03/2017   IR NEPHROSTOMY EXCHANGE LEFT  09/30/2017   IR NEPHROSTOMY EXCHANGE LEFT  11/25/2017   IR NEPHROSTOMY EXCHANGE LEFT  01/25/2018   IR NEPHROSTOMY EXCHANGE LEFT  03/07/2018   IR NEPHROSTOMY EXCHANGE LEFT  05/02/2018   IR NEPHROSTOMY EXCHANGE LEFT  08/10/2018   IR NEPHROSTOMY EXCHANGE LEFT  11/10/2018   IR NEPHROSTOMY EXCHANGE LEFT  02/02/2019   IR NEPHROSTOMY EXCHANGE LEFT  06/06/2019   IR NEPHROSTOMY EXCHANGE LEFT  02/01/2020   IR NEPHROSTOMY EXCHANGE LEFT  04/16/2020   IR NEPHROSTOMY EXCHANGE LEFT  05/28/2020   IR NEPHROSTOMY EXCHANGE LEFT  07/09/2020   IR NEPHROSTOMY EXCHANGE LEFT  08/20/2020   IR NEPHROSTOMY EXCHANGE LEFT  09/24/2020   IR NEPHROSTOMY EXCHANGE LEFT  11/03/2020   IR NEPHROSTOMY EXCHANGE LEFT  12/04/2020   IR NEPHROSTOMY EXCHANGE LEFT  01/01/2021   IR NEPHROSTOMY EXCHANGE LEFT  02/12/2021   IR NEPHROSTOMY EXCHANGE RIGHT  06/29/2016   IR NEPHROSTOMY EXCHANGE RIGHT  08/24/2016   IR NEPHROSTOMY EXCHANGE RIGHT  10/28/2016   IR NEPHROSTOMY EXCHANGE RIGHT  12/28/2016   IR NEPHROSTOMY EXCHANGE RIGHT  04/05/2017   IR NEPHROSTOMY EXCHANGE RIGHT  06/03/2017   IR NEPHROSTOMY EXCHANGE RIGHT  08/03/2017   IR NEPHROSTOMY EXCHANGE RIGHT  09/30/2017   IR NEPHROSTOMY EXCHANGE RIGHT  11/25/2017   IR NEPHROSTOMY EXCHANGE RIGHT  01/25/2018   IR NEPHROSTOMY EXCHANGE RIGHT  03/07/2018   IR NEPHROSTOMY EXCHANGE RIGHT   05/02/2018   IR NEPHROSTOMY EXCHANGE RIGHT  08/10/2018   IR NEPHROSTOMY EXCHANGE RIGHT  11/10/2018   IR NEPHROSTOMY EXCHANGE RIGHT  02/02/2019   IR NEPHROSTOMY EXCHANGE RIGHT  06/06/2019   IR NEPHROSTOMY EXCHANGE RIGHT  11/09/2019   IR NEPHROSTOMY EXCHANGE RIGHT  04/16/2020   IR NEPHROSTOMY EXCHANGE RIGHT  05/28/2020   IR NEPHROSTOMY EXCHANGE RIGHT  07/09/2020   IR NEPHROSTOMY EXCHANGE RIGHT  08/20/2020   IR NEPHROSTOMY EXCHANGE RIGHT  09/24/2020   IR NEPHROSTOMY EXCHANGE RIGHT  11/03/2020   IR NEPHROSTOMY EXCHANGE RIGHT  12/04/2020   IR NEPHROSTOMY EXCHANGE RIGHT  01/01/2021   IR NEPHROSTOMY EXCHANGE RIGHT  02/12/2021   IR NEPHROSTOMY PLACEMENT LEFT  02/08/2017   IR NEPHROSTOMY PLACEMENT RIGHT  02/08/2017    OB/GYN HISTORY:  OB History  Gravida Para Term Preterm AB Living  11 1          SAB IAB Ectopic Multiple Live Births               # Outcome Date GA Lbr Len/2nd Weight Sex Delivery Anes PTL Lv  11 Gravida  [place]           5 Gravida           4 Gravida           3 Gravida           2 Gravida           1 Para             No LMP recorded. (Menstrual status: Chemotherapy).  Hx of HRT: Denies Last pap: 07/2015-negative History of abnormal pap smears: Yes, see HPI  MEDICATIONS: Outpatient Encounter Medications as of 03/10/2021  Medication Sig   amLODipine (NORVASC) 2.5 MG tablet Take 1 tablet (2.5 mg total) by mouth daily.   Cholecalciferol (VITAMIN D3) 1.25 MG (50000 UT) CAPS Take 1 capsule by mouth once a week.   ferrous sulfate 325 (65 FE) MG tablet Take 1 tablet (325 mg total) by mouth daily.   glimepiride (AMARYL) 2 MG tablet Take 2 mg by mouth daily.   [DISCONTINUED] acetaminophen (TYLENOL) 500 MG tablet Take 500 mg by mouth every 6 (six) hours as needed for mild pain.   No facility-administered encounter medications on file as of 03/10/2021.    ALLERGIES:   Allergies  Allergen Reactions   Penicillins Itching, Nausea And Vomiting and Rash    Has patient had a PCN reaction causing immediate rash, facial/tongue/throat swelling, SOB or lightheadedness with hypotension: Yes Has patient had a PCN reaction causing severe rash involving mucus membranes or skin necrosis: No Has patient had a PCN reaction that required hospitalization No Has patient had a PCN reaction occurring within the last 10 years: No If all of the above answers are "NO", then may proceed with Cephalosporin use.      FAMILY HISTORY:  Family History  Problem Relation Age of Onset   Breast cancer Maternal Aunt    Uterine cancer Neg Hx    Colon cancer Neg Hx    Ovarian cancer Neg Hx    Endometrial cancer Neg Hx    Prostate cancer Neg Hx    Pancreatic cancer Neg Hx      SOCIAL HISTORY:  Social Connections: Not on file    REVIEW OF SYSTEMS:  Denies appetite changes, fevers, chills, fatigue, unexplained weight changes. Denies hearing loss, neck lumps or masses, mouth sores, ringing in ears or voice changes. Denies cough or wheezing.  Denies shortness of breath. Denies chest pain or palpitations. Denies leg swelling. Denies abdominal distention, pain, blood in stools, constipation, diarrhea, nausea, vomiting, or early satiety. Denies pain with intercourse, dysuria, frequency, hematuria or incontinence. Denies hot flashes, pelvic pain, vaginal bleeding or vaginal discharge.   Denies joint pain, back pain or muscle pain/cramps. Denies itching, rash, or wounds. Denies dizziness, headaches, numbness or seizures. Denies swollen lymph nodes or glands, denies easy bruising or bleeding. Denies anxiety, depression, confusion, or decreased concentration.  Physical Exam:  Vital Signs for this encounter:  Blood pressure 137/75, pulse 73, temperature 98.3 F (36.8 C), temperature source Tympanic, resp. rate 16, height 5\' 2"  (1.575 m), weight 103 lb 12.8 oz (47.1 kg), SpO2 100  %. Body mass index is 18.99 kg/m. General: Alert, oriented, no acute distress.  HEENT: Normocephalic, atraumatic.  Sclera anicteric.  Chest: Clear to auscultation bilaterally. No wheezes, rhonchi, or rales. Cardiovascular: Regular rate and rhythm, no murmurs, rubs, or gallops.  Back: PCN are intact with no surrounding erythema or exudate, suture to the skin.  No CVA tenderness. Abdomen: Normoactive bowel sounds. Soft, nondistended, nontender to palpation. No masses or hepatosplenomegaly appreciated. No palpable fluid wave.  Extremities: Grossly normal range of motion. Warm, well perfused. No edema bilaterally.  Skin: No rashes or lesions.  Lymphatics: No cervical, supraclavicular adenopathy.  GU: Deferred  LABORATORY AND RADIOLOGIC DATA:  Outside medical records were reviewed to synthesize the above history, along with the history and physical obtained during the visit.   Lab Results  Component Value Date   WBC 3.8 (L) 11/05/2020   HGB 8.6 (L) 11/05/2020   HCT 25.7 (L) 11/05/2020   PLT 156 11/05/2020   GLUCOSE 122 (H) 11/05/2020   ALT 14 11/05/2020   AST 37 11/05/2020   NA 136 11/05/2020   K 3.4 (L) 11/05/2020   CL 108 11/05/2020   CREATININE 1.61 (H) 11/05/2020   BUN 20 11/05/2020   CO2 21 (L) 11/05/2020   INR 1.4 (H) 11/01/2020

## 2021-03-13 ENCOUNTER — Ambulatory Visit (HOSPITAL_COMMUNITY)
Admission: RE | Admit: 2021-03-13 | Discharge: 2021-03-13 | Disposition: A | Discharge status: Home / Self Care | Payer: Medicaid Other | Source: Ambulatory Visit | Attending: Interventional Radiology | Admitting: Interventional Radiology

## 2021-03-13 ENCOUNTER — Other Ambulatory Visit (HOSPITAL_COMMUNITY): Payer: Self-pay | Admitting: Radiology

## 2021-03-13 ENCOUNTER — Other Ambulatory Visit: Payer: Self-pay

## 2021-03-13 DIAGNOSIS — N1339 Other hydronephrosis: Secondary | ICD-10-CM

## 2021-03-13 DIAGNOSIS — Z436 Encounter for attention to other artificial openings of urinary tract: Secondary | ICD-10-CM | POA: Diagnosis present

## 2021-03-13 HISTORY — PX: IR NEPHROSTOMY EXCHANGE LEFT: IMG6069

## 2021-03-13 HISTORY — PX: IR NEPHROSTOMY EXCHANGE RIGHT: IMG6070

## 2021-03-13 MED ORDER — LIDOCAINE HCL 1 % IJ SOLN
INTRAMUSCULAR | Status: AC
  Filled 2021-03-13: qty 20

## 2021-03-13 MED ORDER — LIDOCAINE HCL 1 % IJ SOLN
INTRAMUSCULAR | Status: DC | PRN
  Administered 2021-03-13: 10 mL via INTRADERMAL

## 2021-03-13 MED ORDER — IOHEXOL 300 MG/ML  SOLN
100.0000 mL | Freq: Once | INTRAMUSCULAR | Status: AC | PRN
  Administered 2021-03-13: 16:00:00 20 mL

## 2021-03-13 NOTE — Procedures (Signed)
Pre Procedure Dx: Hydronephrosis Post Procedure Dx: Same  Successful bilateral 12 Fr PCN exchange.    EBL: None No immediate complications.   Ronny Bacon, MD Pager #: [phone]

## 2021-04-11 ENCOUNTER — Inpatient Hospital Stay: Payer: Medicaid Other | Attending: Gynecologic Oncology | Admitting: Gynecologic Oncology

## 2021-04-11 ENCOUNTER — Other Ambulatory Visit: Payer: Self-pay

## 2021-04-11 ENCOUNTER — Encounter: Payer: Self-pay | Admitting: Gynecologic Oncology

## 2021-04-11 ENCOUNTER — Other Ambulatory Visit (HOSPITAL_COMMUNITY)
Admission: RE | Admit: 2021-04-11 | Discharge: 2021-04-11 | Disposition: A | Discharge status: Home / Self Care | Payer: Medicaid Other | Source: Ambulatory Visit | Attending: Gynecologic Oncology | Admitting: Gynecologic Oncology

## 2021-04-11 VITALS — BP 132/63 | HR 86 | Temp 97.6°F | Resp 16 | Ht 62.0 in | Wt 108.0 lb

## 2021-04-11 DIAGNOSIS — N189 Chronic kidney disease, unspecified: Secondary | ICD-10-CM | POA: Diagnosis not present

## 2021-04-11 DIAGNOSIS — Z8541 Personal history of malignant neoplasm of cervix uteri: Secondary | ICD-10-CM | POA: Diagnosis present

## 2021-04-11 DIAGNOSIS — Z9221 Personal history of antineoplastic chemotherapy: Secondary | ICD-10-CM | POA: Diagnosis not present

## 2021-04-11 DIAGNOSIS — L598 Other specified disorders of the skin and subcutaneous tissue related to radiation: Secondary | ICD-10-CM

## 2021-04-11 DIAGNOSIS — Z79899 Other long term (current) drug therapy: Secondary | ICD-10-CM | POA: Insufficient documentation

## 2021-04-11 DIAGNOSIS — N131 Hydronephrosis with ureteral stricture, not elsewhere classified: Secondary | ICD-10-CM | POA: Diagnosis not present

## 2021-04-11 DIAGNOSIS — Y842 Radiological procedure and radiotherapy as the cause of abnormal reaction of the patient, or of later complication, without mention of misadventure at the time of the procedure: Secondary | ICD-10-CM | POA: Diagnosis not present

## 2021-04-11 DIAGNOSIS — Z936 Other artificial openings of urinary tract status: Secondary | ICD-10-CM | POA: Insufficient documentation

## 2021-04-11 DIAGNOSIS — Z7984 Long term (current) use of oral hypoglycemic drugs: Secondary | ICD-10-CM | POA: Diagnosis not present

## 2021-04-11 DIAGNOSIS — Z923 Personal history of irradiation: Secondary | ICD-10-CM | POA: Diagnosis not present

## 2021-04-11 DIAGNOSIS — E1122 Type 2 diabetes mellitus with diabetic chronic kidney disease: Secondary | ICD-10-CM | POA: Diagnosis not present

## 2021-04-11 NOTE — Addendum Note (Signed)
Addended by: Joylene John D on: 04/11/2021 03:19 PM   Modules accepted: Orders

## 2021-04-11 NOTE — Patient Instructions (Signed)
I did not see or feel any evidence of cancer on your exam today.  I will call you when I have your Pap smear results back, likely in about a week.  My recommendation would be that you see a gynecologist every year for a pelvic exam and appointment given your cancer history.

## 2021-04-11 NOTE — Progress Notes (Signed)
Gynecologic Oncology Return Clinic Visit  04/11/2021  Reason for Visit: Pelvic exam and Pap test  Treatment History: She reports a remote (2003) history of cervical cancer that was treated with primary chemoradiation at [facility] [place]. She had an ex lap as part of workup of her cancer, but hysterectomy was not performed. She had a complete response to primary therapy and did attend surveillance visits for many years and had no evidence of recurrence.   However, she developed abdominal pain and symptoms of urosepsis in April, 2017 and was admitted to [facility] on 07/20/15. Upon admission her creatinine was noted to be very elevated (3.37). She had findings of Klebsiella pneumoniae bacteriuria and was treated with antibiotics. She underwent imaging of the abdomen and pelvis (non contrasted CT) which showed: Bilateral hydronephrosis, moderate-to-severe in degree, favored to be chronic given the lack of significant perinephric inflammation. Poorly defined soft tissue mass/thickening within the pelvis, surrounding the bladder and within the bilateral adnexal regions, difficult to definitively characterize without intravascular contrast. This poorly defined soft tissue mass/thickening is the likely source of the bilateral obstructive uropathy. This may represent local spread of neoplastic mass related to the given history of endometrial cancer. Alternatively, this could represent postsurgical and/or post-treatment changes (scarring/fibrosis). As above, there is soft tissue mass/thickening circumferentially about the bladder. This may represent bladder wall thickening or an adjacent soft tissue encasement. Surgical clips are noted along the bilateral pelvic sidewalls. Previous tumor resection?   She underwent bilateral percutaneous nephrostomy tube placement on 07/23/15 and received a consultation with Urologist, Dr Tresa Moore. Her creatinine trended down after placement of the percutaneous nephrostomy  tubes. On May 8th a pap was taken that was unremarkable and her pelvic exam revealed a shortened vagina with no apparent pelvic masses.   On 08/05/15 PET/CT was performed and revealed: minimal hypermetabolism within left periaortic and right inguinal lymph nodes, nonspecific and possibly reactive. Attention on followup exams is warranted. Mild vague hypermetabolism along the left vaginal cuff without a definite CT correlate. Findings may be treatment related.   In July, 2017 she was admitted to [facility] with urosepsis. A foley catheter was placed. Imaging of the abdomen and pelvis suggested right inguinal adenopathy which was palpable on exam. A biopsy of the right inguinal node revealed benign lymph node tissue.   A follow-up PET scan on 11/01/15 showed enlargement and increased hypermetabolism involving right inguinal nodes suspicious for progressive nodal mets and a persistent non-specific hypermetabolism in the left uterine wall.    On 10/17/15, biopsy of right inguinal lymph node was negative for viable disease.  No malignancy appreciated.  Bladder biopsy also done with ulcerated bladder mucosa, no atypia or malignancy noted.  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  Biopsy negative for recurrent cancer.   Last PCN exchange was approximately 3 weeks ago.  [name] and notes having control of this bladder function.  She denies episodes of incontinence, vaginal discharge, or vaginal bleeding.  She denies any abdominal or pelvic pain.  She reports normal bowel function.  She endorses having a good appetite.  Her granddaughter thinks that she has gained a little bit of weight.  Denies  any nausea or emesis.   [name].  Interval History: Presents today for an exam.  Continues to do well, denies any vaginal bleeding or discharge.  Denies any pelvic pain.  Reports regular bowel function.  Past Medical/Surgical History: Past Medical History:  Diagnosis Date   Anemia    Chronic kidney disease    Diabetes mellitus without complication ([place])    Uterine cancer [facility])     Past Surgical History:  Procedure Laterality Date   CYSTOGRAM N/A 10/17/2015   Procedure: CYSTOGRAM;  Surgeon: Alexis Frock, MD;  Location: [facility];  Service: Urology;  Laterality: N/A;   CYSTOSCOPY WITH BIOPSY N/A 10/17/2015   Procedure: CYSTOSCOPY WITH BLADDER BIOPSY AND INGUINAL NODE BIOPSY WITH ULTRASOUND;  Surgeon: Alexis Frock, MD;  Location: [facility];  Service: Urology;  Laterality: N/A;   CYSTOSCOPY WITH FULGERATION N/A 10/17/2015   Procedure: CYSTOSCOPY WITH FULGERATION;  Surgeon: Alexis Frock, MD;  Location: [facility];  Service: Urology;  Laterality: N/A;   HIP SURGERY     IR GENERIC HISTORICAL  11/11/2015   IR NEPHROSTOMY EXCHANGE RIGHT 11/11/2015 Corrie Mckusick, DO [facility]   IR GENERIC HISTORICAL  11/11/2015   IR NEPHROSTOMY EXCHANGE LEFT 11/11/2015 Corrie Mckusick, DO [facility]   IR GENERIC HISTORICAL  01/13/2016   IR NEPHROSTOMY EXCHANGE LEFT 01/13/2016 Markus Daft, MD [facility]   IR GENERIC HISTORICAL  01/13/2016   IR NEPHROSTOMY EXCHANGE RIGHT 01/13/2016 Markus Daft, MD [facility]   IR GENERIC HISTORICAL  03/09/2016   IR NEPHROSTOMY EXCHANGE LEFT 03/09/2016 Marybelle Killings, MD [facility]   IR GENERIC HISTORICAL  03/09/2016   IR NEPHROSTOMY EXCHANGE RIGHT 03/09/2016 Marybelle Killings, MD [facility]   IR GENERIC HISTORICAL  05/04/2016   IR NEPHROSTOMY EXCHANGE RIGHT 05/04/2016 Sandi Mariscal, MD [facility]   IR GENERIC HISTORICAL  05/04/2016   IR NEPHROSTOMY EXCHANGE LEFT 05/04/2016 Sandi Mariscal, MD [facility]   IR  NEPHROSTOMY EXCHANGE LEFT  06/29/2016   IR NEPHROSTOMY EXCHANGE LEFT  07/03/2016   IR NEPHROSTOMY EXCHANGE LEFT  08/24/2016   IR NEPHROSTOMY EXCHANGE LEFT  10/28/2016   IR NEPHROSTOMY EXCHANGE LEFT  12/28/2016   IR NEPHROSTOMY EXCHANGE LEFT  04/05/2017   IR NEPHROSTOMY EXCHANGE LEFT  06/03/2017   IR NEPHROSTOMY EXCHANGE LEFT  08/03/2017   IR NEPHROSTOMY EXCHANGE LEFT  09/30/2017   IR NEPHROSTOMY EXCHANGE LEFT  11/25/2017   IR NEPHROSTOMY EXCHANGE LEFT  01/25/2018   IR NEPHROSTOMY EXCHANGE LEFT  03/07/2018   IR NEPHROSTOMY EXCHANGE LEFT  05/02/2018   IR NEPHROSTOMY EXCHANGE LEFT  08/10/2018   IR NEPHROSTOMY EXCHANGE LEFT  11/10/2018   IR NEPHROSTOMY EXCHANGE LEFT  02/02/2019   IR NEPHROSTOMY EXCHANGE LEFT  06/06/2019   IR NEPHROSTOMY EXCHANGE LEFT  02/01/2020   IR NEPHROSTOMY EXCHANGE LEFT  04/16/2020   IR NEPHROSTOMY EXCHANGE LEFT  05/28/2020   IR NEPHROSTOMY EXCHANGE LEFT  07/09/2020   IR NEPHROSTOMY EXCHANGE LEFT  08/20/2020   IR NEPHROSTOMY EXCHANGE LEFT  09/24/2020   IR NEPHROSTOMY EXCHANGE LEFT  11/03/2020   IR NEPHROSTOMY EXCHANGE LEFT  12/04/2020   IR NEPHROSTOMY EXCHANGE LEFT  01/01/2021   IR NEPHROSTOMY EXCHANGE LEFT  02/12/2021   IR NEPHROSTOMY EXCHANGE LEFT  03/13/2021   IR NEPHROSTOMY EXCHANGE RIGHT  06/29/2016   IR NEPHROSTOMY EXCHANGE RIGHT  08/24/2016   IR NEPHROSTOMY EXCHANGE RIGHT  10/28/2016   IR NEPHROSTOMY EXCHANGE RIGHT  12/28/2016   IR NEPHROSTOMY EXCHANGE RIGHT  04/05/2017   IR NEPHROSTOMY EXCHANGE  RIGHT  06/03/2017   IR NEPHROSTOMY EXCHANGE RIGHT  08/03/2017   IR NEPHROSTOMY EXCHANGE RIGHT  09/30/2017   IR NEPHROSTOMY EXCHANGE RIGHT  11/25/2017   IR NEPHROSTOMY EXCHANGE RIGHT  01/25/2018   IR NEPHROSTOMY EXCHANGE RIGHT  03/07/2018   IR NEPHROSTOMY EXCHANGE RIGHT  05/02/2018   IR NEPHROSTOMY EXCHANGE RIGHT  08/10/2018   IR NEPHROSTOMY EXCHANGE RIGHT  11/10/2018   IR NEPHROSTOMY EXCHANGE RIGHT  02/02/2019   IR NEPHROSTOMY EXCHANGE RIGHT  06/06/2019    IR NEPHROSTOMY EXCHANGE RIGHT  11/09/2019   IR NEPHROSTOMY EXCHANGE RIGHT  04/16/2020   IR NEPHROSTOMY EXCHANGE RIGHT  05/28/2020   IR NEPHROSTOMY EXCHANGE RIGHT  07/09/2020   IR NEPHROSTOMY EXCHANGE RIGHT  08/20/2020   IR NEPHROSTOMY EXCHANGE RIGHT  09/24/2020   IR NEPHROSTOMY EXCHANGE RIGHT  11/03/2020   IR NEPHROSTOMY EXCHANGE RIGHT  12/04/2020   IR NEPHROSTOMY EXCHANGE RIGHT  01/01/2021   IR NEPHROSTOMY EXCHANGE RIGHT  02/12/2021   IR NEPHROSTOMY EXCHANGE RIGHT  03/13/2021   IR NEPHROSTOMY PLACEMENT LEFT  02/08/2017   IR NEPHROSTOMY PLACEMENT RIGHT  02/08/2017    Family History  Problem Relation Age of Onset   Breast cancer Maternal Aunt    Uterine cancer Neg Hx    Colon cancer Neg Hx    Ovarian cancer Neg Hx    Endometrial cancer Neg Hx    Prostate cancer Neg Hx    Pancreatic cancer Neg Hx     Social History   Socioeconomic History   Marital status: Widowed    Spouse name: Not on file   Number of children: Not on file   Years of education: Not on file   Highest education level: Not on file  Occupational History   Not on file  Tobacco Use   Smoking status: Never   Smokeless tobacco: Never  Vaping Use   Vaping Use: Never used  Substance and Sexual Activity   Alcohol use: No   Drug use: Never   Sexual activity: Not Currently  Other Topics Concern   Not on file  Social History Narrative   Not on file   Social Determinants of Health   Financial Resource Strain: Not on file  Food Insecurity: Not on file  Transportation Needs: Not on file  Physical Activity: Not on file  Stress: Not on file  Social Connections: Not on file    Current Medications:  Current Outpatient Medications:    Cholecalciferol (VITAMIN D3) 1.25 MG (50000 UT) CAPS, Take 1 capsule by mouth once a week., Disp: , Rfl:    ferrous sulfate 325 (65 FE) MG tablet, Take 1 tablet (325 mg total) by mouth daily., Disp: 30 tablet, Rfl: 3   glimepiride (AMARYL) 2 MG tablet, Take 2 mg by mouth  daily., Disp: , Rfl:    nitrofurantoin, macrocrystal-monohydrate, (MACROBID) 100 MG capsule, Take 100 mg by mouth 2 (two) times daily., Disp: , Rfl:    amLODipine (NORVASC) 2.5 MG tablet, Take 1 tablet (2.5 mg total) by mouth daily. (Patient not taking: Reported on 04/04/2021), Disp: 30 tablet, Rfl: 1  Review of Systems: Denies appetite changes, fevers, chills, fatigue, unexplained weight changes. Denies hearing loss, neck lumps or masses, mouth sores, ringing in ears or voice changes. Denies cough or wheezing.  Denies shortness of breath. Denies chest pain or palpitations. Denies leg swelling. Denies abdominal distention, pain, blood in stools, constipation, diarrhea, nausea, vomiting, or early satiety. Denies pain with intercourse, dysuria, frequency, hematuria or incontinence. Denies hot flashes, pelvic pain,  vaginal bleeding or vaginal discharge.   Denies joint pain, back pain or muscle pain/cramps. Denies itching, rash, or wounds. Denies dizziness, headaches, numbness or seizures. Denies swollen lymph nodes or glands, denies easy bruising or bleeding. Denies anxiety, depression, confusion, or decreased concentration.  Physical Exam: BP 132/63 (BP Location: Left Arm, Patient Position: Sitting)    Pulse 86    Temp 97.6 F (36.4 C) (Tympanic)    Resp 16    Ht 5\' 2"  (1.575 m)    Wt 108 lb (49 kg)    SpO2 100%    BMI 19.75 kg/m  General: Alert, oriented, no acute distress. HEENT: Normocephalic, atraumatic, sclera anicteric. Chest: Clear to auscultation bilaterally.  No wheezes or rhonchi. Cardiovascular: Regular rate and rhythm, no murmurs. Abdomen: soft, nontender.  Normoactive bowel sounds.  No masses or hepatosplenomegaly appreciated.  PCN tubes with bags attached to a waist belt. Extremities: Grossly normal range of motion.  Warm, well perfused.  No edema bilaterally. Skin: No rashes or lesions noted. Lymphatics: No cervical, supraclavicular, or inguinal adenopathy. GU: Normal  appearing external genitalia without erythema, excoriation, or lesions.  Speculum exam reveals significantly atrophic vaginal mucosa and foreshortened vagina, measuring only 2-3 cm.  Pap test and HPV collected.  Bimanual exam reveals shortened vagina, no nodularity or masses.  Rectovaginal exam confirms these findings.  Laboratory & Radiologic Studies: None new  Assessment & Plan: Heidi [name] is a 81 y.o. woman with remote history of cervical cancer presenting after recently reestablishing care for pelvic exam and Pap test today.    Patient continues to do well without symptoms.  Pap test and HPV collected today.  I will call her with test results.  We reviewed signs and symptoms that would be concerning for disease recurrence or new malignancy.  I recommended that they follow-up either with me or with a gynecologist yearly.   She will continue to undergo regular PCN exchanges with interventional radiology.   22 minutes of total time was spent for this patient encounter, including preparation, face-to-face counseling with the patient and coordination of care, and documentation of the encounter.  Jeral Pinch, MD  Division of Gynecologic Oncology  Department of Obstetrics and Gynecology  [facility]

## 2021-04-15 LAB — CYTOLOGY - PAP
Comment: NEGATIVE
Diagnosis: NEGATIVE
High risk HPV: NEGATIVE

## 2021-04-16 ENCOUNTER — Telehealth: Payer: Self-pay

## 2021-04-16 NOTE — Telephone Encounter (Signed)
Called patient and got in touch with the patients daughter using the interpreter services line.  Informed her that her moms PAP is normal and HPV high risk is negative.

## 2021-04-21 ENCOUNTER — Other Ambulatory Visit (HOSPITAL_COMMUNITY): Payer: Self-pay | Admitting: Radiology

## 2021-04-21 ENCOUNTER — Ambulatory Visit (HOSPITAL_COMMUNITY)
Admission: RE | Admit: 2021-04-21 | Discharge: 2021-04-21 | Disposition: A | Discharge status: Home / Self Care | Payer: Medicaid Other | Source: Ambulatory Visit | Attending: Interventional Radiology | Admitting: Interventional Radiology

## 2021-04-21 ENCOUNTER — Other Ambulatory Visit: Payer: Self-pay

## 2021-04-21 DIAGNOSIS — N1339 Other hydronephrosis: Secondary | ICD-10-CM

## 2021-04-21 DIAGNOSIS — Z436 Encounter for attention to other artificial openings of urinary tract: Secondary | ICD-10-CM | POA: Insufficient documentation

## 2021-04-21 HISTORY — PX: IR NEPHROSTOMY EXCHANGE LEFT: IMG6069

## 2021-04-21 MED ORDER — LIDOCAINE HCL 1 % IJ SOLN
INTRAMUSCULAR | Status: AC
  Administered 2021-04-21: 5 mL via SUBCUTANEOUS
  Filled 2021-04-21: qty 20

## 2021-04-21 MED ORDER — IOHEXOL 300 MG/ML  SOLN
100.0000 mL | Freq: Once | INTRAMUSCULAR | Status: AC | PRN
Start: 2021-04-21 — End: 2021-04-21
  Administered 2021-04-21: 10 mL

## 2021-04-22 ENCOUNTER — Other Ambulatory Visit (HOSPITAL_COMMUNITY): Payer: Self-pay | Admitting: Radiology

## 2021-04-22 ENCOUNTER — Encounter (HOSPITAL_COMMUNITY): Payer: Self-pay

## 2021-04-22 DIAGNOSIS — N1339 Other hydronephrosis: Secondary | ICD-10-CM

## 2021-04-22 HISTORY — PX: IR NEPHROSTOMY EXCHANGE RIGHT: IMG6070

## 2021-06-02 ENCOUNTER — Ambulatory Visit (HOSPITAL_COMMUNITY)
Admission: RE | Admit: 2021-06-02 | Discharge: 2021-06-02 | Disposition: A | Discharge status: Home / Self Care | Payer: Medicaid Other | Source: Ambulatory Visit | Attending: Interventional Radiology | Admitting: Interventional Radiology

## 2021-06-02 ENCOUNTER — Other Ambulatory Visit: Payer: Self-pay

## 2021-06-02 ENCOUNTER — Other Ambulatory Visit (HOSPITAL_COMMUNITY): Payer: Self-pay | Admitting: Interventional Radiology

## 2021-06-02 ENCOUNTER — Other Ambulatory Visit (HOSPITAL_COMMUNITY): Payer: Self-pay | Admitting: Radiology

## 2021-06-02 ENCOUNTER — Emergency Department (HOSPITAL_COMMUNITY)
Admission: EM | Admit: 2021-06-02 | Discharge: 2021-06-02 | Disposition: A | Discharge status: Home / Self Care | Payer: Medicaid Other | Attending: Student | Admitting: Student

## 2021-06-02 DIAGNOSIS — N189 Chronic kidney disease, unspecified: Secondary | ICD-10-CM | POA: Diagnosis not present

## 2021-06-02 DIAGNOSIS — N1339 Other hydronephrosis: Secondary | ICD-10-CM

## 2021-06-02 DIAGNOSIS — E1165 Type 2 diabetes mellitus with hyperglycemia: Secondary | ICD-10-CM | POA: Insufficient documentation

## 2021-06-02 DIAGNOSIS — E875 Hyperkalemia: Secondary | ICD-10-CM | POA: Diagnosis not present

## 2021-06-02 DIAGNOSIS — N1330 Unspecified hydronephrosis: Secondary | ICD-10-CM | POA: Insufficient documentation

## 2021-06-02 DIAGNOSIS — R7989 Other specified abnormal findings of blood chemistry: Secondary | ICD-10-CM | POA: Diagnosis present

## 2021-06-02 DIAGNOSIS — Z436 Encounter for attention to other artificial openings of urinary tract: Secondary | ICD-10-CM | POA: Diagnosis not present

## 2021-06-02 DIAGNOSIS — E1122 Type 2 diabetes mellitus with diabetic chronic kidney disease: Secondary | ICD-10-CM | POA: Diagnosis not present

## 2021-06-02 DIAGNOSIS — Z8541 Personal history of malignant neoplasm of cervix uteri: Secondary | ICD-10-CM | POA: Diagnosis not present

## 2021-06-02 DIAGNOSIS — D631 Anemia in chronic kidney disease: Secondary | ICD-10-CM | POA: Diagnosis not present

## 2021-06-02 HISTORY — PX: IR NEPHROSTOMY EXCHANGE RIGHT: IMG6070

## 2021-06-02 HISTORY — PX: IR NEPHROSTOMY EXCHANGE LEFT: IMG6069

## 2021-06-02 LAB — COMPREHENSIVE METABOLIC PANEL
ALT: 12 U/L (ref 0–44)
AST: 15 U/L (ref 15–41)
Albumin: 3.3 g/dL — ABNORMAL LOW (ref 3.5–5.0)
Alkaline Phosphatase: 115 U/L (ref 38–126)
Anion gap: 9 (ref 5–15)
BUN: 33 mg/dL — ABNORMAL HIGH (ref 8–23)
CO2: 19 mmol/L — ABNORMAL LOW (ref 22–32)
Calcium: 9.1 mg/dL (ref 8.9–10.3)
Chloride: 104 mmol/L (ref 98–111)
Creatinine, Ser: 1.73 mg/dL — ABNORMAL HIGH (ref 0.44–1.00)
GFR, Estimated: 29 mL/min — ABNORMAL LOW (ref >60)
Glucose, Bld: 213 mg/dL — ABNORMAL HIGH (ref 70–99)
Potassium: 4.1 mmol/L (ref 3.5–5.1)
Sodium: 132 mmol/L — ABNORMAL LOW (ref 135–145)
Total Bilirubin: 0.2 mg/dL — ABNORMAL LOW (ref 0.3–1.2)
Total Protein: 7.5 g/dL (ref 6.5–8.1)

## 2021-06-02 LAB — CBC WITH DIFFERENTIAL/PLATELET
Abs Immature Granulocytes: 0.02 10*3/uL (ref 0.00–0.07)
Basophils Absolute: 0 10*3/uL (ref 0.0–0.1)
Basophils Relative: 0 %
Eosinophils Absolute: 0.2 10*3/uL (ref 0.0–0.5)
Eosinophils Relative: 3 %
HCT: 33.4 % — ABNORMAL LOW (ref 36.0–46.0)
Hemoglobin: 10.7 g/dL — ABNORMAL LOW (ref 12.0–15.0)
Immature Granulocytes: 0 %
Lymphocytes Relative: 25 %
Lymphs Abs: 1.9 10*3/uL (ref 0.7–4.0)
MCH: 30.2 pg (ref 26.0–34.0)
MCHC: 32 g/dL (ref 30.0–36.0)
MCV: 94.4 fL (ref 80.0–100.0)
Monocytes Absolute: 0.5 10*3/uL (ref 0.1–1.0)
Monocytes Relative: 6 %
Neutro Abs: 4.9 10*3/uL (ref 1.7–7.7)
Neutrophils Relative %: 66 %
Platelets: 207 10*3/uL (ref 150–400)
RBC: 3.54 MIL/uL — ABNORMAL LOW (ref 3.87–5.11)
RDW: 13.6 % (ref 11.5–15.5)
WBC: 7.6 10*3/uL (ref 4.0–10.5)
nRBC: 0 % (ref 0.0–0.2)

## 2021-06-02 LAB — BETA-HYDROXYBUTYRIC ACID: Beta-Hydroxybutyric Acid: 0.07 mmol/L (ref 0.05–0.27)

## 2021-06-02 MED ORDER — LIDOCAINE HCL 1 % IJ SOLN
INTRAMUSCULAR | Status: AC
  Filled 2021-06-02: qty 20

## 2021-06-02 MED ORDER — LACTATED RINGERS IV BOLUS
1000.0000 mL | Freq: Once | INTRAVENOUS | Status: AC
  Administered 2021-06-02: 1000 mL via INTRAVENOUS

## 2021-06-02 MED ORDER — LIDOCAINE HCL (PF) 1 % IJ SOLN
INTRAMUSCULAR | Status: DC | PRN
  Administered 2021-06-02: 10 mL

## 2021-06-02 NOTE — Procedures (Signed)
Pre Procedure Dx: Hydronephrosis ?Post Procedure Dx: Same ? ?Successful bilateral PCN exchange.   ? ?EBL: None ?  ?No immediate complications.  ? ?Ronny Bacon, MD ?Pager #: [phone] ? bila ?

## 2021-06-02 NOTE — ED Provider Notes (Signed)
[place] EMERGENCY DEPARTMENT Provider Note  CSN: [id number] Arrival date & time: 06/02/21 1541  Chief Complaint(s) abnormal labs  HPI Heidi [name] is a 81 y.o. female with PMH CKD with known hydronephrosis, Foley in place, T2DM who presents the emergency department for evaluation of abnormal labs.  Patient received outpatient PCP labs on 05/30/2021 that showed hyperkalemia, hyperglycemia and worsening acute kidney injury.  These labs resulted today and she was instructed to come to the emergency department for evaluation.  Of note the patient is traveling out of the country and wants to make sure that these laboratory complications will not prevent her travel.  She denies chest pain, shortness of breath, abdominal pain, nausea, vomiting or other systemic symptoms.  HPI  Past Medical History Past Medical History:  Diagnosis Date   Anemia    Chronic kidney disease    Diabetes mellitus without complication ([place])    Uterine cancer [facility])    Patient Active Problem List   Diagnosis Date Noted   Lymphadenopathy, inguinal 11/06/2015   Acute renal failure superimposed on stage 3 chronic kidney disease (HCC)    Moderate protein-calorie malnutrition ([place])    Complicated UTI (urinary tract infection) 10/11/2015   Sepsis due to urinary tract infection ([place]) 10/11/2015   Renal insufficiency 10/11/2015   Hyponatremia 10/11/2015   Hypoalbuminemia 10/11/2015   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 Authorizing  Provider  amLODipine (NORVASC) 2.5 MG tablet Take 1 tablet (2.5 mg total) by mouth daily. Patient not taking: Reported on 04/04/2021 10/18/15   Barton Dubois, MD  Cholecalciferol (VITAMIN D3) 1.25 MG (50000 UT) CAPS Take 1 capsule by mouth once a week. 07/16/20   [provider]  ferrous sulfate 325 (65 FE) MG tablet Take 1 tablet (325 mg total) by mouth daily. 11/05/20 11/05/21  Elodia Florence., MD  glimepiride (AMARYL) 2 MG tablet Take 2 mg by mouth daily. 10/21/20   [provider]  nitrofurantoin, macrocrystal-monohydrate, (MACROBID) 100 MG capsule Take 100 mg by mouth 2 (two) times daily. 02/27/21   [provider]                                                                                                                                    Past Surgical History Past Surgical History:  Procedure Laterality Date   CYSTOGRAM N/A 10/17/2015   Procedure: CYSTOGRAM;  Surgeon: Alexis Frock, MD;  Location: [facility];  Service: Urology;  Laterality: N/A;   CYSTOSCOPY WITH BIOPSY N/A 10/17/2015   Procedure: CYSTOSCOPY WITH BLADDER BIOPSY AND INGUINAL NODE BIOPSY WITH ULTRASOUND;  Surgeon: Alexis Frock, MD;  Location: [facility];  Service: Urology;  Laterality: N/A;   CYSTOSCOPY WITH FULGERATION N/A 10/17/2015   Procedure: CYSTOSCOPY WITH FULGERATION;  Surgeon: Alexis Frock, MD;  Location: [facility];  Service: Urology;  Laterality: N/A;   HIP SURGERY     IR GENERIC HISTORICAL  11/11/2015   IR NEPHROSTOMY EXCHANGE RIGHT 11/11/2015 Corrie Mckusick, DO [facility]   IR GENERIC HISTORICAL  11/11/2015   IR NEPHROSTOMY EXCHANGE LEFT 11/11/2015 Corrie Mckusick, DO [facility]   IR GENERIC HISTORICAL  01/13/2016   IR NEPHROSTOMY EXCHANGE LEFT 01/13/2016 Markus Daft, MD [facility]   IR GENERIC HISTORICAL  01/13/2016   IR NEPHROSTOMY EXCHANGE RIGHT 01/13/2016 Markus Daft, MD [facility]   IR GENERIC HISTORICAL  03/09/2016   IR NEPHROSTOMY EXCHANGE LEFT 03/09/2016 Marybelle Killings, MD  [facility]   IR GENERIC HISTORICAL  03/09/2016   IR NEPHROSTOMY EXCHANGE RIGHT 03/09/2016 Marybelle Killings, MD [facility]   IR GENERIC HISTORICAL  05/04/2016   IR NEPHROSTOMY EXCHANGE RIGHT 05/04/2016 Sandi Mariscal, MD [facility]   IR GENERIC HISTORICAL  05/04/2016   IR NEPHROSTOMY EXCHANGE LEFT 05/04/2016 Sandi Mariscal, MD [facility]   IR NEPHROSTOMY EXCHANGE LEFT  06/29/2016   IR NEPHROSTOMY EXCHANGE LEFT  07/03/2016   IR NEPHROSTOMY EXCHANGE LEFT  08/24/2016   IR NEPHROSTOMY EXCHANGE LEFT  10/28/2016   IR NEPHROSTOMY EXCHANGE LEFT  12/28/2016   IR NEPHROSTOMY EXCHANGE LEFT  04/05/2017   IR NEPHROSTOMY EXCHANGE LEFT  06/03/2017   IR NEPHROSTOMY EXCHANGE LEFT  08/03/2017   IR NEPHROSTOMY EXCHANGE LEFT  09/30/2017   IR NEPHROSTOMY EXCHANGE LEFT  11/25/2017   IR NEPHROSTOMY EXCHANGE LEFT  01/25/2018   IR NEPHROSTOMY EXCHANGE LEFT  03/07/2018   IR NEPHROSTOMY EXCHANGE LEFT  05/02/2018   IR NEPHROSTOMY EXCHANGE LEFT  08/10/2018   IR NEPHROSTOMY EXCHANGE LEFT  11/10/2018   IR NEPHROSTOMY EXCHANGE LEFT  02/02/2019   IR NEPHROSTOMY EXCHANGE LEFT  06/06/2019   IR NEPHROSTOMY EXCHANGE LEFT  02/01/2020   IR NEPHROSTOMY EXCHANGE LEFT  04/16/2020   IR NEPHROSTOMY EXCHANGE LEFT  05/28/2020   IR NEPHROSTOMY EXCHANGE LEFT  07/09/2020   IR NEPHROSTOMY EXCHANGE LEFT  08/20/2020   IR NEPHROSTOMY EXCHANGE LEFT  09/24/2020   IR NEPHROSTOMY EXCHANGE LEFT  11/03/2020   IR NEPHROSTOMY EXCHANGE LEFT  12/04/2020   IR NEPHROSTOMY EXCHANGE LEFT  01/01/2021   IR NEPHROSTOMY EXCHANGE LEFT  02/12/2021   IR NEPHROSTOMY EXCHANGE LEFT  03/13/2021   IR NEPHROSTOMY EXCHANGE LEFT  04/21/2021   IR NEPHROSTOMY EXCHANGE LEFT  06/02/2021   IR NEPHROSTOMY EXCHANGE RIGHT  06/29/2016   IR NEPHROSTOMY EXCHANGE RIGHT  08/24/2016   IR NEPHROSTOMY EXCHANGE RIGHT  10/28/2016   IR NEPHROSTOMY EXCHANGE RIGHT  12/28/2016   IR NEPHROSTOMY EXCHANGE RIGHT  04/05/2017   IR NEPHROSTOMY EXCHANGE RIGHT  06/03/2017   IR NEPHROSTOMY  EXCHANGE RIGHT  08/03/2017   IR NEPHROSTOMY EXCHANGE RIGHT  09/30/2017   IR NEPHROSTOMY EXCHANGE RIGHT  11/25/2017   IR NEPHROSTOMY EXCHANGE RIGHT  01/25/2018   IR NEPHROSTOMY EXCHANGE RIGHT  03/07/2018   IR NEPHROSTOMY EXCHANGE RIGHT  05/02/2018   IR NEPHROSTOMY EXCHANGE RIGHT  08/10/2018   IR NEPHROSTOMY EXCHANGE RIGHT  11/10/2018   IR NEPHROSTOMY EXCHANGE RIGHT  02/02/2019   IR NEPHROSTOMY EXCHANGE RIGHT  06/06/2019   IR NEPHROSTOMY EXCHANGE RIGHT  11/09/2019   IR NEPHROSTOMY EXCHANGE RIGHT  04/16/2020   IR NEPHROSTOMY EXCHANGE RIGHT  05/28/2020   IR NEPHROSTOMY EXCHANGE RIGHT  07/09/2020   IR NEPHROSTOMY EXCHANGE RIGHT  08/20/2020   IR NEPHROSTOMY EXCHANGE RIGHT  09/24/2020   IR NEPHROSTOMY EXCHANGE RIGHT  11/03/2020   IR NEPHROSTOMY EXCHANGE RIGHT  12/04/2020   IR NEPHROSTOMY EXCHANGE RIGHT  01/01/2021   IR NEPHROSTOMY EXCHANGE RIGHT  02/12/2021   IR NEPHROSTOMY EXCHANGE RIGHT  03/13/2021   IR NEPHROSTOMY EXCHANGE RIGHT  04/22/2021   IR NEPHROSTOMY EXCHANGE RIGHT  06/02/2021   IR NEPHROSTOMY PLACEMENT LEFT  02/08/2017   IR NEPHROSTOMY PLACEMENT RIGHT  02/08/2017   Family History Family History  Problem Relation Age of Onset   Breast cancer Maternal Aunt    Uterine cancer Neg Hx    Colon cancer Neg Hx    Ovarian cancer Neg Hx    Endometrial cancer Neg Hx    Prostate cancer Neg Hx    Pancreatic cancer Neg Hx     Social History Social History   Tobacco Use   Smoking status: Never   Smokeless tobacco: Never  Vaping Use   Vaping Use: Never used  Substance Use Topics   Alcohol use: No   Drug use: Never   Allergies Penicillins  Review of Systems Review of Systems  All other systems reviewed and are negative.  Physical Exam Vital Signs  I have reviewed the triage vital signs BP (!) 143/61 (BP Location: Left Arm)    Pulse 79    Temp 98.2 F (36.8 C) (Oral)    Resp 16    SpO2 100%   Physical Exam Vitals and nursing note reviewed.  Constitutional:       General: She is not in acute distress.    Appearance: She is well-developed.  HENT:     Head: Normocephalic and atraumatic.  Eyes:     Conjunctiva/sclera: Conjunctivae normal.  Cardiovascular:     Rate and Rhythm: Normal rate and regular rhythm.     Heart sounds: No murmur heard. Pulmonary:     Effort: Pulmonary effort is normal. No respiratory distress.     Breath sounds: Normal breath sounds.  Abdominal:     Palpations: Abdomen is soft.     Tenderness: There is no abdominal tenderness.  Musculoskeletal:        General: No swelling.     Cervical back: Neck supple.  Skin:    General: Skin is warm and dry.     Capillary Refill: Capillary refill takes less than 2 seconds.  Neurological:     Mental Status: She is alert.  Psychiatric:        Mood and Affect: Mood normal.    ED Results and Treatments Labs (all labs ordered are listed, but only abnormal results are displayed) Labs Reviewed  COMPREHENSIVE METABOLIC PANEL  CBC WITH DIFFERENTIAL/PLATELET  Radiology IR NEPHROSTOMY EXCHANGE LEFT  Result Date: 06/02/2021 INDICATION: Routine exchange EXAM: FLUOROSCOPIC GUIDED BILATERAL SIDED NEPHROSTOMY CATHETER EXCHANGE COMPARISON:  Multiple previous fluoroscopic guided exchanges, most recently on 04/21/2021 CONTRAST:  A total of 15 cc Omnipaque 300 administered was administered into both collecting systems FLUOROSCOPY TIME:  48 seconds (5 mGy) COMPLICATIONS: None immediate. TECHNIQUE: Informed written consent was obtained from the patient after a discussion of the risks, benefits and alternatives to treatment. Questions regarding the procedure were encouraged and answered. A timeout was performed prior to the initiation of the procedure. The bilateral flanks and external portions of existing nephrostomy catheters were prepped and draped in the usual sterile fashion. A  sterile drape was applied covering the operative field. Maximum barrier sterile technique with sterile gowns and gloves were used for the procedure. A timeout was performed prior to the initiation of the procedure. A pre procedural spot fluoroscopic image was obtained. Beginning with the left-sided nephrostomy, a small amount of contrast was injected via the existing left-sided nephrostomy catheter demonstrating appropriate positioning within the renal pelvis. The existing nephrostomy catheter was cut and cannulated with a Benson wire which was coiled within the renal pelvis. Under intermittent fluoroscopic guidance, the existing nephrostomy catheter was exchanged for a new 12 [place] all-purpose drainage catheter. Limited contrast injection confirmed appropriate positioning within the left renal pelvis and a post exchange fluoroscopic image was obtained. The catheter was locked, secured to the skin with an interrupted suture and reconnected to a gravity bag. The identical repeat procedure was repeated for the contralateral right-sided nephrostomy, ultimately allowing successful exchange of a new 12 [place] all-purpose drainage catheter with end coiled and locked within the right renal pelvis. Dressings were applied. The patient tolerated the above procedures well without immediate postprocedural complication. FINDINGS: The existing nephrostomy catheters are appropriately positioned and functioning. After successful fluoroscopic guided exchange, new bilateral 12 French nephrostomy catheters are coiled and locked within the respective renal pelvises. IMPRESSION: Successful fluoroscopic guided exchange of bilateral 12 French percutaneous nephrostomy catheters. Electronically Signed   By: Sandi Mariscal M.D.   On: 06/02/2021 15:23   IR NEPHROSTOMY EXCHANGE RIGHT  Result Date: 06/02/2021 INDICATION: Routine exchange EXAM: FLUOROSCOPIC GUIDED BILATERAL SIDED NEPHROSTOMY CATHETER EXCHANGE COMPARISON:  Multiple previous  fluoroscopic guided exchanges, most recently on 04/21/2021 CONTRAST:  A total of 15 cc Omnipaque 300 administered was administered into both collecting systems FLUOROSCOPY TIME:  48 seconds (5 mGy) COMPLICATIONS: None immediate. TECHNIQUE: Informed written consent was obtained from the patient after a discussion of the risks, benefits and alternatives to treatment. Questions regarding the procedure were encouraged and answered. A timeout was performed prior to the initiation of the procedure. The bilateral flanks and external portions of existing nephrostomy catheters were prepped and draped in the usual sterile fashion. A sterile drape was applied covering the operative field. Maximum barrier sterile technique with sterile gowns and gloves were used for the procedure. A timeout was performed prior to the initiation of the procedure. A pre procedural spot fluoroscopic image was obtained. Beginning with the left-sided nephrostomy, a small amount of contrast was injected via the existing left-sided nephrostomy catheter demonstrating appropriate positioning within the renal pelvis. The existing nephrostomy catheter was cut and cannulated with a Benson wire which was coiled within the renal pelvis. Under intermittent fluoroscopic guidance, the existing nephrostomy catheter was exchanged for a new 12 [place] all-purpose drainage catheter. Limited contrast injection confirmed appropriate positioning within the left renal pelvis and a post exchange fluoroscopic image was obtained.  The catheter was locked, secured to the skin with an interrupted suture and reconnected to a gravity bag. The identical repeat procedure was repeated for the contralateral right-sided nephrostomy, ultimately allowing successful exchange of a new 12 [place] all-purpose drainage catheter with end coiled and locked within the right renal pelvis. Dressings were applied. The patient tolerated the above procedures well without immediate postprocedural  complication. FINDINGS: The existing nephrostomy catheters are appropriately positioned and functioning. After successful fluoroscopic guided exchange, new bilateral 12 French nephrostomy catheters are coiled and locked within the respective renal pelvises. IMPRESSION: Successful fluoroscopic guided exchange of bilateral 12 French percutaneous nephrostomy catheters. Electronically Signed   By: Sandi Mariscal M.D.   On: 06/02/2021 15:23    Pertinent labs & imaging results that were available during my care of the patient were reviewed by me and considered in my medical decision making (see MDM for details).  Medications Ordered in ED Medications - No data to display                                                                                                                                   Procedures Procedures  (including critical care time)  Medical Decision Making / ED Course   This patient presents to the ED for concern of abnormal labs, this involves an extensive number of treatment options, and is a complaint that carries with it a high risk of complications and morbidity.  The differential diagnosis includes hyperglycemia, DKA, renal failure, obstructive uropathy  MDM: Patient seen emergency room for abnormal labs.  Physical exam is unremarkable.  Laboratory evaluation with a hemoglobin of 10.7 which is near patient's baseline, hyperkalemia has resolved and potassium is 4.1, glucose is 213 and creatinine 1.73 which is patient's baseline.  Patient given 1 L lactated Ringer's for elevated creatinine and hyperglycemia but as the previously discussed abnormal labs have appeared to have resolved patient is safe for discharge with outpatient PCP follow-up.   Additional history obtained: -Additional history obtained from daughter -External records from outside source obtained and reviewed including: Chart review including previous notes, labs, imaging, consultation notes   Lab Tests: -I  ordered, reviewed, and interpreted labs.   The pertinent results include:   Labs Reviewed  COMPREHENSIVE METABOLIC PANEL  CBC WITH DIFFERENTIAL/PLATELET      EKG   EKG Interpretation  Date/Time:  Monday June 02 2021 16:59:21 EDT Ventricular Rate:  80 PR Interval:  170 QRS Duration: 98 QT Interval:  386 QTC Calculation: 445 R Axis:   -27 Text Interpretation: Normal sinus rhythm When compared with ECG of 31-Oct-2020 17:10, PREVIOUS ECG IS PRESENT Confirmed by Seymour Pavlak ([id number]) on 06/02/2021 6:07:49 PM          Medicines ordered and prescription drug management: No orders of the defined types were placed in this encounter.   -I have reviewed the patients home medicines and have made adjustments as needed  Critical  interventions none  Cardiac Monitoring: The patient was maintained on a cardiac monitor.  I personally viewed and interpreted the cardiac monitored which showed an underlying rhythm of: NSR  Social Determinants of Health:  Factors impacting patients care include: Spanish-speaking   Reevaluation: After the interventions noted above, I reevaluated the patient and found that they have :stayed the same  Co morbidities that complicate the patient evaluation  Past Medical History:  Diagnosis Date   Anemia    Chronic kidney disease    Diabetes mellitus without complication ([place])    Uterine cancer ([place])       Dispostion: I considered admission for this patient, but as her laboratory abnormalities appear to have resolved she is safe for discharge with outpatient follow-up     Final Clinical Impression(s) / ED Diagnoses Final diagnoses:  None     '@PCDICTATION'$ @    Teressa Lower, MD 06/02/21 2354

## 2021-06-02 NOTE — ED Triage Notes (Signed)
Pt w bloodwork Friday, here for eval of hyperglycemia (409) & hyperkalemia (5.5), decreased kidney function (BUN/creat ratio 21). Pt denies any associated symptoms, but family is concerned as pt is due to fly to [place] tomorrow. Hx hydronephrosis, nephrostomy tubes & foley catheter in place ?

## 2021-06-02 NOTE — ED Provider Triage Note (Signed)
Emergency Medicine Provider Triage Evaluation Note ? ?Heidi [name] , a 81 y.o. female  was evaluated in triage.  Pt complains of abnormal labs. Fam at bedside states pt had hyperkalemia, aki, and hyperglycemia. ? ?Review of Systems  ?Positive: N/a ?Negative: fever ? ?Physical Exam  ?BP (!) 143/61 (BP Location: Left Arm)   Pulse 79   Temp 98.2 ?F (36.8 ?C) (Oral)   Resp 16   SpO2 100%  ?Gen:   Awake, no distress   ?Resp:  Normal effort  ?MSK:   Moves extremities without difficulty  ?Other:   ? ?Medical Decision Making  ?Medically screening exam initiated at 4:46 PM.  Appropriate orders placed.  Heidi [name] was informed that the remainder of the evaluation will be completed by another provider, this initial triage assessment does not replace that evaluation, and the importance of remaining in the ED until their evaluation is complete. ? ? ?  ?Rodney Booze, PA-C ?06/02/21 1647 ? ?

## 2021-07-14 ENCOUNTER — Other Ambulatory Visit (HOSPITAL_COMMUNITY): Payer: Self-pay | Admitting: Interventional Radiology

## 2021-07-14 ENCOUNTER — Ambulatory Visit (HOSPITAL_COMMUNITY)
Admission: RE | Admit: 2021-07-14 | Discharge: 2021-07-14 | Disposition: A | Discharge status: Home / Self Care | Payer: Medicaid Other | Source: Ambulatory Visit | Attending: Interventional Radiology | Admitting: Interventional Radiology

## 2021-07-14 DIAGNOSIS — N1330 Unspecified hydronephrosis: Secondary | ICD-10-CM

## 2021-07-14 DIAGNOSIS — Z436 Encounter for attention to other artificial openings of urinary tract: Secondary | ICD-10-CM | POA: Insufficient documentation

## 2021-07-14 HISTORY — PX: IR NEPHROSTOMY EXCHANGE LEFT: IMG6069

## 2021-07-14 HISTORY — PX: IR NEPHROSTOMY EXCHANGE RIGHT: IMG6070

## 2021-07-14 MED ORDER — LIDOCAINE HCL 1 % IJ SOLN
INTRAMUSCULAR | Status: AC
  Filled 2021-07-14: qty 20

## 2021-07-14 MED ORDER — LIDOCAINE HCL (PF) 1 % IJ SOLN
INTRAMUSCULAR | Status: DC | PRN
  Administered 2021-07-14: 10 mL

## 2021-07-14 MED ORDER — IOHEXOL 300 MG/ML  SOLN
100.0000 mL | Freq: Once | INTRAMUSCULAR | Status: AC | PRN
  Administered 2021-07-14: 15 mL

## 2021-08-25 ENCOUNTER — Other Ambulatory Visit (HOSPITAL_COMMUNITY): Payer: Self-pay | Admitting: Interventional Radiology

## 2021-08-25 ENCOUNTER — Ambulatory Visit (HOSPITAL_COMMUNITY)
Admission: RE | Admit: 2021-08-25 | Discharge: 2021-08-25 | Disposition: A | Discharge status: Home / Self Care | Payer: Medicaid Other | Source: Ambulatory Visit | Attending: Interventional Radiology | Admitting: Interventional Radiology

## 2021-08-25 DIAGNOSIS — Z436 Encounter for attention to other artificial openings of urinary tract: Secondary | ICD-10-CM | POA: Diagnosis present

## 2021-08-25 DIAGNOSIS — N1330 Unspecified hydronephrosis: Secondary | ICD-10-CM | POA: Diagnosis not present

## 2021-08-25 HISTORY — PX: IR NEPHROSTOMY EXCHANGE RIGHT: IMG6070

## 2021-08-25 HISTORY — PX: IR NEPHROSTOMY EXCHANGE LEFT: IMG6069

## 2021-08-25 MED ORDER — LIDOCAINE HCL 1 % IJ SOLN
INTRAMUSCULAR | Status: AC
  Filled 2021-08-25: qty 20

## 2021-08-25 MED ORDER — IOHEXOL 300 MG/ML  SOLN
100.0000 mL | Freq: Once | INTRAMUSCULAR | Status: AC | PRN
  Administered 2021-08-25: 20 mL

## 2021-08-25 NOTE — Procedures (Signed)
  Procedure:  Exchange bilat perc nephrostomy catheters 82fPreprocedure diagnosis: The encounter diagnosis was Hydronephrosis, unspecified hydronephrosis type.  Postprocedure diagnosis: same EBL:    minimal Complications:   none immediate  See full dictation in [facility]  DDillard CannonMD Main # [phone]  [phone] [phone]

## 2021-09-09 ENCOUNTER — Emergency Department (HOSPITAL_COMMUNITY): Payer: Medicaid Other

## 2021-09-09 ENCOUNTER — Inpatient Hospital Stay (HOSPITAL_COMMUNITY)
Admission: EM | Admit: 2021-09-09 | Discharge: 2021-09-12 | DRG: 683 | Disposition: A | Discharge status: Home / Self Care | Payer: Medicaid Other | Attending: Family Medicine | Admitting: Family Medicine

## 2021-09-09 ENCOUNTER — Encounter (HOSPITAL_COMMUNITY): Payer: Self-pay | Admitting: Emergency Medicine

## 2021-09-09 ENCOUNTER — Other Ambulatory Visit: Payer: Self-pay

## 2021-09-09 DIAGNOSIS — N390 Urinary tract infection, site not specified: Secondary | ICD-10-CM | POA: Diagnosis present

## 2021-09-09 DIAGNOSIS — N184 Chronic kidney disease, stage 4 (severe): Secondary | ICD-10-CM | POA: Diagnosis present

## 2021-09-09 DIAGNOSIS — Z8541 Personal history of malignant neoplasm of cervix uteri: Secondary | ICD-10-CM

## 2021-09-09 DIAGNOSIS — Z79899 Other long term (current) drug therapy: Secondary | ICD-10-CM

## 2021-09-09 DIAGNOSIS — N179 Acute kidney failure, unspecified: Principal | ICD-10-CM | POA: Insufficient documentation

## 2021-09-09 DIAGNOSIS — Y92012 Bathroom of single-family (private) house as the place of occurrence of the external cause: Secondary | ICD-10-CM

## 2021-09-09 DIAGNOSIS — E1122 Type 2 diabetes mellitus with diabetic chronic kidney disease: Secondary | ICD-10-CM | POA: Diagnosis present

## 2021-09-09 DIAGNOSIS — I129 Hypertensive chronic kidney disease with stage 1 through stage 4 chronic kidney disease, or unspecified chronic kidney disease: Secondary | ICD-10-CM | POA: Diagnosis present

## 2021-09-09 DIAGNOSIS — R55 Syncope and collapse: Secondary | ICD-10-CM

## 2021-09-09 DIAGNOSIS — Z7984 Long term (current) use of oral hypoglycemic drugs: Secondary | ICD-10-CM

## 2021-09-09 DIAGNOSIS — A084 Viral intestinal infection, unspecified: Secondary | ICD-10-CM | POA: Diagnosis present

## 2021-09-09 DIAGNOSIS — R197 Diarrhea, unspecified: Secondary | ICD-10-CM | POA: Diagnosis not present

## 2021-09-09 DIAGNOSIS — Z880 Allergy status to penicillin: Secondary | ICD-10-CM | POA: Diagnosis not present

## 2021-09-09 DIAGNOSIS — E1165 Type 2 diabetes mellitus with hyperglycemia: Secondary | ICD-10-CM | POA: Diagnosis present

## 2021-09-09 DIAGNOSIS — B9620 Unspecified Escherichia coli [E. coli] as the cause of diseases classified elsewhere: Secondary | ICD-10-CM | POA: Diagnosis present

## 2021-09-09 DIAGNOSIS — R739 Hyperglycemia, unspecified: Secondary | ICD-10-CM

## 2021-09-09 DIAGNOSIS — E119 Type 2 diabetes mellitus without complications: Secondary | ICD-10-CM

## 2021-09-09 DIAGNOSIS — N139 Obstructive and reflux uropathy, unspecified: Secondary | ICD-10-CM | POA: Diagnosis present

## 2021-09-09 DIAGNOSIS — N183 Chronic kidney disease, stage 3 unspecified: Secondary | ICD-10-CM

## 2021-09-09 DIAGNOSIS — E860 Dehydration: Secondary | ICD-10-CM | POA: Diagnosis present

## 2021-09-09 LAB — I-STAT VENOUS BLOOD GAS, ED
Acid-base deficit: 5 mmol/L — ABNORMAL HIGH (ref 0.0–2.0)
Bicarbonate: 20.5 mmol/L (ref 20.0–28.0)
Calcium, Ion: 1.1 mmol/L — ABNORMAL LOW (ref 1.15–1.40)
HCT: 37 % (ref 36.0–46.0)
Hemoglobin: 12.6 g/dL (ref 12.0–15.0)
O2 Saturation: 23 %
Potassium: 4.3 mmol/L (ref 3.5–5.1)
Sodium: 133 mmol/L — ABNORMAL LOW (ref 135–145)
TCO2: 22 mmol/L (ref 22–32)
pCO2, Ven: 39.6 mmHg — ABNORMAL LOW (ref 44–60)
pH, Ven: 7.322 (ref 7.25–7.43)
pO2, Ven: 18 mmHg — CL (ref 32–45)

## 2021-09-09 LAB — CBC WITH DIFFERENTIAL/PLATELET
Abs Immature Granulocytes: 0.05 10*3/uL (ref 0.00–0.07)
Basophils Absolute: 0 10*3/uL (ref 0.0–0.1)
Basophils Relative: 0 %
Eosinophils Absolute: 0 10*3/uL (ref 0.0–0.5)
Eosinophils Relative: 0 %
HCT: 36.1 % (ref 36.0–46.0)
Hemoglobin: 12.2 g/dL (ref 12.0–15.0)
Immature Granulocytes: 0 %
Lymphocytes Relative: 7 %
Lymphs Abs: 0.8 10*3/uL (ref 0.7–4.0)
MCH: 30.8 pg (ref 26.0–34.0)
MCHC: 33.8 g/dL (ref 30.0–36.0)
MCV: 91.2 fL (ref 80.0–100.0)
Monocytes Absolute: 0.6 10*3/uL (ref 0.1–1.0)
Monocytes Relative: 5 %
Neutro Abs: 10.1 10*3/uL — ABNORMAL HIGH (ref 1.7–7.7)
Neutrophils Relative %: 88 %
Platelets: 255 10*3/uL (ref 150–400)
RBC: 3.96 MIL/uL (ref 3.87–5.11)
RDW: 13.3 % (ref 11.5–15.5)
WBC: 11.5 10*3/uL — ABNORMAL HIGH (ref 4.0–10.5)
nRBC: 0 % (ref 0.0–0.2)

## 2021-09-09 LAB — COMPREHENSIVE METABOLIC PANEL
ALT: 18 U/L (ref 0–44)
AST: 22 U/L (ref 15–41)
Albumin: 3.6 g/dL (ref 3.5–5.0)
Alkaline Phosphatase: 104 U/L (ref 38–126)
Anion gap: 16 — ABNORMAL HIGH (ref 5–15)
BUN: 55 mg/dL — ABNORMAL HIGH (ref 8–23)
CO2: 16 mmol/L — ABNORMAL LOW (ref 22–32)
Calcium: 9 mg/dL (ref 8.9–10.3)
Chloride: 99 mmol/L (ref 98–111)
Creatinine, Ser: 3.02 mg/dL — ABNORMAL HIGH (ref 0.44–1.00)
GFR, Estimated: 15 mL/min — ABNORMAL LOW (ref >60)
Glucose, Bld: 430 mg/dL — ABNORMAL HIGH (ref 70–99)
Potassium: 3.6 mmol/L (ref 3.5–5.1)
Sodium: 131 mmol/L — ABNORMAL LOW (ref 135–145)
Total Bilirubin: 0.5 mg/dL (ref 0.3–1.2)
Total Protein: 8.4 g/dL — ABNORMAL HIGH (ref 6.5–8.1)

## 2021-09-09 LAB — URINALYSIS, ROUTINE W REFLEX MICROSCOPIC
Glucose, UA: NEGATIVE mg/dL
Ketones, ur: NEGATIVE mg/dL
Nitrite: NEGATIVE
Protein, ur: 100 mg/dL — AB
Specific Gravity, Urine: 1.02 (ref 1.005–1.030)
pH: 8 (ref 5.0–8.0)

## 2021-09-09 LAB — GLUCOSE, CAPILLARY: Glucose-Capillary: 193 mg/dL — ABNORMAL HIGH (ref 70–99)

## 2021-09-09 LAB — URINALYSIS, MICROSCOPIC (REFLEX): Squamous Epithelial / HPF: NONE SEEN (ref 0–5)

## 2021-09-09 LAB — HEMOGLOBIN A1C
Hgb A1c MFr Bld: 8.9 % — ABNORMAL HIGH (ref 4.8–5.6)
Mean Plasma Glucose: 208.73 mg/dL

## 2021-09-09 LAB — BETA-HYDROXYBUTYRIC ACID: Beta-Hydroxybutyric Acid: 0.26 mmol/L (ref 0.05–0.27)

## 2021-09-09 LAB — CBG MONITORING, ED: Glucose-Capillary: 383 mg/dL — ABNORMAL HIGH (ref 70–99)

## 2021-09-09 LAB — LIPASE, BLOOD: Lipase: 25 U/L (ref 11–51)

## 2021-09-09 MED ORDER — INSULIN ASPART 100 UNIT/ML IJ SOLN
0.0000 [IU] | Freq: Three times a day (TID) | INTRAMUSCULAR | Status: DC
  Administered 2021-09-10 – 2021-09-12 (×2): 1 [IU] via SUBCUTANEOUS

## 2021-09-09 MED ORDER — LACTATED RINGERS IV BOLUS
1000.0000 mL | Freq: Once | INTRAVENOUS | Status: AC
  Administered 2021-09-09: 1000 mL via INTRAVENOUS

## 2021-09-09 MED ORDER — ACETAMINOPHEN 325 MG PO TABS
650.0000 mg | ORAL_TABLET | Freq: Four times a day (QID) | ORAL | Status: DC | PRN
  Administered 2021-09-09: 650 mg via ORAL
  Filled 2021-09-09: qty 2

## 2021-09-09 MED ORDER — SODIUM CHLORIDE 0.9 % IV SOLN
1.0000 g | INTRAVENOUS | Status: DC
  Administered 2021-09-09 – 2021-09-10 (×2): 1 g via INTRAVENOUS
  Filled 2021-09-09 (×2): qty 10

## 2021-09-09 MED ORDER — NITROFURANTOIN MONOHYD MACRO 100 MG PO CAPS
100.0000 mg | ORAL_CAPSULE | Freq: Two times a day (BID) | ORAL | Status: DC
Start: 2021-09-09 — End: 2021-09-09

## 2021-09-09 MED ORDER — ACETAMINOPHEN 500 MG PO TABS
1000.0000 mg | ORAL_TABLET | Freq: Once | ORAL | Status: AC
  Administered 2021-09-09: 1000 mg via ORAL
  Filled 2021-09-09: qty 2

## 2021-09-09 MED ORDER — INSULIN ASPART 100 UNIT/ML IJ SOLN: 0.0000 [IU] | Freq: Every day | INTRAMUSCULAR | Status: DC

## 2021-09-09 MED ORDER — ONDANSETRON 4 MG PO TBDP
4.0000 mg | ORAL_TABLET | Freq: Once | ORAL | Status: AC
  Administered 2021-09-09: 4 mg via ORAL
  Filled 2021-09-09: qty 1

## 2021-09-09 MED ORDER — LACTATED RINGERS IV SOLN: INTRAVENOUS | Status: DC

## 2021-09-09 MED ORDER — ONDANSETRON 4 MG PO TBDP: 4.0000 mg | ORAL_TABLET | Freq: Three times a day (TID) | ORAL | Status: DC | PRN

## 2021-09-09 MED ORDER — HEPARIN SODIUM (PORCINE) 5000 UNIT/ML IJ SOLN
5000.0000 [IU] | Freq: Three times a day (TID) | INTRAMUSCULAR | Status: DC
  Administered 2021-09-09 – 2021-09-12 (×8): 5000 [IU] via SUBCUTANEOUS
  Filled 2021-09-09 (×7): qty 1

## 2021-09-09 MED ORDER — ACETAMINOPHEN 650 MG RE SUPP: 650.0000 mg | Freq: Four times a day (QID) | RECTAL | Status: DC | PRN

## 2021-09-09 NOTE — ED Provider Notes (Signed)
[facility] EMERGENCY DEPARTMENT Provider Note   CSN: [id number] Arrival date & time: 09/09/21  7829     History  Chief Complaint  Patient presents with   Fall    Emesis Heidi [name]    Heidi [name] is a 81 y.o. female.  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  Patient is Spanish-speaking.  History obtained from patient's family at bedside.  Yesterday patient developed vomiting and diarrhea.  No fevers or abdominal pain.  No chest pain or shortness of breath.  Last evening she felt lightheaded and fell.  She hit her head and reported headache afterwards.  She also reported left shoulder pain.  Vomiting resolved last night but she did have diarrhea this morning.  Patient feels very weak.  Family and patient report decreased nephrostomy tube output.       Home Medications Prior to Admission medications   Medication Sig Start Date End Date Taking? Authorizing Provider  amLODipine (NORVASC) 2.5 MG tablet Take 1 tablet (2.5 mg total) by mouth daily. Patient not taking: Reported on 04/04/2021 10/18/15   Vassie Loll, MD  Cholecalciferol (VITAMIN D3) 1.25 MG (50000 UT) CAPS Take 1 capsule by mouth once a week. 07/16/20   [provider]  ferrous sulfate 325 (65 FE) MG tablet Take 1 tablet (325 mg total) by mouth daily. 11/05/20 11/05/21  Zigmund Daniel., MD  glimepiride (AMARYL) 2 MG tablet Take 2 mg by mouth daily. 10/21/20   [provider]  nitrofurantoin, macrocrystal-monohydrate, (MACROBID) 100 MG capsule Take 100 mg by mouth 2 (two) times daily. 02/27/21   [provider]      Allergies    Penicillins    Review of Systems   Review of Systems  Physical Exam Updated Vital  Signs BP 96/72 (BP Location: Left Arm)   Pulse 89   Temp 97.7 F (36.5 C) (Oral)   Resp 17   SpO2 99%   Physical Exam Vitals and nursing note reviewed.  Constitutional:      General: She is not in acute distress.    Appearance: She is well-developed.  HENT:     Head: Normocephalic and atraumatic.     Right Ear: External ear normal.     Left Ear: External ear normal.     Nose: Nose normal.  Eyes:     Conjunctiva/sclera: Conjunctivae normal.  Cardiovascular:     Rate and Rhythm: Normal rate and regular rhythm.     Heart sounds: No murmur heard. Pulmonary:     Effort: No respiratory distress.     Breath sounds: No wheezing, rhonchi or rales.  Abdominal:     Palpations: Abdomen is soft.     Tenderness: There is no abdominal tenderness. There is no guarding or rebound.     Comments: Small amount of urine from right nephrostomy, no urine from left nephrostomy.  Abdomen is soft and nontender.  Musculoskeletal:     Cervical back: Normal range of motion and neck supple.     Right lower leg: No edema.     Left lower leg: No edema.  Skin:    General: Skin is warm and dry.     Findings: No rash.  Neurological:     General: No focal deficit present.     Mental Status: She is alert. Mental  status is at baseline.     Motor: No weakness.  Psychiatric:        Mood and Affect: Mood normal.     ED Results / Procedures / Treatments   Labs (all labs ordered are listed, but only abnormal results are displayed) Labs Reviewed  CBC WITH DIFFERENTIAL/PLATELET - Abnormal; Notable for the following components:      Result Value   WBC 11.5 (*)    Neutro Abs 10.1 (*)    All other components within normal limits  COMPREHENSIVE METABOLIC PANEL - Abnormal; Notable for the following components:   Sodium 131 (*)    CO2 16 (*)    Glucose, Bld 430 (*)    BUN 55 (*)    Creatinine, Ser 3.02 (*)    Total Protein 8.4 (*)    GFR, Estimated 15 (*)    Anion gap 16 (*)    All other components  within normal limits  CBG MONITORING, ED - Abnormal; Notable for the following components:   Glucose-Capillary 383 (*)    All other components within normal limits  I-STAT VENOUS BLOOD GAS, ED - Abnormal; Notable for the following components:   pCO2, Ven 39.6 (*)    pO2, Ven 18 (*)    Acid-base deficit 5.0 (*)    Sodium 133 (*)    Calcium, Ion 1.10 (*)    All other components within normal limits  LIPASE, BLOOD  BETA-HYDROXYBUTYRIC ACID  URINALYSIS, ROUTINE W REFLEX MICROSCOPIC    ED ECG REPORT   Date: 09/09/2021  Rate: 84  Rhythm: normal sinus rhythm  QRS Axis: left  Intervals: normal  ST/T Wave abnormalities: nonspecific T wave changes  Conduction Disutrbances:none  Narrative Interpretation:   Old EKG Reviewed: unchanged  I have personally reviewed the EKG tracing and agree with the computerized printout as noted.   Radiology CT ABDOMEN PELVIS WO CONTRAST  Result Date: 09/09/2021 CLINICAL DATA:  Vomiting and diarrhea. History of cervical cancer with ureteral obstruction and bilateral nephrostomy catheters. EXAM: CT ABDOMEN AND PELVIS WITHOUT CONTRAST TECHNIQUE: Multidetector CT imaging of the abdomen and pelvis was performed following the standard protocol without IV contrast. RADIATION DOSE REDUCTION: This exam was performed according to the departmental dose-optimization program which includes automated exposure control, adjustment of the mA and/or kV according to patient size and/or use of iterative reconstruction technique. COMPARISON:  PET-CT dated November 01, 2015. CT abdomen pelvis dated October 11, 2015. FINDINGS: Lower chest: No acute abnormality. Hepatobiliary: No focal liver abnormality is seen. Unchanged mild gallbladder distention with multiple small gallstones. No gallbladder wall thickening or biliary dilatation. Pancreas: Unremarkable. No pancreatic ductal dilatation or surrounding inflammatory changes. Spleen: Normal in size without focal abnormality. Adrenals/Urinary  Tract: Adrenal glands are unremarkable. Bilateral renal cortical scarring is mildly progressed since 2017. Bilateral nephrostomy tubes are appropriately positioned. Trace foci of air within both renal collecting systems is likely due to recent instrumentation. No renal calculi or hydronephrosis. Air within the bladder is also likely due to recent instrumentation. Stomach/Bowel: Stomach is within normal limits. Diminutive or absent appendix. No evidence of bowel wall thickening, distention, or inflammatory changes. Vascular/Lymphatic: Aortic atherosclerosis. No enlarged abdominal or pelvic lymph nodes. Reproductive: Uterus and bilateral adnexa are unremarkable. Other: Multiple surgical clips in the pelvis again noted. No free fluid or pneumoperitoneum. Musculoskeletal: No acute or significant osseous findings. IMPRESSION: 1. No acute intra-abdominal process. 2. Bilateral nephrostomy tubes are appropriately positioned. No hydronephrosis. 3. Unchanged cholelithiasis. 4. Aortic Atherosclerosis (ICD10-I70.0). Electronically Signed  By: Obie Dredge M.D.   On: 09/09/2021 09:18   DG Shoulder Left  Result Date: 09/09/2021 CLINICAL DATA:  fall and pain EXAM: LEFT SHOULDER - 2+ VIEW COMPARISON:  None Available. FINDINGS: There is no evidence of fracture or dislocation. Glenohumeral joint is well-maintained. Mild degenerative/hypertrophic changes at the [facility] joint. Osteopenia. Soft tissues are unremarkable. IMPRESSION: No fracture or dislocation. Mild degenerative/hypertrophic changes at the [facility] joint. Electronically Signed   By: Marjo Bicker M.D.   On: 09/09/2021 07:37    Procedures Procedures    Medications Ordered in ED Medications  lactated ringers infusion (has no administration in time range)  ondansetron (ZOFRAN-ODT) disintegrating tablet 4 mg (4 mg Oral Given 09/09/21 0631)  acetaminophen (TYLENOL) tablet 1,000 mg (1,000 mg Oral Given 09/09/21 0630)  lactated ringers bolus 1,000 mL (1,000 mLs Intravenous  New Bag/Given 09/09/21 1311)    ED Course/ Medical Decision Making/ A&P Clinical Course as of 09/09/21 1304  Tue Sep 09, 2021  1247 Glucose(!): 430 [JL]  1248 CO2(!): 16 [JL]  1248 Anion gap(!): 16 [JL]  1248 Creatinine(!): 3.02 [JL]    Clinical Course User Index [JL] Ernie Avena, MD   Patient seen and examined. History obtained directly from patient and family member at bedside.  Also reviewed previous hospitalization notes.  Work-up including labs, imaging, EKG ordered in triage, if performed, were reviewed.    Labs/EKG: Independently reviewed and interpreted.  This included: CBC with mild leukocytosis at 11.5, otherwise unremarkable with normal hemoglobin; CMP with glucose of 430, bicarb 16, minimally elevated anion gap at 16, creatinine 3.02 worse than baseline and BUN of 55; lipase normal.   Pending EKG, UA, VBG, beta hydroxybutyrate.  Imaging: Independently visualized and interpreted.  This included: CT of the abdomen pelvis agree no acute findings, x-ray of the shoulder, agree no fractures.  Added CT head which is pending.  Medications/Fluids: Ordered: IV fluid bolus   Most recent vital signs reviewed and are as follows: BP 96/72 (BP Location: Left Arm)   Pulse 89   Temp 97.7 F (36.5 C) (Oral)   Resp 17   SpO2 99%   Initial impression: Recent nausea, vomiting, diarrhea, likely causing hypovolemia which caused a syncopal spell.  Now with AKI, hyperglycemia, possible mild DKA. Will need admission when remainder of work-up completed.   3:13 PM Reassessment performed. Patient appears stable, sleeping.  Labs personally reviewed and interpreted including: VBG with normal pH, normal beta hydroxybutyrate.  Continuing to await UA.  Appears to be in process.  Reviewed pertinent lab work and imaging with patient at bedside. Questions answered.   Most current vital signs reviewed and are as follows: BP 129/71   Pulse 70   Temp 97.8 F (36.6 C) (Oral)   Resp 16   SpO2 98%    Plan: Admit to hospital.   3:40 PM Consulted with Family Practice resident who will see patient in the ED.                             Medical Decision Making Amount and/or Complexity of Data Reviewed Labs: ordered. Decision-making details documented in ED Course. Radiology: ordered.  Risk Prescription drug management.   Patient with acute kidney injury in setting of hyperglycemia.  No evidence of DKA today.  Symptoms likely initiated with nausea, vomiting, and diarrhea starting yesterday.  CT of the abdomen and pelvis was negative for acute findings.  Patient did have a fall, likely syncope  related to hypovolemia.  EKG without any concerning findings such as heart block, prolonged QT, WPW, Brugada syndrome, or other arrhythmia.  She does not report any chest pain or shortness of breath.  No concern for sepsis at this time, however UA pending.  Patient will need admission for acute kidney injury and correction of hyperglycemia.        Final Clinical Impression(s) / ED Diagnoses Final diagnoses:  Acute kidney injury (HCC)  Syncope, unspecified syncope type  Hyperglycemia    Rx / DC Orders ED Discharge Orders     None         Renne Crigler, PA-C 09/09/21 1540    Ernie Avena, MD 09/12/21 [phone]

## 2021-09-09 NOTE — ED Triage Notes (Signed)
Patient lost her balance and fell at home this evening , mild headache/left shoulder , she adds emesis and diarrhea last week . No LOC or fever .

## 2021-09-09 NOTE — Assessment & Plan Note (Addendum)
Creatinine this morning was 2.11 and improved from admission (3.02) - Continue maintenance IV fluid at 74m/hr -PT/OT eval and treat -Fall precaution -Continue routine vitals

## 2021-09-09 NOTE — H&P (Signed)
Hospital Admission History and Physical Service Pager: [phone]  Patient name: Heidi [name] Medical record number: [id number] Date of Birth: [birth year] Age: 81 y.o. Gender: female  Primary Care Provider: Patient, No Pcp Per Consultants: None Code Status: Full  Preferred Emergency Contact:  Contact Information     Name Relation Home Work Mobile   Chelan Granddaughter [phone]  [phone]   Heidi [name] Daughter [phone]          Chief Complaint: Generalized weakness and fall  Assessment and Plan: [name] is a 81 y.o. female presenting with AKI after bout of vomiting and diarrhea . Differential for this patient's presentation of this includes Post obstructive AKI which is less likely give absence of hydronephrosis on CT abdomen.   * Acute renal failure superimposed on stage 3 chronic kidney disease ([place]) Initial labs showed creatinine of 3.02 and baseline is  ~1.7.  GFR is 15, BUN 55 and sodium was 133 with calculated correction of 139. Her vitals show intermittent hypotension and tachycardia. She is reported to have decreased UOP in the nephrostomy bag. Givn patient's worsening creatinine with recent history of vomiting and diarrhea with decreased UOP, her presentation is suspicious of prerenal AKI in the setting of dehydration. - Admit to Med Surg with FPTS, attending Dr. Gwendlyn Deutscher - Continue maintenance IV fluid  -PT/OT eval and treat -Fall precaution -Placed on carb modified diet -Zofran Q6H PRN  -Continue routine vitals -Heaprin for VTE prophylaxis d/t GFR  Diabetes mellitus, type 2 ([place]) Blood glucose on admission was 430. Home medication include glimepiride 2 mg daily. -start very sensitive SSI -Continue CBG monitoring -Continue mIVF   UTI (urinary tract infection) Initial labs show leukocytosis to 11.5 and UA with moderate leukocytosis, turbid appearance and many bacteria. These findings are consistent with UTI.   -Obtain urine culture -Start Macrobid 100 mg BID  -Monitor routine vitals  Viral gastroenteritis Patient's chief complaint include nausea, vomiting, and diarrhea with generalized weakness. She's been afebrile with mild leukocytosis of 11. 5 on admission. Her constellation of symptoms and lab findings is suspicious of viral gastroenteritis. -Continue maintenance IV fluid -Continue routine vitals.  Obstructive uropathy Stable. S/p bilateral nephrostomy tube placement in the setting of bladder fibrosis/ stricture post radiation treatment for Cervical cancer.  Has minimal urine in the nephrostomy bag. CT abdomen show bilateral nephrostomy tubes are appropriately positioned and no signs of hydronephrosis.    FEN/GI: Carb modified diet VTE Prophylaxis: Heparin  Disposition: Admit to Med Surg  History of Present Illness:  Heidi [name] is a 81 y.o. female presenting with AKI  Pertinent history provided by patient's daughter who is also her caregiver.  According to daughter patient's symptoms started yesterday with nausea, vomiting and diarrhea.  Was complaining about generalized weakness and had a fall while going to the bathroom.  Daughter denies any concerns for dizziness band thinks fall is related to generalized weakness.  Denies any recent illness or sick contacts but endorses headache 2 days ago that responded well to Tylenol.  Patient has had poor p.o. intake due to decreased appetite and has had very little water to drink in the last day or two.  Denies any dizziness, chest pain, shortness of breath, or LOC. She has no recent travels, no new change in medication or diet.  Of note patient has history of cervical cancer status post deviation treatment.  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  Patient was  described as poor candidate for surgery and so nephrostomy tube was placed 6 years ago.  He has had  current UTIs and most recently was in April of this year.  In the ED, patient was started on continuous IV fluid for concerns of dehydration.  Initial labs were obtained including BMP which showed elevated creatinine and UA consistent with  .  Abdominal CT, head CT and right shoulder x-ray were normal.  Used Spanish interpreting service for this encounter with patient   Review Of Systems: Per HPI   Pertinent Past Medical History: Reviewed in history tab.   Pertinent Past Surgical History: Reviewed in history tab.   Pertinent Social History: Tobacco use: No Alcohol use: No Other Substance use: No Lives with daughter  Pertinent Family History: Reviewed in history tab.   Important Outpatient Medications: Reviewed in medication history.   Objective: BP (!) 129/48   Pulse 80   Temp 97.8 F (36.6 C) (Oral)   Resp 14   SpO2 100%  Exam: General: Awake, appears fatigued, NAD CV: RRR, no murmurs, normal S1/S2 Pulm: CTAB, good WOB on RA, no crackles or wheezing Abd: Soft, no distension, no tenderness Skin: dry, warm Ext: No BLE edema, +2 Pedal and radial pulse. Neuro: Oriented x3 and no Focal neuro deficit    Labs:  CBC BMET  Recent Labs  Lab 09/09/21 0625 09/09/21 1308  WBC 11.5*  --   HGB 12.2 12.6  HCT 36.1 37.0  PLT 255  --    Recent Labs  Lab 09/09/21 0625 09/09/21 1308  NA 131* 133*  K 3.6 4.3  CL 99  --   CO2 16*  --   BUN 55*  --   CREATININE 3.02*  --   GLUCOSE 430*  --   CALCIUM 9.0  --       EKG: Not obtained    Imaging Studies Performed: CT HEAD WO CONTRAST (5MM)  Result Date: 09/09/2021 CLINICAL DATA:  Head trauma, fall, with nausea, vomiting, and diarrhea since yesterday EXAM: CT HEAD WITHOUT CONTRAST TECHNIQUE: Contiguous axial images were obtained from the base of the skull through the vertex without intravenous contrast. RADIATION DOSE REDUCTION: This exam was performed according to the departmental dose-optimization program which  includes automated exposure control, adjustment of the mA and/or kV according to patient size and/or use of iterative reconstruction technique. COMPARISON:  None FINDINGS: Brain: Generalized atrophy. Normal ventricular morphology. No midline shift or mass effect. Small vessel chronic ischemic changes of deep cerebral white matter. No intracranial hemorrhage, mass lesion, evidence of acute infarction, or extra-axial fluid collection. Vascular: No hyperdense vessels. Atherosclerotic calcification of internal carotid and vertebral arteries at skull base Skull: Intact Sinuses/Orbits: Clear Other: N/A IMPRESSION: Atrophy with small vessel chronic ischemic changes of deep cerebral white matter. No acute intracranial abnormalities. Electronically Signed   By: Lavonia Dana M.D.   On: 09/09/2021 14:01   CT ABDOMEN PELVIS WO CONTRAST  Result Date: 09/09/2021 CLINICAL DATA:  Vomiting and diarrhea. History of cervical cancer with ureteral obstruction and bilateral nephrostomy catheters. EXAM: CT ABDOMEN AND PELVIS WITHOUT CONTRAST TECHNIQUE: Multidetector CT imaging of the abdomen and pelvis was performed following the standard protocol without IV contrast. RADIATION DOSE REDUCTION: This exam was performed according to the departmental dose-optimization program which includes automated exposure control, adjustment of the mA and/or kV according to patient size and/or use of iterative reconstruction technique. COMPARISON:  PET-CT dated November 01, 2015. CT abdomen pelvis dated October 11, 2015. FINDINGS: Lower chest:  No acute abnormality. Hepatobiliary: No focal liver abnormality is seen. Unchanged mild gallbladder distention with multiple small gallstones. No gallbladder wall thickening or biliary dilatation. Pancreas: Unremarkable. No pancreatic ductal dilatation or surrounding inflammatory changes. Spleen: Normal in size without focal abnormality. Adrenals/Urinary Tract: Adrenal glands are unremarkable. Bilateral renal cortical  scarring is mildly progressed since 2017. Bilateral nephrostomy tubes are appropriately positioned. Trace foci of air within both renal collecting systems is likely due to recent instrumentation. No renal calculi or hydronephrosis. Air within the bladder is also likely due to recent instrumentation. Stomach/Bowel: Stomach is within normal limits. Diminutive or absent appendix. No evidence of bowel wall thickening, distention, or inflammatory changes. Vascular/Lymphatic: Aortic atherosclerosis. No enlarged abdominal or pelvic lymph nodes. Reproductive: Uterus and bilateral adnexa are unremarkable. Other: Multiple surgical clips in the pelvis again noted. No free fluid or pneumoperitoneum. Musculoskeletal: No acute or significant osseous findings. IMPRESSION: 1. No acute intra-abdominal process. 2. Bilateral nephrostomy tubes are appropriately positioned. No hydronephrosis. 3. Unchanged cholelithiasis. 4. Aortic Atherosclerosis (ICD10-I70.0). Electronically Signed   By: Titus Dubin M.D.   On: 09/09/2021 09:18   DG Shoulder Left  Result Date: 09/09/2021 CLINICAL DATA:  fall and pain EXAM: LEFT SHOULDER - 2+ VIEW COMPARISON:  None Available. FINDINGS: There is no evidence of fracture or dislocation. Glenohumeral joint is well-maintained. Mild degenerative/hypertrophic changes at the [facility] joint. Osteopenia. Soft tissues are unremarkable. IMPRESSION: No fracture or dislocation. Mild degenerative/hypertrophic changes at the [facility] joint. Electronically Signed   By: Frazier Richards M.D.   On: 09/09/2021 07:37    My interpretation: Imagining without any abnormalities   Alen Bleacher, MD 09/09/2021, 5:37 PM PGY-1, [place] Intern pager: [phone], text pages welcome Secure chat group [place]

## 2021-09-09 NOTE — ED Notes (Signed)
Transport placed for patient

## 2021-09-09 NOTE — Assessment & Plan Note (Addendum)
CBG this morning was 104. Home medication include glimepiride 2 mg daily. -Continue very sensitive SSI -Continue CBG monitoring -Continue mIVF

## 2021-09-09 NOTE — Assessment & Plan Note (Deleted)
Stable. S/p bilateral nephrostomy tube placement in the setting of bladder fibrosis/ stricture post radiation treatment for Cervical cancer.  Has minimal urine in the nephrostomy bag. CT abdomen show bilateral nephrostomy tubes are appropriately positioned and no signs of hydronephrosis.

## 2021-09-09 NOTE — ED Provider Triage Note (Signed)
  Emergency Medicine Provider Triage Evaluation Note  MRN:  [id number]  Arrival date & time: 09/09/21    Medically screening exam initiated at 6:23 AM.   CC:   No chief complaint on file.   HPI:  Heidi [name] is a 81 y.o. year-old female presents to the ED with chief complaint of nausea, vomiting, and diarrhea that started yesterday.  Also states that she fell while trying to get the toilet and has complained of left shoulder pain.  States that the nausea and vomiting have basically resolved, but she is still having diarrhea.  History provided by History provided by patient. ROS:  -As included in HPI PE:   Vitals:   09/09/21 0618  BP: 112/67  Pulse: (!) 113  Resp: 16  Temp: 100 F (37.8 C)  SpO2: 98%    Non-toxic appearing No respiratory distress  MDM:   I've ordered labs and imaging in triage to expedite lab/diagnostic workup.  Patient was informed that the remainder of the evaluation will be completed by another provider, this initial triage assessment does not replace that evaluation, and the importance of remaining in the ED until their evaluation is complete.    Montine Circle, PA-C 09/09/21 [phone]

## 2021-09-09 NOTE — Assessment & Plan Note (Addendum)
Diarrhea is improved. Patient has 4 BM in last 24hrs -Continue maintenance IV fluid - Follow up Ortho static vitals -Continue routine vitals. -Add PRN Imodium - Add [place] for skin irritation - Add fiber supplement BID

## 2021-09-09 NOTE — Assessment & Plan Note (Addendum)
Patient is afebrile. -Switch abx to Keflex (day 4 of 5) -Monitor routine vitals

## 2021-09-10 DIAGNOSIS — N390 Urinary tract infection, site not specified: Secondary | ICD-10-CM

## 2021-09-10 DIAGNOSIS — N179 Acute kidney failure, unspecified: Secondary | ICD-10-CM | POA: Diagnosis not present

## 2021-09-10 DIAGNOSIS — R197 Diarrhea, unspecified: Secondary | ICD-10-CM

## 2021-09-10 DIAGNOSIS — E1122 Type 2 diabetes mellitus with diabetic chronic kidney disease: Secondary | ICD-10-CM

## 2021-09-10 DIAGNOSIS — N184 Chronic kidney disease, stage 4 (severe): Secondary | ICD-10-CM

## 2021-09-10 LAB — CBC
HCT: 30.2 % — ABNORMAL LOW (ref 36.0–46.0)
Hemoglobin: 9.9 g/dL — ABNORMAL LOW (ref 12.0–15.0)
MCH: 30.7 pg (ref 26.0–34.0)
MCHC: 32.8 g/dL (ref 30.0–36.0)
MCV: 93.5 fL (ref 80.0–100.0)
Platelets: 179 10*3/uL (ref 150–400)
RBC: 3.23 MIL/uL — ABNORMAL LOW (ref 3.87–5.11)
RDW: 13.7 % (ref 11.5–15.5)
WBC: 6.9 10*3/uL (ref 4.0–10.5)
nRBC: 0 % (ref 0.0–0.2)

## 2021-09-10 LAB — C DIFFICILE QUICK SCREEN W PCR REFLEX
C Diff antigen: NEGATIVE
C Diff interpretation: NOT DETECTED
C Diff toxin: NEGATIVE

## 2021-09-10 LAB — GLUCOSE, CAPILLARY
Glucose-Capillary: 143 mg/dL — ABNORMAL HIGH (ref 70–99)
Glucose-Capillary: 165 mg/dL — ABNORMAL HIGH (ref 70–99)
Glucose-Capillary: 84 mg/dL (ref 70–99)
Glucose-Capillary: 104 mg/dL — ABNORMAL HIGH (ref 70–99)

## 2021-09-10 LAB — BASIC METABOLIC PANEL
Anion gap: 12 (ref 5–15)
BUN: 52 mg/dL — ABNORMAL HIGH (ref 8–23)
CO2: 19 mmol/L — ABNORMAL LOW (ref 22–32)
Calcium: 8.4 mg/dL — ABNORMAL LOW (ref 8.9–10.3)
Chloride: 107 mmol/L (ref 98–111)
Creatinine, Ser: 2.78 mg/dL — ABNORMAL HIGH (ref 0.44–1.00)
GFR, Estimated: 16 mL/min — ABNORMAL LOW (ref >60)
Glucose, Bld: 145 mg/dL — ABNORMAL HIGH (ref 70–99)
Potassium: 3.5 mmol/L (ref 3.5–5.1)
Sodium: 138 mmol/L (ref 135–145)

## 2021-09-10 LAB — MAGNESIUM: Magnesium: 1.7 mg/dL (ref 1.7–2.4)

## 2021-09-10 NOTE — Progress Notes (Signed)
  S: Went to bedside to check on patient, she was asleep so I did not wake her.   O: BP (!) 90/45   Pulse 89   Temp 98.8 F (37.1 C) (Oral)   Resp 18   Ht '4\' 9"'$  (1.448 m)   Wt 47 kg   SpO2 98%   BMI 22.42 kg/m   General: Patient sleeping comfortably, in no acute distress. Resp: normal work of breathing noted  A/P: Patient admitted after a fall and noted to have AKI. Received 1L bolus LR and now on mIVF. Continue fluids until the morning and then day team to reassess. Vitals stable. Orders reviewed and appropriate. Continue plan per day team.   Donney Dice, DO 09/10/2021, 12:39 AM PGY-2, [place] Medicine Service pager [phone]

## 2021-09-10 NOTE — Hospital Course (Signed)
Heidi [name] is a 81 y.o. female who was admitted to the [facility] Teaching Service at [facility] for AKI. Hospital course is outlined below by system.    Pre-renal AKI  Patient on admission was found to have AKI with creatinine of 102 on admission and her baseline is usually ~1.7.  History of an artery obstruction however CT abdomen showed no hydronephrosis.  Reported to of nausea, vomiting and diarrhea with poor p.o. intake prior to admission.  AKI is likely prerenal in the setting of dehydration and patient's creatinine improved with fluid resuscitation.  By the time of discharge patient's creatinine improved to***  Viral gastroenteritis Patient presented to the ED after 1 day history of nausea, vomiting and watery diarrhea that are nonbloody. Also reported to have generalized weakness and poor p.o. intake due to low appetite.  No recent travel or antibiotic treatment.  Patient continued to have watery diarrhea and C. difficile PCR was obtained which showed***  UTI Patient has extensive history of recurrent UTI.  And UA on admission showed moderate leukocytosis, turbid appearance and many bacteria.  She was started on IV ceftriaxone and eventually***    PCP follow-up recommendations

## 2021-09-10 NOTE — Progress Notes (Signed)
     Daily Progress Note Intern Pager: [phone]  Patient name: Heidi [name] Medical record number: [id number] Date of birth: 05/21/1940 Age: 81 y.o. Gender: female  Primary Care Provider: Patient, No Pcp Per Consultants: None Code Status: Full  Pt Overview and Major Events to Date:  6/20: Admitted  Assessment and Plan:  Heidi [name] is a 81 year old female presenting with AKI.Marland Kitchen Pertinent PMH/PSH includes cervical cancer s/p radiation therapy, urinary obstruction s/p bilateral nephrostomy tube, HTN, T2DM, CKD.   * Acute renal failure superimposed on stage 3 chronic kidney disease (HCC) Creatinine this morning was 2.78 and improved from admission (3.02) - Continue maintenance IV fluid  -PT/OT eval and treat -Fall precaution -Continue routine vitals   Viral gastroenteritis Patient had 4 water BM since last night. Still have poor PO intake -Continue maintenance IV fluid - Follow up Ortho static vitals -Continue routine vitals. -Enteric precautions -Obtain C. Diff panel  UTI (urinary tract infection) Patient is afebrile. Urine culture pending -Follow up urine culture -Continue CTX  -Monitor routine vitals  Diabetes mellitus, type 2 (HCC) CBG this morning was 143. Home medication include glimepiride 2 mg daily. -Continue very sensitive SSI -Continue CBG monitoring -Continue mIVF    FEN/GI: Carb modified diet PPx: Heparin Dispo:Home pending clinical improvement .   Subjective:  No acute events overnight.  Patient's is doing better and daughter said patient has had 4 watery bowel movements this morning.  Objective: Temp:  [97.7 F (36.5 C)-99.9 F (37.7 C)] 99.9 F (37.7 C) (06/21 0527) Pulse Rate:  [70-103] 87 (06/21 0913) Resp:  [14-19] 18 (06/21 0913) BP: (90-134)/(45-72) 119/51 (06/21 0913) SpO2:  [96 %-100 %] 97 % (06/21 0913) Weight:  [47 kg] 47 kg (06/20 2125) Physical Exam: General: Generally fatigued, NAD CV: RRR, no murmurs,  normal S1/S2 Pulm: CTAB, good WOB on RA Abd: Soft, no distension, no tenderness Skin: dry, warm Ext: No BLE edema, +2 Pedal and radial pulse.   Laboratory: Most recent CBC Lab Results  Component Value Date   WBC 6.9 09/10/2021   HGB 9.9 (L) 09/10/2021   HCT 30.2 (L) 09/10/2021   MCV 93.5 09/10/2021   PLT 179 09/10/2021   Most recent BMP    Latest Ref Rng & Units 09/10/2021    7:40 AM  BMP  Glucose 70 - 99 mg/dL 145   BUN 8 - 23 mg/dL 52   Creatinine 0.44 - 1.00 mg/dL 2.78   Sodium 135 - 145 mmol/L 138   Potassium 3.5 - 5.1 mmol/L 3.5   Chloride 98 - 111 mmol/L 107   CO2 22 - 32 mmol/L 19   Calcium 8.9 - 10.3 mg/dL 8.4     Imaging/Diagnostic Tests: No new images  Alen Bleacher, MD 09/10/2021, 9:40 AM  PGY-1, [place] Intern pager: [phone], text pages welcome Secure chat group [place]

## 2021-09-10 NOTE — Evaluation (Signed)
Physical Therapy Evaluation Patient Details Name: Heidi [name] MRN: [id number] DOB: 1940-09-09 Today's Date: 09/10/2021  History of Present Illness  81 y.o. female presenting 6/20 with AKI after bout of vomiting and diarrhea . 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 Pt independent in ambulation without AD, and ADLs, daughter provides assist with iADLs. Pt limited in safe mobility by decreased strength and endurance. Pt is mod I for bed mobility and transfers and supervision for ambulation without AD. Pt will not have any PT needs at discharge, however PT will continue to follow acutely to progress mobility.      Recommendations for follow up therapy are one component of a multi-disciplinary discharge planning process, led by the attending physician.  Recommendations may be updated based on patient status, additional functional criteria and insurance authorization.  Follow Up Recommendations No PT follow up      Assistance Recommended at Discharge Intermittent Supervision/Assistance  Patient can return home with the following  A little help with walking and/or transfers;A little help with bathing/dressing/bathroom;Assistance with cooking/housework;Assist for transportation    Equipment Recommendations None recommended by PT     Functional Status Assessment Patient has had a recent decline in their functional status and demonstrates the ability to make significant improvements in function in a reasonable and predictable amount of time.     Precautions / Restrictions Precautions Precautions: Fall Restrictions Weight Bearing Restrictions: No      Mobility  Bed Mobility Overal bed mobility: Modified Independent             General bed  mobility comments: increased time and effort to come to EOB with use of bedrails    Transfers Overall transfer level: Modified independent Equipment used: None               General transfer comment: good power up and self steadying for sit<>stand from bed and from [employer]    Ambulation/Gait Ambulation/Gait assistance: Supervision Gait Distance (Feet): 20 Feet Assistive device: None Gait Pattern/deviations: Step-through pattern, Decreased step length - right, Decreased step length - left, Shuffle, Wide base of support Gait velocity: slow, steady shuffling gait Gait velocity interpretation: <1.31 ft/sec, indicative of household ambulator   General Gait Details: supervision for safety with ambulation in room with stop at sink to wash hands      Balance Overall balance assessment: Mild deficits observed, not formally tested                                           Pertinent Vitals/Pain Pain Assessment Pain Assessment: No/denies pain    Home Living Family/patient expects to be discharged to:: Private residence Living Arrangements: Children Available Help at Discharge: Family;Available 24 hours/day Type of Home: House Home Access: Level entry       Home Layout: One level Home Equipment: Conservation officer, nature (2 wheels);Tub bench      Prior Function Prior Level of Function : Independent/Modified Independent                        Extremity/Trunk Assessment   Upper Extremity Assessment Upper Extremity Assessment: Defer to OT evaluation    Lower Extremity Assessment Lower Extremity Assessment: Generalized weakness  Communication   Communication: Prefers language other than English Rosemarie Ax [phone])  Cognition Arousal/Alertness: Awake/alert Behavior During Therapy: Flat affect Overall Cognitive Status: Within Functional Limits for tasks assessed                                          General Comments General  comments (skin integrity, edema, etc.): Pt with diarrhea and ask to use [facility], daughter in room throughout session and willing to assist with use of [facility]        Assessment/Plan    PT Assessment Patient needs continued PT services  PT Problem List Decreased activity tolerance;Decreased mobility;Decreased strength       PT Treatment Interventions Gait training;Functional mobility training;Therapeutic activities;Therapeutic exercise;Balance training;Cognitive remediation;Patient/family education    PT Goals (Current goals can be found in the Care Plan section)  Acute Rehab PT Goals Patient Stated Goal: feel better PT Goal Formulation: With patient/family Time For Goal Achievement: 09/24/21 Potential to Achieve Goals: Good    Frequency Min 3X/week        AM-PAC PT "6 Clicks" Mobility  Outcome Measure Help needed turning from your back to your side while in a flat bed without using bedrails?: None Help needed moving from lying on your back to sitting on the side of a flat bed without using bedrails?: None Help needed moving to and from a bed to a chair (including a wheelchair)?: None Help needed standing up from a chair using your arms (e.g., wheelchair or bedside chair)?: None Help needed to walk in hospital room?: None Help needed climbing 3-5 steps with a railing? : A Little 6 Click Score: 23    End of Session Equipment Utilized During Treatment: Gait belt Activity Tolerance: Patient tolerated treatment well Patient left: in chair;with call bell/phone within reach;with family/visitor present Nurse Communication: Mobility status PT Visit Diagnosis: Muscle weakness (generalized) (M62.81);Difficulty in walking, not elsewhere classified (R26.2)    Time: 3832-9191 PT Time Calculation (min) (ACUTE ONLY): 46 min   Charges:   PT Evaluation $PT Eval Moderate Complexity: 1 Mod PT Treatments $Therapeutic Activity: 23-37 mins        Haily Caley B. Migdalia Dk PT, DPT Acute  Rehabilitation Services Please use secure chat or  Call Office [phone]   [place] 09/10/2021, 11:50 AM

## 2021-09-11 LAB — GLUCOSE, CAPILLARY
Glucose-Capillary: 84 mg/dL (ref 70–99)
Glucose-Capillary: 125 mg/dL — ABNORMAL HIGH (ref 70–99)
Glucose-Capillary: 116 mg/dL — ABNORMAL HIGH (ref 70–99)
Glucose-Capillary: 122 mg/dL — ABNORMAL HIGH (ref 70–99)

## 2021-09-11 LAB — URINE CULTURE: Culture: >=100000 — AB

## 2021-09-11 LAB — CBC
HCT: 26.9 % — ABNORMAL LOW (ref 36.0–46.0)
Hemoglobin: 9.2 g/dL — ABNORMAL LOW (ref 12.0–15.0)
MCH: 31.1 pg (ref 26.0–34.0)
MCHC: 34.2 g/dL (ref 30.0–36.0)
MCV: 90.9 fL (ref 80.0–100.0)
Platelets: 167 10*3/uL (ref 150–400)
RBC: 2.96 MIL/uL — ABNORMAL LOW (ref 3.87–5.11)
RDW: 13.4 % (ref 11.5–15.5)
WBC: 5.4 10*3/uL (ref 4.0–10.5)
nRBC: 0 % (ref 0.0–0.2)

## 2021-09-11 LAB — BASIC METABOLIC PANEL
Anion gap: 6 (ref 5–15)
Anion gap: 7 (ref 5–15)
BUN: 34 mg/dL — ABNORMAL HIGH (ref 8–23)
BUN: 29 mg/dL — ABNORMAL HIGH (ref 8–23)
CO2: 17 mmol/L — ABNORMAL LOW (ref 22–32)
CO2: 16 mmol/L — ABNORMAL LOW (ref 22–32)
Calcium: 7.7 mg/dL — ABNORMAL LOW (ref 8.9–10.3)
Calcium: 8.2 mg/dL — ABNORMAL LOW (ref 8.9–10.3)
Chloride: 110 mmol/L (ref 98–111)
Chloride: 111 mmol/L (ref 98–111)
Creatinine, Ser: 2.11 mg/dL — ABNORMAL HIGH (ref 0.44–1.00)
Creatinine, Ser: 1.8 mg/dL — ABNORMAL HIGH (ref 0.44–1.00)
GFR, Estimated: 23 mL/min — ABNORMAL LOW (ref >60)
GFR, Estimated: 28 mL/min — ABNORMAL LOW (ref >60)
Glucose, Bld: 89 mg/dL (ref 70–99)
Glucose, Bld: 152 mg/dL — ABNORMAL HIGH (ref 70–99)
Potassium: 2.9 mmol/L — ABNORMAL LOW (ref 3.5–5.1)
Potassium: 5 mmol/L (ref 3.5–5.1)
Sodium: 133 mmol/L — ABNORMAL LOW (ref 135–145)
Sodium: 134 mmol/L — ABNORMAL LOW (ref 135–145)

## 2021-09-11 MED ORDER — WITCH HAZEL-GLYCERIN EX PADS
MEDICATED_PAD | CUTANEOUS | Status: DC | PRN
  Filled 2021-09-11: qty 100

## 2021-09-11 MED ORDER — LOPERAMIDE HCL 2 MG PO CAPS
2.0000 mg | ORAL_CAPSULE | Freq: Four times a day (QID) | ORAL | Status: DC | PRN
Start: 2021-09-11 — End: 2021-09-12
  Administered 2021-09-11 – 2021-09-12 (×2): 2 mg via ORAL
  Filled 2021-09-11 (×2): qty 1

## 2021-09-11 MED ORDER — ZINC OXIDE 40 % EX OINT
TOPICAL_OINTMENT | CUTANEOUS | Status: DC | PRN
  Filled 2021-09-11 (×2): qty 57

## 2021-09-11 MED ORDER — POTASSIUM CHLORIDE 20 MEQ PO PACK
40.0000 meq | PACK | Freq: Two times a day (BID) | ORAL | Status: AC
  Administered 2021-09-11 (×2): 40 meq via ORAL
  Filled 2021-09-11 (×2): qty 2

## 2021-09-11 MED ORDER — CEPHALEXIN 500 MG PO CAPS
500.0000 mg | ORAL_CAPSULE | Freq: Two times a day (BID) | ORAL | Status: DC
  Administered 2021-09-11 – 2021-09-12 (×3): 500 mg via ORAL
  Filled 2021-09-11 (×3): qty 1

## 2021-09-11 MED ORDER — MAGNESIUM SULFATE 2 GM/50ML IV SOLN
2.0000 g | Freq: Once | INTRAVENOUS | Status: AC
  Administered 2021-09-11: 2 g via INTRAVENOUS
  Filled 2021-09-11: qty 50

## 2021-09-11 MED ORDER — NUTRISOURCE FIBER PO PACK
1.0000 | PACK | Freq: Two times a day (BID) | ORAL | Status: DC
  Administered 2021-09-11 – 2021-09-12 (×3): 1 via ORAL
  Filled 2021-09-11 (×4): qty 1

## 2021-09-11 NOTE — Progress Notes (Signed)
     Daily Progress Note Intern Pager: [phone]  Patient name: Heidi [name] Medical record number: [id number] Date of birth: [birth year] Age: 81 y.o. Gender: female  Primary Care Provider: Patient, No Pcp Per Consultants: None Code Status: Full  Pt Overview and Major Events to Date:  6/20: Admitted  Assessment and Plan: Heidi [name] is a 81 year old female presenting with AKI. Pertinent PMH/PSH includes apical cancer s/p radiation therapy, urinary obstruction s/p bilateral nephrostomy tube, HTN, T2DM, CKD.   * Acute renal failure superimposed on stage 3 chronic kidney disease (HCC) Creatinine this morning was 2.11 and improved from admission (3.02) - Continue maintenance IV fluid at 26m/hr -PT/OT eval and treat -Fall precaution -Continue routine vitals   Viral gastroenteritis Patient had 5 watery BM overnight. C. Diff PCR was negative.  -Continue maintenance IV fluid - Follow up Ortho static vitals -Continue routine vitals. -Add PRN Imodium - Add [place] for skin irritation - Add fiber supplement BID  UTI (urinary tract infection) Patient is afebrile. Urine culture grew over 100,000 colonies of Ecoli -Switch abx to Keflex (day 3 of 5) -Monitor routine vitals  Diabetes mellitus, type 2 (HCC) CBG this morning was 104. Home medication include glimepiride 2 mg daily. -Continue very sensitive SSI -Continue CBG monitoring -Continue mIVF    FEN/GI: Carb modified diet PPx: Heparin Dispo:Home pending clinical improvement .   Subjective:  Patient is doing well and daughter reports she had 4 watery bowel movements overnight.  Objective: Temp:  [97.4 F (36.3 C)-98.3 F (36.8 C)] 97.8 F (36.6 C) (06/22 1005) Pulse Rate:  [69-78] 69 (06/22 1005) Resp:  [16-19] 18 (06/22 1005) BP: (110-123)/(45-61) 123/61 (06/22 1005) SpO2:  [96 %-99 %] 98 % (06/22 1005) Physical Exam: General: Awake, appears fatigued, NAD CV: RRR, no murmurs, normal S1/S2 Pulm:  CTAB, good WOB on RA, no crackles or wheezing Abd: Soft, no distension, no tenderness Ext: No BLE edema, +2 Pedal and radial pulse.   Laboratory: Most recent CBC Lab Results  Component Value Date   WBC 5.4 09/11/2021   HGB 9.2 (L) 09/11/2021   HCT 26.9 (L) 09/11/2021   MCV 90.9 09/11/2021   PLT 167 09/11/2021   Most recent BMP    Latest Ref Rng & Units 09/11/2021    4:29 AM  BMP  Glucose 70 - 99 mg/dL 89   BUN 8 - 23 mg/dL 34   Creatinine 0.44 - 1.00 mg/dL 2.11   Sodium 135 - 145 mmol/L 133   Potassium 3.5 - 5.1 mmol/L 2.9   Chloride 98 - 111 mmol/L 110   CO2 22 - 32 mmol/L 17   Calcium 8.9 - 10.3 mg/dL 7.7      Imaging/Diagnostic Tests: No new images  NAlen Bleacher MD 09/11/2021, 12:30 PM  PGY-1, [place] pager: [phone] text pages welcome Secure chat group [place]

## 2021-09-11 NOTE — Progress Notes (Signed)
  S:Went to bedside to check on patient, she was sleeping comfortably so I did not wake the patient.   O: BP (!) 121/49   Pulse 78   Temp 98 F (36.7 C) (Oral)   Resp 19   Ht '4\' 9"'$  (1.448 m)   Wt 47 kg   SpO2 96%   BMI 22.42 kg/m   Patient sleeping comfortably, in no acute distress.  A/P: Vitals stable and orders reviewed. Continue plan per day team.  Donney Dice, DO 09/11/2021, 1:14 AM PGY-2, [place] Medicine Service pager [phone]

## 2021-09-11 NOTE — Evaluation (Signed)
Occupational Therapy Evaluation Patient Details Name: Heidi [name] MRN: [id number] DOB: 1940/12/17 Today's Date: 09/11/2021   History of Present Illness 81 y.o. female presenting 6/20 with AKI after bout of vomiting and diarrhea . 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 Pt will benefit from continued acute OT to address the below listed deficits and maximize independence with basic ADLs prior to d/c home. At baseline, pt is independent with basic ADLs, lives with her daughter. Pt currently needs up to min guard assist with LB ADLs, supervision for taking pivotal steps to access BSC and recliner. Daughter present throughout session.        Recommendations for follow up therapy are one component of a multi-disciplinary discharge planning process, led by the attending physician.  Recommendations may be updated based on patient status, additional functional criteria and insurance authorization.   Follow Up Recommendations  No OT follow up    Assistance Recommended at Discharge PRN  Patient can return home with the following Assist for transportation;Assistance with cooking/housework    Functional Status Assessment  Patient has had a recent decline in their functional status and demonstrates the ability to make significant improvements in function in a reasonable and predictable amount of time.  Equipment Recommendations  None recommended by OT    Recommendations for Other Services       Precautions / Restrictions Precautions Precautions: Fall Restrictions Weight Bearing Restrictions: No Other Position/Activity Restrictions: bilateral nephrostomy tubes      Mobility Bed Mobility Overal bed mobility: Modified Independent                   Transfers Overall transfer level: Modified independent Equipment used: None                      Balance Overall balance assessment: Mild deficits observed, not formally tested                                         ADL either performed or assessed with clinical judgement   ADL Overall ADL's : Needs assistance/impaired Eating/Feeding: Set up;Sitting   Grooming: Set up;Sitting;Brushing hair   Upper Body Bathing: Set up;Sitting   Lower Body Bathing: Min guard;Sit to/from stand   Upper Body Dressing : Set up;Sitting   Lower Body Dressing: Min guard;Sit to/from stand   Toilet Transfer: Ambulation;BSC/3in1;Supervision/safety   Toileting- Water quality scientist and Hygiene: Min guard;Sit to/from stand;Set up Eva Manipulation Details (indicate cue type and reason): pt completed pericare in standing with setup     Functional mobility during ADLs: Supervision/safety (in the room) General ADL Comments: Pericare in standing position after bed level cleanup d/t episode of diarrhea. pivotal steps to sit up in recliner for a bit     Vision         Perception     Praxis      Pertinent Vitals/Pain Pain Assessment Pain Assessment: No/denies pain     Hand Dominance     Extremity/Trunk Assessment Upper Extremity Assessment Upper Extremity Assessment: Generalized weakness   Lower Extremity Assessment Lower Extremity Assessment: Defer to PT evaluation       Communication Communication Communication: Prefers language other than [place]  Cognition Arousal/Alertness: Awake/alert Behavior During Therapy: Flat affect Overall Cognitive Status: Within Functional Limits for tasks assessed                                       General Comments  Daughter present throughout session.    Exercises     Shoulder Instructions      Home Living Family/patient expects to be discharged to:: Private  residence Living Arrangements: Children Available Help at Discharge: Family;Available 24 hours/day Type of Home: House Home Access: Level entry     Home Layout: One level     Bathroom Shower/Tub: Teacher, early years/pre: Standard Bathroom Accessibility: Yes   Home Equipment: Conservation officer, nature (2 wheels);Tub bench          Prior Functioning/Environment Prior Level of Function : Independent/Modified Independent                        OT Problem List: Decreased strength;Decreased activity tolerance;Impaired balance (sitting and/or standing);Decreased knowledge of use of DME or AE;Decreased knowledge of precautions;Pain      OT Treatment/Interventions: Self-care/ADL training;Energy conservation;DME and/or AE instruction;Therapeutic activities;Patient/family education;Balance training    OT Goals(Current goals can be found in the care plan section) Acute Rehab OT Goals Patient Stated Goal: not stated OT Goal Formulation: With patient/family Time For Goal Achievement: 09/25/21 Potential to Achieve Goals: Good ADL Goals Pt Will Perform Grooming: with modified independence;standing Pt Will Perform Lower Body Bathing: with modified independence;sit to/from stand Pt Will Perform Lower Body Dressing: with modified independence;sit to/from stand Pt Will Perform Tub/Shower Transfer: with supervision;ambulating;tub bench  OT Frequency: Min 2X/week    Co-evaluation              AM-PAC OT "6 Clicks" Daily Activity     Outcome Measure Help from another person eating meals?: None Help from another person taking care of personal grooming?: None Help from another person toileting, which includes using toliet, bedpan, or urinal?: A Little Help from another person bathing (including washing, rinsing, drying)?: A Little Help from another person to put on and taking off regular upper body clothing?: None Help from another person to put on and taking off regular lower  body clothing?: None 6 Click Score: 22   End of Session    Activity Tolerance: Patient tolerated treatment well Patient left: in chair;with call bell/phone within reach;with family/visitor present  OT Visit Diagnosis: Unsteadiness on feet (R26.81);Muscle weakness (generalized) (M62.81)                Time: 0932-3557 OT Time Calculation (min): 14 min Charges:  OT General Charges $OT Visit: 1 Visit OT Evaluation $OT Eval Moderate Complexity: Kemp, OT Acute Rehabilitation Services Office: [phone]   Hortencia Pilar 09/11/2021, 11:06 AM

## 2021-09-11 NOTE — Progress Notes (Incomplete)
FPTS Brief Progress Note  S:***   O: BP (!) 110/57   Pulse 74   Temp 98.5 F (36.9 C) (Oral)   Resp 19   Ht '4\' 9"'$  (1.448 m)   Wt 103 lb 9.9 oz (47 kg)   SpO2 100%   BMI 22.42 kg/m     A/P: VSS. Continue plan per day team.  - Orders reviewed. Labs for AM ordered, which were adjusted as needed.    Rosezetta Schlatter, MD 09/11/2021, 11:29 PM PGY-1, [place] Night Resident  Please page [phone] with questions.

## 2021-09-12 LAB — BASIC METABOLIC PANEL
Anion gap: 6 (ref 5–15)
BUN: 24 mg/dL — ABNORMAL HIGH (ref 8–23)
CO2: 18 mmol/L — ABNORMAL LOW (ref 22–32)
Calcium: 8.2 mg/dL — ABNORMAL LOW (ref 8.9–10.3)
Chloride: 112 mmol/L — ABNORMAL HIGH (ref 98–111)
Creatinine, Ser: 1.66 mg/dL — ABNORMAL HIGH (ref 0.44–1.00)
GFR, Estimated: 31 mL/min — ABNORMAL LOW (ref >60)
Glucose, Bld: 102 mg/dL — ABNORMAL HIGH (ref 70–99)
Potassium: 3.9 mmol/L (ref 3.5–5.1)
Sodium: 136 mmol/L (ref 135–145)

## 2021-09-12 LAB — GLUCOSE, CAPILLARY
Glucose-Capillary: 94 mg/dL (ref 70–99)
Glucose-Capillary: 170 mg/dL — ABNORMAL HIGH (ref 70–99)

## 2021-09-12 MED ORDER — ONDANSETRON 4 MG PO TBDP: 4.0000 mg | ORAL_TABLET | Freq: Three times a day (TID) | ORAL | 0 refills | Status: AC | PRN

## 2021-09-12 MED ORDER — NUTRISOURCE FIBER PO PACK: 1.0000 | PACK | Freq: Two times a day (BID) | ORAL | 0 refills | Status: AC

## 2021-09-12 MED ORDER — CEPHALEXIN 500 MG PO CAPS: 500.0000 mg | ORAL_CAPSULE | Freq: Two times a day (BID) | ORAL | 0 refills | Status: AC

## 2021-09-12 MED ORDER — ZINC OXIDE 40 % EX OINT: TOPICAL_OINTMENT | CUTANEOUS | 0 refills | Status: AC | PRN

## 2021-09-12 NOTE — TOC Transition Note (Signed)
Transition of Care [facility]) - CM/SW Discharge Note   Patient Details  Name: Heidi [name] MRN: [id number] Date of Birth: [birth year]  [facility] CM/SW Contact:  Tom-Johnson, Hershal Coria, RN Phone Number: 09/12/2021, 2:07 PM   Clinical Narrative:     Patient is scheduled for discharge today. No PT/OT f/u noted. No TOC needs or recommendations noted. Family to transport at discharge. No further TOC needs noted.   Final next level of care: Home/Self Care Barriers to Discharge: Barriers Resolved   Patient Goals and CMS Choice Patient states their goals for this hospitalization and ongoing recovery are:: To return home CMS Medicare.gov Compare Post Acute Care list provided to:: Patient Choice offered to / list presented to : NA  Discharge Placement                Patient to be transferred to facility by: Family      Discharge Plan and Services   Discharge Planning Services: CM Consult Post Acute Care Choice: NA          DME Arranged: N/A DME Agency: NA       HH Arranged: NA HH Agency: NA        Social Determinants of Health (SDOH) Interventions     Readmission Risk Interventions     No data to display

## 2021-10-06 ENCOUNTER — Other Ambulatory Visit (HOSPITAL_COMMUNITY): Payer: Self-pay | Admitting: Interventional Radiology

## 2021-10-06 ENCOUNTER — Ambulatory Visit (HOSPITAL_COMMUNITY)
Admission: RE | Admit: 2021-10-06 | Discharge: 2021-10-06 | Disposition: A | Discharge status: Home / Self Care | Payer: Medicaid Other | Source: Ambulatory Visit | Attending: Interventional Radiology | Admitting: Interventional Radiology

## 2021-10-06 DIAGNOSIS — Z436 Encounter for attention to other artificial openings of urinary tract: Secondary | ICD-10-CM | POA: Insufficient documentation

## 2021-10-06 DIAGNOSIS — N1339 Other hydronephrosis: Secondary | ICD-10-CM

## 2021-10-06 DIAGNOSIS — N1330 Unspecified hydronephrosis: Secondary | ICD-10-CM

## 2021-10-06 HISTORY — PX: IR NEPHROSTOMY EXCHANGE LEFT: IMG6069

## 2021-10-06 HISTORY — PX: IR NEPHROSTOMY EXCHANGE RIGHT: IMG6070

## 2021-10-06 MED ORDER — LIDOCAINE HCL 1 % IJ SOLN
INTRAMUSCULAR | Status: AC
  Filled 2021-10-06: qty 20

## 2021-10-06 MED ORDER — LIDOCAINE HCL (PF) 1 % IJ SOLN
INTRAMUSCULAR | Status: DC | PRN
  Administered 2021-10-06: 10 mL

## 2021-10-06 MED ORDER — IOHEXOL 300 MG/ML  SOLN
100.0000 mL | Freq: Once | INTRAMUSCULAR | Status: AC | PRN
  Administered 2021-10-06: 20 mL

## 2021-11-25 ENCOUNTER — Other Ambulatory Visit (HOSPITAL_COMMUNITY): Payer: Self-pay | Admitting: Interventional Radiology

## 2021-11-25 ENCOUNTER — Ambulatory Visit (HOSPITAL_COMMUNITY)
Admission: RE | Admit: 2021-11-25 | Discharge: 2021-11-25 | Disposition: A | Discharge status: Home / Self Care | Payer: Medicaid Other | Source: Ambulatory Visit | Attending: Interventional Radiology | Admitting: Interventional Radiology

## 2021-11-25 DIAGNOSIS — N1339 Other hydronephrosis: Secondary | ICD-10-CM

## 2021-11-25 DIAGNOSIS — N1330 Unspecified hydronephrosis: Secondary | ICD-10-CM

## 2021-11-25 DIAGNOSIS — Z436 Encounter for attention to other artificial openings of urinary tract: Secondary | ICD-10-CM | POA: Diagnosis present

## 2021-11-25 HISTORY — PX: IR NEPHROSTOMY EXCHANGE LEFT: IMG6069

## 2021-11-25 HISTORY — PX: IR NEPHROSTOMY EXCHANGE RIGHT: IMG6070

## 2021-11-25 MED ORDER — IOHEXOL 300 MG/ML  SOLN
100.0000 mL | Freq: Once | INTRAMUSCULAR | Status: AC | PRN
  Administered 2021-11-25: 20 mL via INTRA_ARTERIAL

## 2021-11-25 MED ORDER — LIDOCAINE HCL 1 % IJ SOLN
INTRAMUSCULAR | Status: AC
  Filled 2021-11-25: qty 20

## 2022-01-27 ENCOUNTER — Other Ambulatory Visit (HOSPITAL_COMMUNITY): Payer: Self-pay | Admitting: Interventional Radiology

## 2022-01-27 ENCOUNTER — Ambulatory Visit (HOSPITAL_COMMUNITY)
Admission: RE | Admit: 2022-01-27 | Discharge: 2022-01-27 | Disposition: A | Discharge status: Home / Self Care | Payer: Medicaid Other | Source: Ambulatory Visit | Attending: Interventional Radiology | Admitting: Interventional Radiology

## 2022-01-27 DIAGNOSIS — Z436 Encounter for attention to other artificial openings of urinary tract: Secondary | ICD-10-CM | POA: Insufficient documentation

## 2022-01-27 DIAGNOSIS — N1330 Unspecified hydronephrosis: Secondary | ICD-10-CM | POA: Insufficient documentation

## 2022-01-27 HISTORY — PX: IR NEPHROSTOMY EXCHANGE RIGHT: IMG6070

## 2022-01-27 HISTORY — PX: IR NEPHROSTOMY EXCHANGE LEFT: IMG6069

## 2022-01-27 MED ORDER — IOHEXOL 300 MG/ML  SOLN
50.0000 mL | Freq: Once | INTRAMUSCULAR | Status: AC | PRN
  Administered 2022-01-27: 20 mL

## 2022-01-27 MED ORDER — LIDOCAINE HCL 1 % IJ SOLN
INTRAMUSCULAR | Status: AC
  Administered 2022-01-27: 10 mL
  Filled 2022-01-27: qty 20

## 2022-01-27 NOTE — Procedures (Signed)
Vascular and Interventional Radiology Procedure Note  Patient: Heidi [name] DOB: 1940-08-25 Medical Record Number: [id number] Note Date/Time: 01/27/22 4:28 PM   Performing Physician: Michaelle Birks, MD Assistant(s): None  Diagnosis: Routine exchange.  Procedure:  BILATERAL NEPHROSTOMY TUBE EXCHANGE BILATERAL ANTEROGRADE NEPHROSTOGRAM  Anesthesia: Local Anesthetic Complications: None Estimated Blood Loss:  0 mL Specimens:  None  Findings:  Successful exchange for a bilateral, 12 F nephrostomy tube(s).   Plan: Resume previous  care. Follow up for routine nephrostomy tube exchange in 8 week(s).   See detailed procedure note with images in PACS. The patient tolerated the procedure well without incident or complication and was returned to Recovery in stable condition.    Michaelle Birks, MD Vascular and Interventional Radiology Specialists [facility]   Pager. [phone]

## 2022-03-24 ENCOUNTER — Other Ambulatory Visit (HOSPITAL_COMMUNITY): Payer: Medicaid Other

## 2022-03-31 ENCOUNTER — Other Ambulatory Visit (HOSPITAL_COMMUNITY): Payer: Self-pay | Admitting: Interventional Radiology

## 2022-03-31 ENCOUNTER — Ambulatory Visit (HOSPITAL_COMMUNITY)
Admission: RE | Admit: 2022-03-31 | Discharge: 2022-03-31 | Disposition: A | Discharge status: Home / Self Care | Payer: Medicaid Other | Source: Ambulatory Visit | Attending: Interventional Radiology | Admitting: Interventional Radiology

## 2022-03-31 DIAGNOSIS — N1330 Unspecified hydronephrosis: Secondary | ICD-10-CM

## 2022-03-31 DIAGNOSIS — Z436 Encounter for attention to other artificial openings of urinary tract: Secondary | ICD-10-CM | POA: Diagnosis present

## 2022-03-31 HISTORY — PX: IR NEPHROSTOMY EXCHANGE LEFT: IMG6069

## 2022-03-31 HISTORY — PX: IR NEPHROSTOMY EXCHANGE RIGHT: IMG6070

## 2022-03-31 MED ORDER — IOHEXOL 300 MG/ML  SOLN: 50.0000 mL | Freq: Once | INTRAMUSCULAR | Status: DC | PRN

## 2022-03-31 MED ORDER — IOHEXOL 300 MG/ML  SOLN
50.0000 mL | Freq: Once | INTRAMUSCULAR | Status: AC | PRN
  Administered 2022-03-31: 12 mL

## 2022-03-31 MED ORDER — LIDOCAINE HCL 1 % IJ SOLN
INTRAMUSCULAR | Status: AC
  Administered 2022-03-31: 10 mL
  Filled 2022-03-31: qty 20

## 2022-03-31 NOTE — Procedures (Signed)
Interventional Radiology Procedure:   Indications: Routine exchange of nephrostomy tubes  Procedure: Bilateral nephrostomy tube exchange  Findings: New 12 Fr tubes placed bilaterally  Complications: No immediate complications noted.     EBL: Minimal  Plan: Plan for routine exchanges.   Heidi [name] R. Anselm Pancoast, MD  Pager: [phone]

## 2022-05-26 ENCOUNTER — Ambulatory Visit (HOSPITAL_COMMUNITY)
Admission: RE | Admit: 2022-05-26 | Discharge: 2022-05-26 | Disposition: A | Discharge status: Home / Self Care | Payer: Medicaid Other | Source: Ambulatory Visit | Attending: Interventional Radiology | Admitting: Interventional Radiology

## 2022-05-26 ENCOUNTER — Other Ambulatory Visit (HOSPITAL_COMMUNITY): Payer: Self-pay | Admitting: Interventional Radiology

## 2022-05-26 DIAGNOSIS — Z436 Encounter for attention to other artificial openings of urinary tract: Secondary | ICD-10-CM | POA: Insufficient documentation

## 2022-05-26 DIAGNOSIS — N1330 Unspecified hydronephrosis: Secondary | ICD-10-CM

## 2022-05-26 HISTORY — PX: IR NEPHROSTOMY EXCHANGE RIGHT: IMG6070

## 2022-05-26 HISTORY — PX: IR NEPHROSTOMY EXCHANGE LEFT: IMG6069

## 2022-05-26 MED ORDER — LIDOCAINE-EPINEPHRINE 1 %-1:100000 IJ SOLN
INTRAMUSCULAR | Status: AC
  Administered 2022-05-26: 15 mL
  Filled 2022-05-26: qty 1

## 2022-05-26 MED ORDER — IOHEXOL 300 MG/ML  SOLN
50.0000 mL | Freq: Once | INTRAMUSCULAR | Status: AC | PRN
  Administered 2022-05-26: 20 mL

## 2022-07-21 ENCOUNTER — Ambulatory Visit (HOSPITAL_COMMUNITY)
Admission: RE | Admit: 2022-07-21 | Discharge: 2022-07-21 | Disposition: A | Discharge status: Home / Self Care | Payer: Medicaid Other | Source: Ambulatory Visit | Attending: Interventional Radiology | Admitting: Interventional Radiology

## 2022-07-21 ENCOUNTER — Other Ambulatory Visit (HOSPITAL_COMMUNITY): Payer: Self-pay | Admitting: Interventional Radiology

## 2022-07-21 DIAGNOSIS — Z8541 Personal history of malignant neoplasm of cervix uteri: Secondary | ICD-10-CM | POA: Diagnosis not present

## 2022-07-21 DIAGNOSIS — N1330 Unspecified hydronephrosis: Secondary | ICD-10-CM | POA: Insufficient documentation

## 2022-07-21 DIAGNOSIS — Z436 Encounter for attention to other artificial openings of urinary tract: Secondary | ICD-10-CM | POA: Insufficient documentation

## 2022-07-21 HISTORY — PX: IR NEPHROSTOMY EXCHANGE LEFT: IMG6069

## 2022-07-21 HISTORY — PX: IR NEPHROSTOMY EXCHANGE RIGHT: IMG6070

## 2022-07-21 MED ORDER — LIDOCAINE HCL 1 % IJ SOLN
INTRAMUSCULAR | Status: AC
  Filled 2022-07-21: qty 20

## 2022-07-21 MED ORDER — LIDOCAINE HCL (PF) 1 % IJ SOLN: 10.0000 mL | Freq: Once | INTRAMUSCULAR | Status: DC

## 2022-07-21 MED ORDER — IOHEXOL 300 MG/ML  SOLN
50.0000 mL | Freq: Once | INTRAMUSCULAR | Status: AC | PRN
  Administered 2022-07-21: 20 mL

## 2022-07-21 NOTE — Procedures (Signed)
Vascular and Interventional Radiology Procedure Note  Patient: Heidi [name] DOB: May 16, 1940 Medical Record Number: [id number] Note Date/Time: 07/21/22 12:31 PM   Performing Physician: Roanna Banning, MD Assistant(s): None  Diagnosis: Routine exchange. and Hx of cervical CA  Procedure:  BILATERAL NEPHROSTOMY TUBE EXCHANGE BILATERAL ANTEROGRADE NEPHROSTOGRAM  Anesthesia: Local Anesthetic Complications: None Estimated Blood Loss: Minimal Specimens:  None  Findings:  Successful exchange for a bilateral, 12 F nephrostomy tubes into the kidney(s).   Plan: Resume previous  care. Follow up for routine nephrostomy tube exchange in 8 week(s).   See detailed procedure note with images in PACS. The patient tolerated the procedure well without incident or complication and was returned to Recovery in stable condition.    Roanna Banning, MD Vascular and Interventional Radiology Specialists [facility]   Pager. [phone] Clinic. [phone]

## 2022-09-15 ENCOUNTER — Other Ambulatory Visit (HOSPITAL_COMMUNITY): Payer: Self-pay | Admitting: Interventional Radiology

## 2022-09-15 ENCOUNTER — Ambulatory Visit (HOSPITAL_COMMUNITY)
Admission: RE | Admit: 2022-09-15 | Discharge: 2022-09-15 | Disposition: A | Discharge status: Home / Self Care | Payer: Medicaid Other | Source: Ambulatory Visit | Attending: Interventional Radiology | Admitting: Interventional Radiology

## 2022-09-15 DIAGNOSIS — Z8541 Personal history of malignant neoplasm of cervix uteri: Secondary | ICD-10-CM | POA: Diagnosis not present

## 2022-09-15 DIAGNOSIS — N1330 Unspecified hydronephrosis: Secondary | ICD-10-CM

## 2022-09-15 DIAGNOSIS — Z436 Encounter for attention to other artificial openings of urinary tract: Secondary | ICD-10-CM | POA: Diagnosis not present

## 2022-09-15 HISTORY — PX: IR NEPHROSTOMY EXCHANGE LEFT: IMG6069

## 2022-09-15 HISTORY — PX: IR NEPHROSTOMY EXCHANGE RIGHT: IMG6070

## 2022-09-15 MED ORDER — LIDOCAINE HCL 1 % IJ SOLN: 20.0000 mL | Freq: Once | INTRAMUSCULAR | Status: DC

## 2022-09-15 MED ORDER — LIDOCAINE HCL 1 % IJ SOLN
INTRAMUSCULAR | Status: AC
  Filled 2022-09-15: qty 20

## 2022-09-15 MED ORDER — IOHEXOL 300 MG/ML  SOLN
50.0000 mL | Freq: Once | INTRAMUSCULAR | Status: AC | PRN
  Administered 2022-09-15: 15 mL

## 2022-09-15 NOTE — Procedures (Signed)
Vascular and Interventional Radiology Procedure Note  Patient: Heidi [name] DOB: 1940-08-17 Medical Record Number: [id number] Note Date/Time: 09/15/22 2:00 PM   Performing Physician: Roanna Banning, MD Assistant(s): None  Diagnosis: Routine exchange. and Hx of cervical CA   Procedure:  BILATERAL NEPHROSTOMY TUBE EXCHANGE BILATERAL ANTEROGRADE NEPHROSTOGRAM   Anesthesia: Local Anesthetic Complications: None Estimated Blood Loss: Minimal Specimens:  None   Findings:  Successful exchange for a bilateral, 12 F nephrostomy tubes into the kidney(s).    Plan: Resume previous  care. Follow up for routine nephrostomy tube exchange in 8 week(s).   See detailed procedure note with images in PACS. The patient tolerated the procedure well without incident or complication and was returned to Recovery in stable condition.    Roanna Banning, MD [facility]   Pager. [phone] Clinic. [phone]

## 2022-11-10 ENCOUNTER — Other Ambulatory Visit (HOSPITAL_COMMUNITY): Payer: Self-pay | Admitting: Interventional Radiology

## 2022-11-10 ENCOUNTER — Ambulatory Visit (HOSPITAL_COMMUNITY)
Admission: RE | Admit: 2022-11-10 | Discharge: 2022-11-10 | Disposition: A | Discharge status: Home / Self Care | Payer: Medicaid Other | Source: Ambulatory Visit | Attending: Interventional Radiology | Admitting: Interventional Radiology

## 2022-11-10 DIAGNOSIS — Z436 Encounter for attention to other artificial openings of urinary tract: Secondary | ICD-10-CM | POA: Diagnosis present

## 2022-11-10 DIAGNOSIS — N1330 Unspecified hydronephrosis: Secondary | ICD-10-CM | POA: Diagnosis not present

## 2022-11-10 HISTORY — PX: IR NEPHROSTOMY EXCHANGE RIGHT: IMG6070

## 2022-11-10 MED ORDER — LIDOCAINE HCL 1 % IJ SOLN
INTRAMUSCULAR | Status: AC
  Filled 2022-11-10: qty 20

## 2022-11-10 MED ORDER — IOHEXOL 300 MG/ML  SOLN
50.0000 mL | Freq: Once | INTRAMUSCULAR | Status: AC | PRN
  Administered 2022-11-10: 20 mL

## 2022-11-10 NOTE — Procedures (Signed)
Interventional Radiology Procedure Note  Procedure:   Image guided bilateral PCN exchange, routine.  New 57F drains. To gravity   Complications: None Recommendations:  - To gravity - Do not submerge - Routine exchanges   Signed,  Yvone Neu. Loreta Ave, DO

## 2023-01-05 ENCOUNTER — Ambulatory Visit (HOSPITAL_COMMUNITY)
Admission: RE | Admit: 2023-01-05 | Discharge: 2023-01-05 | Disposition: A | Discharge status: Home / Self Care | Payer: Medicaid Other | Source: Ambulatory Visit | Attending: Interventional Radiology

## 2023-01-05 ENCOUNTER — Other Ambulatory Visit (HOSPITAL_COMMUNITY): Payer: Self-pay | Admitting: Interventional Radiology

## 2023-01-05 ENCOUNTER — Encounter (HOSPITAL_COMMUNITY): Payer: Self-pay | Admitting: Interventional Radiology

## 2023-01-05 DIAGNOSIS — N1330 Unspecified hydronephrosis: Secondary | ICD-10-CM

## 2023-01-05 DIAGNOSIS — Z436 Encounter for attention to other artificial openings of urinary tract: Secondary | ICD-10-CM | POA: Insufficient documentation

## 2023-01-05 HISTORY — PX: IR NEPHROSTOMY EXCHANGE RIGHT: IMG6070

## 2023-01-05 HISTORY — PX: IR NEPHROSTOMY EXCHANGE LEFT: IMG6069

## 2023-01-05 MED ORDER — IOHEXOL 300 MG/ML  SOLN
50.0000 mL | Freq: Once | INTRAMUSCULAR | Status: AC | PRN
  Administered 2023-01-05: 15 mL

## 2023-01-05 MED ORDER — LIDOCAINE-EPINEPHRINE 1 %-1:100000 IJ SOLN
20.0000 mL | Freq: Once | INTRAMUSCULAR | Status: AC
  Administered 2023-01-05: 10 mL via INTRADERMAL

## 2023-01-05 MED ORDER — LIDOCAINE-EPINEPHRINE 1 %-1:100000 IJ SOLN
INTRAMUSCULAR | Status: AC
  Filled 2023-01-05: qty 1

## 2023-03-02 ENCOUNTER — Other Ambulatory Visit (HOSPITAL_COMMUNITY): Payer: Self-pay | Admitting: Interventional Radiology

## 2023-03-02 ENCOUNTER — Ambulatory Visit (HOSPITAL_COMMUNITY)
Admission: RE | Admit: 2023-03-02 | Discharge: 2023-03-02 | Disposition: A | Discharge status: Home / Self Care | Payer: Medicaid Other | Source: Ambulatory Visit | Attending: Interventional Radiology | Admitting: Interventional Radiology

## 2023-03-02 DIAGNOSIS — N1330 Unspecified hydronephrosis: Secondary | ICD-10-CM | POA: Diagnosis present

## 2023-03-02 HISTORY — PX: IR NEPHROSTOMY EXCHANGE LEFT: IMG6069

## 2023-03-02 HISTORY — PX: IR NEPHROSTOMY EXCHANGE RIGHT: IMG6070

## 2023-03-02 MED ORDER — LIDOCAINE-EPINEPHRINE (PF) 1 %-1:200000 IJ SOLN
20.0000 mL | Freq: Once | INTRAMUSCULAR | Status: AC
  Administered 2023-03-02: 10 mL

## 2023-03-02 MED ORDER — IOHEXOL 300 MG/ML  SOLN
50.0000 mL | Freq: Once | INTRAMUSCULAR | Status: AC | PRN
  Administered 2023-03-02: 20 mL

## 2023-03-02 MED ORDER — LIDOCAINE-EPINEPHRINE 1 %-1:100000 IJ SOLN
INTRAMUSCULAR | Status: AC
  Filled 2023-03-02: qty 1

## 2023-03-07 ENCOUNTER — Encounter (HOSPITAL_COMMUNITY): Payer: Self-pay | Admitting: Radiology

## 2023-04-26 ENCOUNTER — Other Ambulatory Visit: Payer: Self-pay | Admitting: Radiology

## 2023-04-26 MED ORDER — VANCOMYCIN HCL 10 G IV SOLR
2000.0000 mg | INTRAVENOUS | Status: AC
Start: 2023-04-26 — End: 2023-04-27

## 2023-04-26 MED ORDER — SODIUM CHLORIDE 0.9 % IV SOLN
INTRAVENOUS | Status: AC
Start: 2023-04-27 — End: 2023-04-27

## 2023-04-27 ENCOUNTER — Other Ambulatory Visit (HOSPITAL_COMMUNITY): Payer: Self-pay | Admitting: Interventional Radiology

## 2023-04-27 ENCOUNTER — Ambulatory Visit (HOSPITAL_COMMUNITY)
Admission: RE | Admit: 2023-04-27 | Discharge: 2023-04-27 | Disposition: A | Discharge status: Home / Self Care | Payer: Medicaid Other | Source: Ambulatory Visit | Attending: Interventional Radiology | Admitting: Interventional Radiology

## 2023-04-27 DIAGNOSIS — N1330 Unspecified hydronephrosis: Secondary | ICD-10-CM

## 2023-04-27 DIAGNOSIS — Z436 Encounter for attention to other artificial openings of urinary tract: Secondary | ICD-10-CM | POA: Insufficient documentation

## 2023-04-27 HISTORY — PX: IR NEPHROSTOMY EXCHANGE LEFT: IMG6069

## 2023-04-27 MED ORDER — LIDOCAINE-EPINEPHRINE 1 %-1:100000 IJ SOLN
20.0000 mL | Freq: Once | INTRAMUSCULAR | Status: AC
  Administered 2023-04-27: 9 mL via INTRADERMAL

## 2023-04-27 MED ORDER — IOHEXOL 300 MG/ML  SOLN
50.0000 mL | Freq: Once | INTRAMUSCULAR | Status: AC | PRN
  Administered 2023-04-27: 12 mL

## 2023-04-27 MED ORDER — LIDOCAINE-EPINEPHRINE 1 %-1:100000 IJ SOLN
INTRAMUSCULAR | Status: AC
  Filled 2023-04-27: qty 1

## 2023-06-22 ENCOUNTER — Other Ambulatory Visit: Payer: Self-pay | Admitting: Radiology

## 2023-06-23 ENCOUNTER — Other Ambulatory Visit (HOSPITAL_COMMUNITY): Payer: Self-pay | Admitting: Interventional Radiology

## 2023-06-23 ENCOUNTER — Ambulatory Visit (HOSPITAL_COMMUNITY)
Admission: RE | Admit: 2023-06-23 | Discharge: 2023-06-23 | Disposition: A | Discharge status: Home / Self Care | Payer: Medicaid Other | Source: Ambulatory Visit | Attending: Interventional Radiology | Admitting: Interventional Radiology

## 2023-06-23 DIAGNOSIS — N1330 Unspecified hydronephrosis: Secondary | ICD-10-CM

## 2023-06-23 DIAGNOSIS — Z436 Encounter for attention to other artificial openings of urinary tract: Secondary | ICD-10-CM | POA: Diagnosis present

## 2023-06-23 HISTORY — PX: IR NEPHROSTOMY EXCHANGE LEFT: IMG6069

## 2023-06-23 MED ORDER — IOHEXOL 300 MG/ML  SOLN
100.0000 mL | Freq: Once | INTRAMUSCULAR | Status: AC | PRN
  Administered 2023-06-23: 15 mL

## 2023-06-23 MED ORDER — LIDOCAINE HCL (PF) 1 % IJ SOLN
30.0000 mL | Freq: Once | INTRAMUSCULAR | Status: AC
  Administered 2023-06-23: 10 mL

## 2023-06-23 MED ORDER — LIDOCAINE HCL 1 % IJ SOLN
INTRAMUSCULAR | Status: AC
  Filled 2023-06-23: qty 20

## 2023-06-23 NOTE — Procedures (Signed)
 Interventional Radiology Procedure Note  Procedure: Image guided routine bil PCN exchange.  Bil 12F drains  Complications: None     Recommendations: - Routine drain care,     - routine exchanges  Signed,  Yvone Neu. Loreta Ave, DO

## 2023-08-17 ENCOUNTER — Other Ambulatory Visit: Payer: Self-pay | Admitting: Radiology

## 2023-08-18 ENCOUNTER — Ambulatory Visit (HOSPITAL_COMMUNITY)
Admission: RE | Admit: 2023-08-18 | Discharge: 2023-08-18 | Disposition: A | Discharge status: Home / Self Care | Source: Ambulatory Visit | Attending: Interventional Radiology | Admitting: Interventional Radiology

## 2023-08-18 ENCOUNTER — Other Ambulatory Visit (HOSPITAL_COMMUNITY): Payer: Self-pay | Admitting: Interventional Radiology

## 2023-08-18 DIAGNOSIS — N1330 Unspecified hydronephrosis: Secondary | ICD-10-CM | POA: Diagnosis present

## 2023-08-18 HISTORY — PX: IR NEPHROSTOMY EXCHANGE LEFT: IMG6069

## 2023-08-18 MED ORDER — IOHEXOL 300 MG/ML  SOLN
50.0000 mL | Freq: Once | INTRAMUSCULAR | Status: AC | PRN
  Administered 2023-08-18: 10 mL

## 2023-08-18 MED ORDER — LIDOCAINE-EPINEPHRINE 1 %-1:100000 IJ SOLN
INTRAMUSCULAR | Status: AC
  Filled 2023-08-18: qty 1

## 2023-08-18 MED ORDER — LIDOCAINE-EPINEPHRINE 1 %-1:100000 IJ SOLN
20.0000 mL | Freq: Once | INTRAMUSCULAR | Status: AC
  Administered 2023-08-18: 10 mL

## 2023-08-18 NOTE — Procedures (Signed)
 Interventional Radiology Procedure Note   Procedure: Image guided routine bil PCN exchange.  Bil 32F drains   Complications: None       Recommendations: - Routine drain care,     - routine exchanges  Zettie Hillock, MD Pager #: [phone]

## 2023-10-13 ENCOUNTER — Other Ambulatory Visit (HOSPITAL_COMMUNITY): Payer: Self-pay | Admitting: Interventional Radiology

## 2023-10-13 ENCOUNTER — Ambulatory Visit (HOSPITAL_COMMUNITY)
Admission: RE | Admit: 2023-10-13 | Discharge: 2023-10-13 | Disposition: A | Discharge status: Home / Self Care | Source: Ambulatory Visit | Attending: Interventional Radiology | Admitting: Interventional Radiology

## 2023-10-13 DIAGNOSIS — N1330 Unspecified hydronephrosis: Secondary | ICD-10-CM

## 2023-10-13 DIAGNOSIS — Z436 Encounter for attention to other artificial openings of urinary tract: Secondary | ICD-10-CM | POA: Insufficient documentation

## 2023-10-13 HISTORY — PX: IR NEPHROSTOMY EXCHANGE LEFT: IMG6069

## 2023-10-13 MED ORDER — IOHEXOL 300 MG/ML  SOLN
50.0000 mL | Freq: Once | INTRAMUSCULAR | Status: AC | PRN
  Administered 2023-10-13: 15 mL

## 2023-10-13 MED ORDER — LIDOCAINE HCL 1 % IJ SOLN
20.0000 mL | Freq: Once | INTRAMUSCULAR | Status: AC
  Administered 2023-10-13: 10 mL via INTRADERMAL

## 2023-10-13 MED ORDER — LIDOCAINE HCL 1 % IJ SOLN
INTRAMUSCULAR | Status: AC
  Filled 2023-10-13: qty 20

## 2023-11-10 IMAGING — XA IR EXCHANGE NEPHROSTOMY LEFT
1 series · 4 of 4 positions shown · non-contrast
Comparison: None.

INDICATION: Routine exchange

EXAM:
FLUOROSCOPIC GUIDED BILATERAL SIDED NEPHROSTOMY CATHETER EXCHANGE
TECHNIQUE: Informed written consent was obtained from the patient after a
discussion of the risks, benefits and alternatives to treatment.
Questions regarding the procedure were encouraged and answered. A
timeout was performed prior to the initiation of the procedure.

[Series 300: ir nephrostomy exchange left · 4 of 4 slices shown]
[im 1/4]
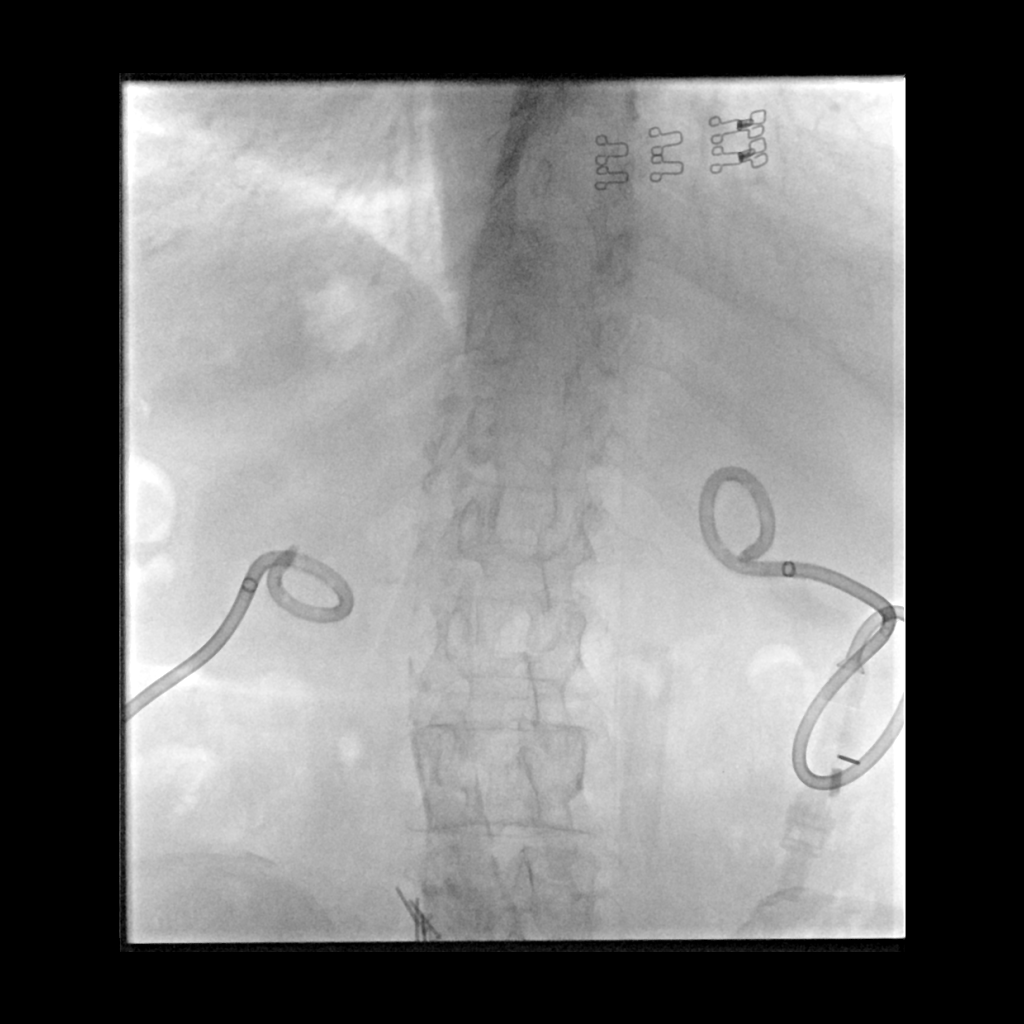
[im 2/4]
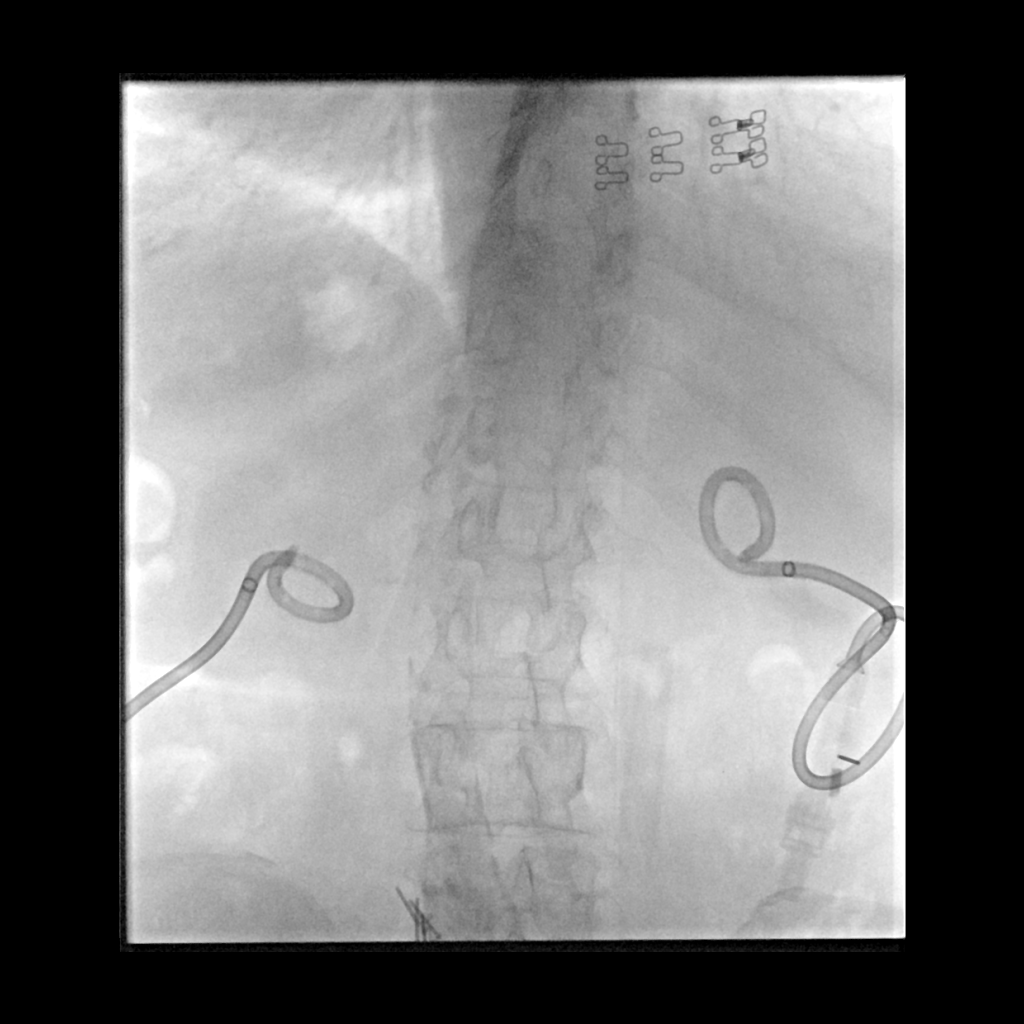
[im 3/4]
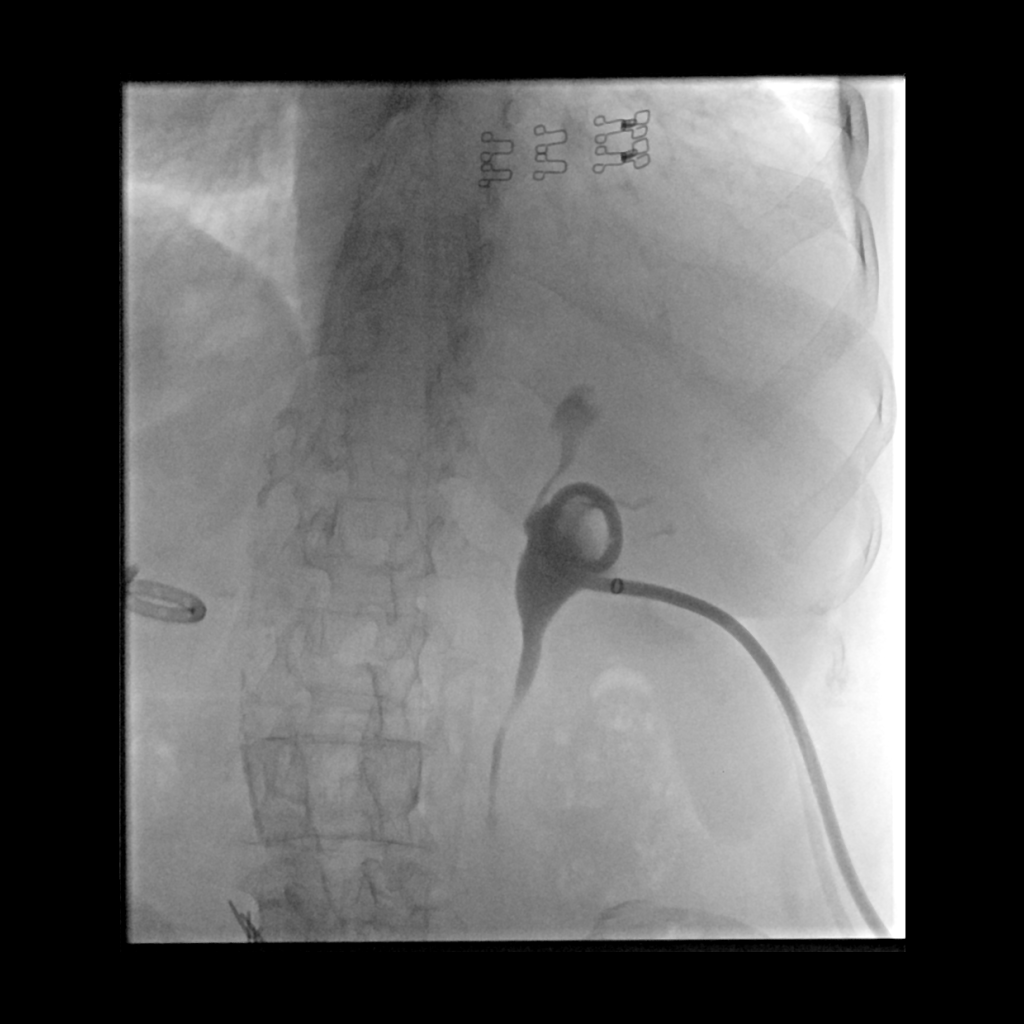
[im 4/4]
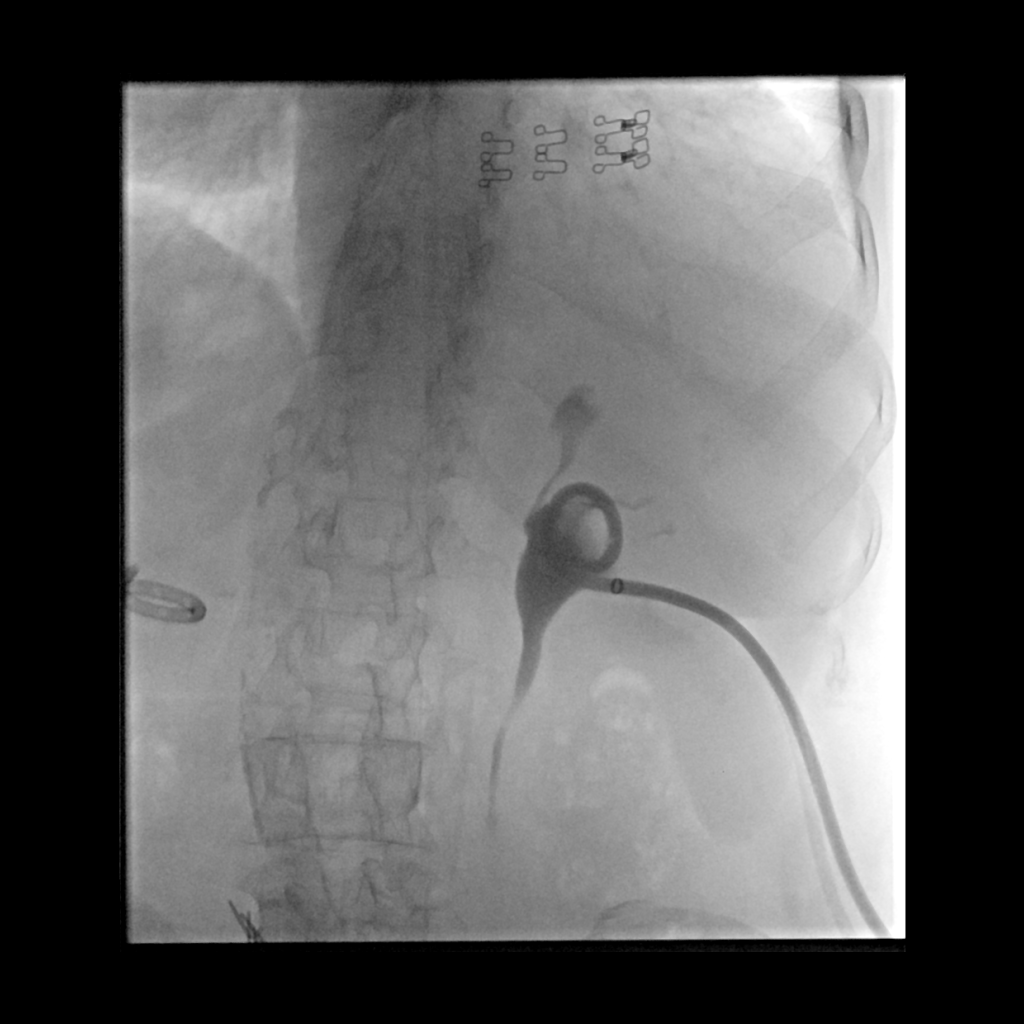

[4 of 4 positions shown; findings below may reference images not displayed]

CONTRAST:  A total of 15 mL Omnipaque 300 was administered was
administered into both collecting systems

FLUOROSCOPY TIME:  36 seconds (5 mGy)

COMPLICATIONS:
None immediate.
The bilateral flanks and external portions of existing nephrostomy
catheters were prepped and draped in the usual sterile fashion. A
sterile drape was applied covering the operative field. Maximum
barrier sterile technique with sterile gowns and gloves were used
for the procedure. A timeout was performed prior to the initiation
of the procedure.

A pre procedural spot fluoroscopic image was obtained. Beginning
with the left-sided nephrostomy, a small amount of contrast was
injected via the existing left-sided nephrostomy catheter
demonstrating appropriate positioning within the renal pelvis. The
existing nephrostomy catheter was cut and cannulated with a Benson
wire which was coiled within the renal pelvis. Under intermittent
fluoroscopic guidance, the existing nephrostomy catheter was
exchanged for a new 12 French all-purpose drainage catheter. Limited
contrast injection confirmed appropriate positioning within the left
renal pelvis and a post exchange fluoroscopic image was obtained.
The catheter was locked and reconnected to a gravity bag.

The identical repeat procedure was repeated for the contralateral
right-sided nephrostomy, ultimately allowing successful exchange of
a new 12 French all-purpose drainage catheter with end coiled and
locked within the right renal pelvis.

Dressings were placed. The patient tolerated the above procedures
well without immediate postprocedural complication.
FINDINGS: The existing nephrostomy catheters are appropriately positioned and
functioning.

After successful fluoroscopic guided exchange, new bilateral 12
French nephrostomy catheters are coiled and locked within the
respective renal pelvises.
IMPRESSION: Successful fluoroscopic guided exchange of bilateral 12 French
percutaneous nephrostomy catheters.

## 2023-12-14 ENCOUNTER — Ambulatory Visit (HOSPITAL_COMMUNITY)
Admission: RE | Admit: 2023-12-14 | Discharge: 2023-12-14 | Disposition: A | Discharge status: Home / Self Care | Source: Ambulatory Visit | Attending: Interventional Radiology | Admitting: Interventional Radiology

## 2023-12-14 ENCOUNTER — Other Ambulatory Visit (HOSPITAL_COMMUNITY): Payer: Self-pay | Admitting: Interventional Radiology

## 2023-12-14 DIAGNOSIS — Z8542 Personal history of malignant neoplasm of other parts of uterus: Secondary | ICD-10-CM | POA: Insufficient documentation

## 2023-12-14 DIAGNOSIS — N1330 Unspecified hydronephrosis: Secondary | ICD-10-CM | POA: Diagnosis present

## 2023-12-14 HISTORY — PX: IR NEPHROSTOMY EXCHANGE LEFT: IMG6069

## 2023-12-14 MED ORDER — IOHEXOL 300 MG/ML  SOLN
50.0000 mL | Freq: Once | INTRAMUSCULAR | Status: AC | PRN
  Administered 2023-12-14: 15 mL

## 2023-12-14 MED ORDER — LIDOCAINE-EPINEPHRINE 1 %-1:100000 IJ SOLN
INTRAMUSCULAR | Status: AC
  Filled 2023-12-14: qty 1

## 2023-12-14 MED ORDER — LIDOCAINE-EPINEPHRINE 1 %-1:100000 IJ SOLN
20.0000 mL | Freq: Once | INTRAMUSCULAR | Status: AC
  Administered 2023-12-14: 6 mL via INTRADERMAL

## 2024-02-08 ENCOUNTER — Ambulatory Visit (HOSPITAL_COMMUNITY)
Admission: RE | Admit: 2024-02-08 | Discharge: 2024-02-08 | Disposition: A | Discharge status: Home / Self Care | Source: Ambulatory Visit | Attending: Interventional Radiology | Admitting: Interventional Radiology

## 2024-02-08 ENCOUNTER — Other Ambulatory Visit (HOSPITAL_COMMUNITY): Payer: Self-pay | Admitting: Interventional Radiology

## 2024-02-08 DIAGNOSIS — N1330 Unspecified hydronephrosis: Secondary | ICD-10-CM | POA: Diagnosis present

## 2024-02-08 HISTORY — PX: IR NEPHROSTOMY EXCHANGE LEFT: IMG6069

## 2024-02-08 MED ORDER — LIDOCAINE-EPINEPHRINE 1 %-1:100000 IJ SOLN: 20.0000 mL | Freq: Once | INTRAMUSCULAR | Status: DC

## 2024-02-08 MED ORDER — IOHEXOL 300 MG/ML  SOLN: 50.0000 mL | Freq: Once | INTRAMUSCULAR | Status: DC | PRN

## 2024-02-08 MED ORDER — LIDOCAINE-EPINEPHRINE 1 %-1:100000 IJ SOLN
INTRAMUSCULAR | Status: AC
  Filled 2024-02-08: qty 1

## 2024-02-08 NOTE — Procedures (Signed)
 Interventional Radiology Procedure Note  Procedure: fluoro bilateral 12 fr pcns    Complications: None  Estimated Blood Loss:  0  Findings: Full report in pacs     EMERSON FREDERIC SPECKING, MD

## 2024-04-04 ENCOUNTER — Ambulatory Visit (HOSPITAL_COMMUNITY)
Admission: RE | Admit: 2024-04-04 | Discharge: 2024-04-04 | Disposition: A | Source: Ambulatory Visit | Attending: Interventional Radiology | Admitting: Interventional Radiology

## 2024-04-04 ENCOUNTER — Other Ambulatory Visit (HOSPITAL_COMMUNITY): Payer: Self-pay | Admitting: Interventional Radiology

## 2024-04-04 DIAGNOSIS — Z8542 Personal history of malignant neoplasm of other parts of uterus: Secondary | ICD-10-CM | POA: Diagnosis not present

## 2024-04-04 DIAGNOSIS — N1330 Unspecified hydronephrosis: Secondary | ICD-10-CM

## 2024-04-04 HISTORY — PX: IR NEPHROSTOMY EXCHANGE LEFT: IMG6069

## 2024-04-04 MED ORDER — IOHEXOL 300 MG/ML  SOLN
100.0000 mL | Freq: Once | INTRAMUSCULAR | Status: AC | PRN
  Administered 2024-04-04: 25 mL

## 2024-04-04 MED ORDER — LIDOCAINE HCL 1 % IJ SOLN
20.0000 mL | Freq: Once | INTRAMUSCULAR | Status: AC
  Administered 2024-04-04: 10 mL

## 2024-04-04 MED ORDER — LIDOCAINE HCL 1 % IJ SOLN
INTRAMUSCULAR | Status: AC
  Filled 2024-04-04: qty 20

## 2024-04-04 NOTE — Procedures (Signed)
 Interventional Radiology Procedure:   Indications: Bilateral nephrostomy tubes  Procedure: Exchange of bilateral nephrostomy tubes  Findings: New 10 Fr tubes placed bilaterally.   Complications: None     EBL: Minimal  Plan: Routine exchanges   Skyann Ganim R. Philip, MD  Pager: [phone]

## 2024-06-02 ENCOUNTER — Other Ambulatory Visit (HOSPITAL_COMMUNITY)
